# Patient Record
Sex: Male | Born: 1955 | Race: White | Hispanic: No | Smoking: Never smoker
Health system: Southern US, Community
[De-identification: ages and names within clinical notes are randomized; demographics above are authoritative.]

## PROBLEM LIST (undated history)

## (undated) DIAGNOSIS — N529 Male erectile dysfunction, unspecified: Secondary | ICD-10-CM

## (undated) DIAGNOSIS — R310 Gross hematuria: Secondary | ICD-10-CM

## (undated) DIAGNOSIS — E669 Obesity, unspecified: Secondary | ICD-10-CM

## (undated) DIAGNOSIS — I10 Essential (primary) hypertension: Secondary | ICD-10-CM

## (undated) DIAGNOSIS — D126 Benign neoplasm of colon, unspecified: Secondary | ICD-10-CM

## (undated) DIAGNOSIS — R972 Elevated prostate specific antigen [PSA]: Secondary | ICD-10-CM

## (undated) DIAGNOSIS — I2510 Atherosclerotic heart disease of native coronary artery without angina pectoris: Secondary | ICD-10-CM

## (undated) DIAGNOSIS — E785 Hyperlipidemia, unspecified: Secondary | ICD-10-CM

## (undated) DIAGNOSIS — I480 Paroxysmal atrial fibrillation: Secondary | ICD-10-CM

## (undated) DIAGNOSIS — I214 Non-ST elevation (NSTEMI) myocardial infarction: Secondary | ICD-10-CM

## (undated) DIAGNOSIS — M720 Palmar fascial fibromatosis [Dupuytren]: Secondary | ICD-10-CM

## (undated) DIAGNOSIS — E119 Type 2 diabetes mellitus without complications: Secondary | ICD-10-CM

## (undated) DIAGNOSIS — F419 Anxiety disorder, unspecified: Secondary | ICD-10-CM

## (undated) DIAGNOSIS — M65319 Trigger thumb, unspecified thumb: Secondary | ICD-10-CM

## (undated) DIAGNOSIS — Z1211 Encounter for screening for malignant neoplasm of colon: Secondary | ICD-10-CM

## (undated) DIAGNOSIS — N182 Chronic kidney disease, stage 2 (mild): Secondary | ICD-10-CM

## (undated) DIAGNOSIS — E1142 Type 2 diabetes mellitus with diabetic polyneuropathy: Secondary | ICD-10-CM

## (undated) DIAGNOSIS — E66811 Obesity, class 1: Secondary | ICD-10-CM

## (undated) DIAGNOSIS — E118 Type 2 diabetes mellitus with unspecified complications: Secondary | ICD-10-CM

## (undated) HISTORY — DX: Paroxysmal atrial fibrillation: I48.0

## (undated) HISTORY — DX: Obesity, class 1: E66.811

## (undated) HISTORY — DX: Anxiety disorder, unspecified: F41.9

## (undated) HISTORY — DX: Type 2 diabetes mellitus with diabetic polyneuropathy: E11.42

## (undated) HISTORY — DX: Essential (primary) hypertension: I10

## (undated) HISTORY — DX: Atherosclerotic heart disease of native coronary artery without angina pectoris: I25.10

## (undated) HISTORY — PX: TRANSTHORACIC ECHOCARDIOGRAM: SHX275

## (undated) HISTORY — DX: Male erectile dysfunction, unspecified: N52.9

## (undated) HISTORY — DX: Obesity, unspecified: E66.9

## (undated) HISTORY — DX: Hyperlipidemia, unspecified: E78.5

## (undated) HISTORY — DX: Encounter for screening for malignant neoplasm of colon: Z12.11

## (undated) HISTORY — DX: Palmar fascial fibromatosis (dupuytren): M72.0

## (undated) HISTORY — DX: Trigger thumb, unspecified thumb: M65.319

## (undated) HISTORY — DX: Elevated prostate specific antigen (PSA): R97.20

## (undated) HISTORY — DX: Gross hematuria: R31.0

## (undated) HISTORY — DX: Benign neoplasm of colon, unspecified: D12.6

## (undated) HISTORY — DX: Chronic kidney disease, stage 2 (mild): N18.2

---
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
## 1977-08-18 HISTORY — PX: NASAL FRACTURE SURGERY: SHX718

## 1981-08-18 HISTORY — PX: SHOULDER SURGERY: SHX246

## 2001-04-05 ENCOUNTER — Emergency Department (HOSPITAL_COMMUNITY): Admission: EM | Admit: 2001-04-05 | Discharge: 2001-04-05 | Payer: Self-pay | Admitting: Emergency Medicine

## 2001-04-05 ENCOUNTER — Encounter: Payer: Self-pay | Admitting: Emergency Medicine

## 2006-04-07 ENCOUNTER — Emergency Department (HOSPITAL_COMMUNITY): Admission: EM | Admit: 2006-04-07 | Discharge: 2006-04-07 | Payer: Self-pay | Admitting: Emergency Medicine

## 2013-07-18 DIAGNOSIS — E118 Type 2 diabetes mellitus with unspecified complications: Secondary | ICD-10-CM

## 2013-07-18 DIAGNOSIS — I480 Paroxysmal atrial fibrillation: Secondary | ICD-10-CM

## 2013-07-18 DIAGNOSIS — I214 Non-ST elevation (NSTEMI) myocardial infarction: Secondary | ICD-10-CM

## 2013-07-18 HISTORY — DX: Paroxysmal atrial fibrillation: I48.0

## 2013-07-18 HISTORY — PX: TRANSTHORACIC ECHOCARDIOGRAM: SHX275

## 2013-07-18 HISTORY — DX: Type 2 diabetes mellitus with unspecified complications: E11.8

## 2013-07-18 HISTORY — DX: Non-ST elevation (NSTEMI) myocardial infarction: I21.4

## 2013-07-29 ENCOUNTER — Emergency Department (HOSPITAL_COMMUNITY): Payer: Self-pay

## 2013-07-29 ENCOUNTER — Inpatient Hospital Stay (HOSPITAL_COMMUNITY)
Admission: EM | Admit: 2013-07-29 | Discharge: 2013-08-08 | DRG: 234 | Disposition: A | Discharge status: Home / Self Care | Payer: Self-pay | Attending: Cardiothoracic Surgery | Admitting: Cardiothoracic Surgery

## 2013-07-29 ENCOUNTER — Encounter (HOSPITAL_COMMUNITY): Payer: Self-pay | Admitting: Emergency Medicine

## 2013-07-29 DIAGNOSIS — Z87891 Personal history of nicotine dependence: Secondary | ICD-10-CM

## 2013-07-29 DIAGNOSIS — D62 Acute posthemorrhagic anemia: Secondary | ICD-10-CM | POA: Diagnosis not present

## 2013-07-29 DIAGNOSIS — I2510 Atherosclerotic heart disease of native coronary artery without angina pectoris: Secondary | ICD-10-CM | POA: Diagnosis present

## 2013-07-29 DIAGNOSIS — Z7982 Long term (current) use of aspirin: Secondary | ICD-10-CM

## 2013-07-29 DIAGNOSIS — I214 Non-ST elevation (NSTEMI) myocardial infarction: Secondary | ICD-10-CM

## 2013-07-29 DIAGNOSIS — I10 Essential (primary) hypertension: Secondary | ICD-10-CM

## 2013-07-29 DIAGNOSIS — I4891 Unspecified atrial fibrillation: Secondary | ICD-10-CM | POA: Diagnosis not present

## 2013-07-29 DIAGNOSIS — R9431 Abnormal electrocardiogram [ECG] [EKG]: Secondary | ICD-10-CM

## 2013-07-29 DIAGNOSIS — I498 Other specified cardiac arrhythmias: Secondary | ICD-10-CM | POA: Diagnosis not present

## 2013-07-29 DIAGNOSIS — E785 Hyperlipidemia, unspecified: Secondary | ICD-10-CM | POA: Diagnosis present

## 2013-07-29 DIAGNOSIS — IMO0001 Reserved for inherently not codable concepts without codable children: Secondary | ICD-10-CM | POA: Diagnosis present

## 2013-07-29 DIAGNOSIS — E119 Type 2 diabetes mellitus without complications: Secondary | ICD-10-CM

## 2013-07-29 DIAGNOSIS — I059 Rheumatic mitral valve disease, unspecified: Secondary | ICD-10-CM | POA: Diagnosis present

## 2013-07-29 DIAGNOSIS — K5900 Constipation, unspecified: Secondary | ICD-10-CM | POA: Diagnosis not present

## 2013-07-29 DIAGNOSIS — Z951 Presence of aortocoronary bypass graft: Secondary | ICD-10-CM

## 2013-07-29 DIAGNOSIS — R931 Abnormal findings on diagnostic imaging of heart and coronary circulation: Secondary | ICD-10-CM

## 2013-07-29 DIAGNOSIS — R0989 Other specified symptoms and signs involving the circulatory and respiratory systems: Secondary | ICD-10-CM

## 2013-07-29 DIAGNOSIS — I517 Cardiomegaly: Secondary | ICD-10-CM

## 2013-07-29 HISTORY — DX: Non-ST elevation (NSTEMI) myocardial infarction: I21.4

## 2013-07-29 HISTORY — DX: Type 2 diabetes mellitus without complications: E11.9

## 2013-07-29 LAB — CBC WITH DIFFERENTIAL/PLATELET
Basophils Absolute: 0 10*3/uL (ref 0.0–0.1)
Basophils Relative: 0 % (ref 0–1)
Eosinophils Absolute: 0.1 10*3/uL (ref 0.0–0.7)
Eosinophils Relative: 1 % (ref 0–5)
HCT: 50.4 % (ref 39.0–52.0)
Hemoglobin: 17.8 g/dL — ABNORMAL HIGH (ref 13.0–17.0)
Lymphocytes Relative: 18 % (ref 12–46)
Lymphs Abs: 1.9 10*3/uL (ref 0.7–4.0)
MCH: 31.6 pg (ref 26.0–34.0)
MCHC: 35.3 g/dL (ref 30.0–36.0)
MCV: 89.4 fL (ref 78.0–100.0)
Monocytes Absolute: 0.6 10*3/uL (ref 0.1–1.0)
Monocytes Relative: 6 % (ref 3–12)
Neutro Abs: 8 10*3/uL — ABNORMAL HIGH (ref 1.7–7.7)
Neutrophils Relative %: 75 % (ref 43–77)
Platelets: 217 10*3/uL (ref 150–400)
RBC: 5.64 MIL/uL (ref 4.22–5.81)
RDW: 12.1 % (ref 11.5–15.5)
WBC: 10.7 10*3/uL — ABNORMAL HIGH (ref 4.0–10.5)

## 2013-07-29 LAB — COMPREHENSIVE METABOLIC PANEL
ALT: 56 U/L — ABNORMAL HIGH (ref 0–53)
AST: 41 U/L — ABNORMAL HIGH (ref 0–37)
Albumin: 4 g/dL (ref 3.5–5.2)
Alkaline Phosphatase: 90 U/L (ref 39–117)
BUN: 19 mg/dL (ref 6–23)
CO2: 24 mEq/L (ref 19–32)
Calcium: 9.7 mg/dL (ref 8.4–10.5)
Chloride: 94 mEq/L — ABNORMAL LOW (ref 96–112)
Creatinine, Ser: 0.9 mg/dL (ref 0.50–1.35)
GFR calc Af Amer: >90 mL/min (ref >90)
GFR calc non Af Amer: >90 mL/min (ref >90)
Glucose, Bld: 333 mg/dL — ABNORMAL HIGH (ref 70–99)
Potassium: 4 mEq/L (ref 3.5–5.1)
Sodium: 132 mEq/L — ABNORMAL LOW (ref 135–145)
Total Bilirubin: 0.5 mg/dL (ref 0.3–1.2)
Total Protein: 7.7 g/dL (ref 6.0–8.3)

## 2013-07-29 LAB — TROPONIN I: Troponin I: 1.09 ng/mL (ref <0.30)

## 2013-07-29 LAB — D-DIMER, QUANTITATIVE: D-Dimer, Quant: 0.28 ug/mL-FEU (ref 0.00–0.48)

## 2013-07-29 MED ORDER — ASPIRIN EC 81 MG PO TBEC
81.0000 mg | DELAYED_RELEASE_TABLET | Freq: Every day | ORAL | Status: DC
  Administered 2013-07-30 – 2013-08-01 (×3): 81 mg via ORAL
  Filled 2013-07-29 (×4): qty 1

## 2013-07-29 MED ORDER — HEPARIN BOLUS VIA INFUSION
4000.0000 [IU] | Freq: Once | INTRAVENOUS | Status: AC
  Administered 2013-07-29: 4000 [IU] via INTRAVENOUS

## 2013-07-29 MED ORDER — METOPROLOL TARTRATE 25 MG PO TABS
25.0000 mg | ORAL_TABLET | Freq: Two times a day (BID) | ORAL | Status: DC
  Administered 2013-07-29 – 2013-08-01 (×7): 25 mg via ORAL
  Filled 2013-07-29 (×9): qty 1

## 2013-07-29 MED ORDER — ONDANSETRON HCL 4 MG/2ML IJ SOLN
4.0000 mg | Freq: Three times a day (TID) | INTRAMUSCULAR | Status: AC | PRN
  Administered 2013-07-29: 4 mg via INTRAVENOUS
  Filled 2013-07-29: qty 2

## 2013-07-29 MED ORDER — ATORVASTATIN CALCIUM 40 MG PO TABS
40.0000 mg | ORAL_TABLET | Freq: Every day | ORAL | Status: DC
  Administered 2013-07-30 – 2013-07-31 (×2): 40 mg via ORAL
  Filled 2013-07-29 (×4): qty 1

## 2013-07-29 MED ORDER — NITROGLYCERIN 0.4 MG SL SUBL
0.4000 mg | SUBLINGUAL_TABLET | SUBLINGUAL | Status: DC | PRN
  Administered 2013-07-29 – 2013-07-30 (×3): 0.4 mg via SUBLINGUAL

## 2013-07-29 MED ORDER — ASPIRIN 81 MG PO CHEW
324.0000 mg | CHEWABLE_TABLET | Freq: Once | ORAL | Status: AC
  Administered 2013-07-29: 324 mg via ORAL
  Filled 2013-07-29: qty 4

## 2013-07-29 MED ORDER — NITROGLYCERIN 0.4 MG SL SUBL: 0.4000 mg | SUBLINGUAL_TABLET | SUBLINGUAL | Status: DC | PRN

## 2013-07-29 MED ORDER — ONDANSETRON HCL 4 MG/2ML IJ SOLN: 4.0000 mg | Freq: Four times a day (QID) | INTRAMUSCULAR | Status: DC | PRN

## 2013-07-29 MED ORDER — HEPARIN (PORCINE) IN NACL 100-0.45 UNIT/ML-% IJ SOLN
1600.0000 [IU]/h | INTRAMUSCULAR | Status: DC
  Administered 2013-07-29: 1400 [IU]/h via INTRAVENOUS
  Administered 2013-07-30: 1600 [IU]/h via INTRAVENOUS
  Filled 2013-07-29 (×4): qty 250

## 2013-07-29 MED ORDER — ASPIRIN 325 MG PO TABS: 650.0000 mg | ORAL_TABLET | Freq: Once | ORAL | Status: AC | PRN

## 2013-07-29 MED ORDER — NITROGLYCERIN 0.4 MG SL SUBL
0.4000 mg | SUBLINGUAL_TABLET | SUBLINGUAL | Status: DC | PRN
  Administered 2013-07-29: 0.4 mg via SUBLINGUAL
  Filled 2013-07-29: qty 25

## 2013-07-29 MED ORDER — ACETAMINOPHEN 325 MG PO TABS
650.0000 mg | ORAL_TABLET | ORAL | Status: DC | PRN
  Administered 2013-07-29 – 2013-08-01 (×3): 650 mg via ORAL
  Filled 2013-07-29 (×3): qty 2

## 2013-07-29 MED ORDER — NITROGLYCERIN IN D5W 200-5 MCG/ML-% IV SOLN
5.0000 ug/min | INTRAVENOUS | Status: DC
  Administered 2013-07-29: 5 ug/min via INTRAVENOUS
  Administered 2013-07-31: 20 ug/min via INTRAVENOUS
  Filled 2013-07-29 (×2): qty 250

## 2013-07-29 MED ORDER — SODIUM CHLORIDE 0.9 % IV SOLN
INTRAVENOUS | Status: DC | PRN
  Administered 2013-07-29: 10 mL/h via INTRAVENOUS

## 2013-07-29 MED ORDER — MORPHINE SULFATE 4 MG/ML IJ SOLN
4.0000 mg | Freq: Once | INTRAMUSCULAR | Status: AC
  Administered 2013-07-29: 4 mg via INTRAVENOUS
  Filled 2013-07-29: qty 1

## 2013-07-29 NOTE — Progress Notes (Signed)
ANTICOAGULATION CONSULT NOTE - Initial Consult  Pharmacy Consult for Heparin Indication: chest pain/ACS  No Known Allergies  Patient Measurements: Height: 6\' 2"  (188 cm) Weight: 224 lb (101.606 kg) IBW/kg (Calculated) : 82.2 Heparin Dosing Weight: 101.6 kg  Vital Signs: Temp: 97.9 F (36.6 C) (12/12 1511) Temp src: Oral (12/12 1511) BP: 165/119 mmHg (12/12 1511) Pulse Rate: 92 (12/12 1511)  Labs:  Recent Labs  07/29/13 1553  HGB 17.8*  HCT 50.4  PLT 217  CREATININE 0.90  TROPONINI 1.09*    Estimated Creatinine Clearance: 112.5 ml/min (by C-G formula based on Cr of 0.9).   Medical History: History reviewed. No pertinent past medical history.  Medications:  Scheduled:    Assessment: Chest pain Elevated troponin Labs reviewed PTA medications reviewed  Goal of Therapy:  Heparin level 0.3-0.7 units/ml Monitor platelets by anticoagulation protocol: Yes   Plan:  Give 4000 units bolus x 1 Start heparin infusion at 1400 units/hr Check anti-Xa level in 6 hours and daily while on heparin Continue to monitor H&H and platelets  Raquel James, Emad Brechtel Bennett 07/29/2013,4:54 PM

## 2013-07-29 NOTE — H&P (Addendum)
Admit date: 07/29/2013 Referring Physician Dr. Manus Gunning Primary Cardiologist Dr. Elease Hashimoto Chief complaint/reason for admission: Chest pain  HPI: Alex Yang is a 57 y.o. male who presents to the Emergency Department complaining of central CP that radiates to the bilateral axillas that has been intermittent for the past 2 weeks. He states that he became concerned today when the pain came on suddenly under the bilateral axilla areas while watching TV and lasted for 5 minutes, the longest episode yet. He reports associated "clamminess" during that episode. He denies that exertion is a trigger and denies that the pain happens daily. He states that heavy lifting will trigger the pain such as heavy groceries. He states that currently he is feeling a soreness that he rates as a 1/10.  He reports that he has taken 2 81 mg ASA today with minimal improvement. He denies SOB, emesis, abdominal pain, back pain or nausea. He denies having a h/o cardiac or pulmonary problems, HTN, HLD and DM. He denies having any prior stress test or cardiac catheterizations. He states that he was exposed to passive smoke for several years but denies being a smoker. He was found to have an elevated troponin and is now admitted with a NSTEMI.       PMH:   History reviewed. No pertinent past medical history.  PSH:    Past Surgical History  Procedure Laterality Date  . Fracture surgery      left arm  . Nasal fracture surgery      ALLERGIES:   Review of patient's allergies indicates no known allergies.  Prior to Admit Meds:   Prescriptions prior to admission  Medication Sig Dispense Refill  . aspirin 325 MG tablet Take 650 mg by mouth once as needed for mild pain, moderate pain or headache.      . L-ARGININE PO Take 1 tablet by mouth daily.      . nitroGLYCERIN (NITROSTAT) 0.4 MG SL tablet Place 0.4 mg under the tongue every 5 (five) minutes as needed for chest pain.       Family HX:    Family History  Problem Relation  Age of Onset  . Heart attack Mother   . Heart disease Mother   . Heart disease Father    Social HX:    History   Social History  . Marital Status: Single    Spouse Name: N/A    Number of Children: N/A  . Years of Education: N/A   Occupational History  . Not on file.   Social History Main Topics  . Smoking status: Never Smoker   . Smokeless tobacco: Not on file  . Alcohol Use: No  . Drug Use: No  . Sexual Activity: Not on file   Other Topics Concern  . Not on file   Social History Narrative  . No narrative on file     ROS:  All 11 ROS were addressed and are negative except what is stated in the HPI  PHYSICAL EXAM Filed Vitals:   07/29/13 2300  BP: 162/100  Pulse: 90  Temp:   Resp: 16   General: Well developed, well nourished, in no acute distress Head: Eyes PERRLA, No xanthomas.   Normal cephalic and atramatic  Lungs:   Clear bilaterally to auscultation and percussion. Heart:   HRRR S1 S2 Pulses are 2+ & equal.            No carotid bruit. No JVD.  No abdominal bruits. No femoral bruits. Abdomen: Bowel  sounds are positive, abdomen soft and non-tender without masses \\Extremities :   No clubbing, cyanosis or edema.  DP +1 Neuro: Alert and oriented X 3. Psych:  Good affect, responds appropriately   Labs:   Lab Results  Component Value Date   WBC 10.7* 07/29/2013   HGB 17.8* 07/29/2013   HCT 50.4 07/29/2013   MCV 89.4 07/29/2013   PLT 217 07/29/2013    Recent Labs Lab 07/29/13 1553  NA 132*  K 4.0  CL 94*  CO2 24  BUN 19  CREATININE 0.90  CALCIUM 9.7  PROT 7.7  BILITOT 0.5  ALKPHOS 90  ALT 56*  AST 41*  GLUCOSE 333*   Lab Results  Component Value Date   TROPONINI 1.09* 07/29/2013     Radiology:  CLINICAL DATA: Chest pain  EXAM:  CHEST 2 VIEW  COMPARISON: None.  FINDINGS:  The heart size and mediastinal contours are within normal limits.  Both lungs are clear. The patient is status post prior surgery of  left shoulder. Degenerative  joint changes of the spine are noted.  IMPRESSION:  No active cardiopulmonary disease.  Electronically Signed  By: Sherian Rein M.D.  On: 07/29/2013 16:05   EKG:  NSR with diffuse ST depression in the anterior and inferior leads  ASSESSMENT:  1.  NSTEMI currently with minimal pain 2.  HTN newly diagnosed today 3.  Remote tobacco use 4.  Hyperglycemia with no history of DM  PLAN:   1.  Admit to CCU 2.  Cycle cardiac enzymes until they peak 3.  IV Heparin gtt 4.  IV NTG and titrate to keep SBP <197mmHg 5.  ASA 6.  2D echo in am to assess LVF 7.  Check HbA1C 8.  Start Lopressor 25mg  BID 9.  Start Lipitor 40mg  daily   Quintella Reichert, MD  07/29/2013  11:29 PM

## 2013-07-29 NOTE — ED Notes (Signed)
CRITICAL VALUE ALERT  Critical value received:  trop  Date of notification:  07/29/13  Time of notification:  1647  Critical value read back: yes  Nurse who received alert: j.Lorrin Bodner rn  MD notified (1st page):  rancor  Time of first page:  1647  MD notified (2nd page):  Time of second page:  Responding MD:  rancour  Time MD responded:  [phone]

## 2013-07-29 NOTE — ED Notes (Addendum)
Ant chest pain, no change with movement or deep breath.  Pain  is intermittent for 2 weeks.Marland Kitchen

## 2013-07-29 NOTE — ED Provider Notes (Signed)
CSN: [id number]     Arrival date & time 07/29/13  1507 History  This chart was scribed for Glynn Octave, MD by Bennett Scrape, ED Scribe. This patient was seen in room [facility] and the patient's care was started at 3:27 PM.   Chief Complaint  Patient presents with  . Chest Pain    The history is provided by the patient. No language interpreter was used.    HPI Comments: Alex Yang is a 57 y.o. male who presents to the Emergency Department complaining of central CP that radiates to the bilateral axillas that has been intermittent for the past 2 weeks. He states that he became concerned today when the pain came on suddenly under the bilateral axilla areas while watching TV and lasted for 5 minutes, the longest episode yet. He reports associated "clamminess" during that episode. He denies that exertion is a trigger and denies that the pain happens daily. He states that heavy lifting will trigger the pain such as heavy groceries. He states that currently he is feeling a soreness that is not the same quality or intensity as the complaint. He reports that he has taken 2 81 mg ASA today with minimal improvement. He denies SOB, emesis, abdominal pain, back pain or nausea. He denies having a h/o cardiac or pulmonary problems, HTN, HLD and DM. He denies having any prior stress test or cardiac catheterizations. He states that he was exposed to passive smoke for several years but denies being a smoker.   History reviewed. No pertinent past medical history. Past Surgical History  Procedure Laterality Date  . Fracture surgery    . Nasal fracture surgery     History reviewed. No pertinent family history. History  Substance Use Topics  . Smoking status: Never Smoker   . Smokeless tobacco: Not on file  . Alcohol Use: Yes    Review of Systems  A complete 10 system review of systems was obtained and all systems are negative except as noted in the HPI and PMH.   Allergies  Review of  patient's allergies indicates no known allergies.  Home Medications   Current Outpatient Rx  Name  Route  Sig  Dispense  Refill  . aspirin 325 MG tablet   Oral   Take 650 mg by mouth once as needed for mild pain, moderate pain or headache.         . L-ARGININE PO   Oral   Take 1 tablet by mouth daily.         . nitroGLYCERIN (NITROSTAT) 0.4 MG SL tablet   Sublingual   Place 0.4 mg under the tongue every 5 (five) minutes as needed for chest pain.           Triage Vitals: BP 165/119  Pulse 92  Temp(Src) 97.9 F (36.6 C) (Oral)  Resp 20  Ht 6\' 2"  (1.88 m)  Wt 224 lb (101.606 kg)  BMI 28.75 kg/m2  SpO2 100%  Physical Exam  Nursing note and vitals reviewed. Constitutional: He is oriented to person, place, and time. He appears well-developed and well-nourished. No distress.  HENT:  Head: Normocephalic and atraumatic.  Eyes: EOM are normal.  Neck: Neck supple. No tracheal deviation present.  Cardiovascular: Normal rate and regular rhythm.   Pulmonary/Chest: Effort normal and breath sounds normal. No respiratory distress. He exhibits no tenderness.  Chest pain is not reproducible  Abdominal: Soft. There is no tenderness.  Musculoskeletal: Normal range of motion.  Intact peripheral pulses, no peripheral  edema  Neurological: He is alert and oriented to person, place, and time.  5/5 strength throughout, equal grip strengths   Skin: Skin is warm and dry.  Psychiatric: He has a normal mood and affect. His behavior is normal.    ED Course  Procedures (including critical care time)  Medications  nitroGLYCERIN (NITROSTAT) SL tablet 0.4 mg (0.4 mg Sublingual Given 07/29/13 1612)  nitroGLYCERIN 0.2 mg/mL in dextrose 5 % infusion (10 mcg/min Intravenous Rate/Dose Change 07/29/13 1742)  heparin ADULT infusion 100 units/mL (25000 units/250 mL) (1,400 Units/hr Intravenous New Bag/Given 07/29/13 1709)  ondansetron (ZOFRAN) injection 4 mg (4 mg Intravenous Given 07/29/13 1800)   heparin bolus via infusion 4,000 Units (0 Units Intravenous Stopped 07/29/13 1821)  aspirin chewable tablet 324 mg (324 mg Oral Given 07/29/13 1731)  morphine 4 MG/ML injection 4 mg (4 mg Intravenous Given 07/29/13 1800)    DIAGNOSTIC STUDIES: Oxygen Saturation is 100% on room air, normal by my interpretation.    COORDINATION OF CARE: 3:32 PM-Discussed treatment plan which includes NTG, CXR, CBC panel, CMP and troponin with pt at bedside and pt agreed to plan.   Labs Review Labs Reviewed  CBC WITH DIFFERENTIAL - Abnormal; Notable for the following:    WBC 10.7 (*)    Hemoglobin 17.8 (*)    Neutro Abs 8.0 (*)    All other components within normal limits  COMPREHENSIVE METABOLIC PANEL - Abnormal; Notable for the following:    Sodium 132 (*)    Chloride 94 (*)    Glucose, Bld 333 (*)    AST 41 (*)    ALT 56 (*)    All other components within normal limits  TROPONIN I - Abnormal; Notable for the following:    Troponin I 1.09 (*)    All other components within normal limits  D-DIMER, QUANTITATIVE  HEPARIN LEVEL (UNFRACTIONATED)  HEPARIN LEVEL (UNFRACTIONATED)   Imaging Review Dg Chest 2 View  07/29/2013   CLINICAL DATA:  Chest pain  EXAM: CHEST  2 VIEW  COMPARISON:  None.  FINDINGS: The heart size and mediastinal contours are within normal limits. Both lungs are clear. The patient is status post prior surgery of left shoulder. Degenerative joint changes of the spine are noted.  IMPRESSION: No active cardiopulmonary disease.   Electronically Signed   By: Sherian Rein M.D.   On: 07/29/2013 16:05    EKG Interpretation    Date/Time:  Friday July 29 2013 15:13:48 EST Ventricular Rate:  97 PR Interval:  166 QRS Duration: 92 QT Interval:  364 QTC Calculation: 462 R Axis:   48 Text Interpretation:  Normal sinus rhythm ST depression, consider subendocardial injury Abnormal ECG No previous ECGs available ST depressions anterior laterally Confirmed by Manus Gunning  MD, Surena Welge ([id number])  on 07/29/2013 3:47:22 PM            MDM   1. NSTEMI (non-ST elevated myocardial infarction)   2. EKG abnormality    2 week history of intermittent left chest and axillary pain that lasts a few minutes at a time. Today when this episode was 5 minutes at rest. Associated with diaphoresis and clamminess. No shortness of breath, nausea vomiting. No cardiac history. Patient took aspirin at home. EKG shows ST depressions anterolaterally. No comparison. Hyperglycemia of 333 without history of diabetes. Normal anion gap.  Troponin elevated at 1.09. He started on IV heparin and IV nitroglycerin. D-dimer is negative. Discussed with Dr. Elease Hashimoto who accepts patient to [facility] step down unit for NSTEMI.  CRITICAL CARE Performed by: Glynn Octave Total critical care time: 30 Critical care time was exclusive of separately billable procedures and treating other patients. Critical care was necessary to treat or prevent imminent or life-threatening deterioration. Critical care was time spent personally by me on the following activities: development of treatment plan with patient and/or surrogate as well as nursing, discussions with consultants, evaluation of patient's response to treatment, examination of patient, obtaining history from patient or surrogate, ordering and performing treatments and interventions, ordering and review of laboratory studies, ordering and review of radiographic studies, pulse oximetry and re-evaluation of patient's condition.   I personally performed the services described in this documentation, which was scribed in my presence. The recorded information has been reviewed and is accurate.     Glynn Octave, MD 07/29/13 2214

## 2013-07-29 NOTE — ED Notes (Signed)
Patient transported to X-ray 

## 2013-07-30 DIAGNOSIS — I059 Rheumatic mitral valve disease, unspecified: Secondary | ICD-10-CM

## 2013-07-30 DIAGNOSIS — I214 Non-ST elevation (NSTEMI) myocardial infarction: Secondary | ICD-10-CM

## 2013-07-30 DIAGNOSIS — I10 Essential (primary) hypertension: Secondary | ICD-10-CM

## 2013-07-30 LAB — BASIC METABOLIC PANEL
BUN: 21 mg/dL (ref 6–23)
CO2: 30 mEq/L (ref 19–32)
Calcium: 8.7 mg/dL (ref 8.4–10.5)
Chloride: 98 mEq/L (ref 96–112)
Creatinine, Ser: 0.97 mg/dL (ref 0.50–1.35)
GFR calc Af Amer: >90 mL/min (ref >90)
GFR calc non Af Amer: 89 mL/min — ABNORMAL LOW (ref >90)
Glucose, Bld: 243 mg/dL — ABNORMAL HIGH (ref 70–99)
Potassium: 3.9 mEq/L (ref 3.5–5.1)
Sodium: 136 mEq/L (ref 135–145)

## 2013-07-30 LAB — CBC
HCT: 46 % (ref 39.0–52.0)
Hemoglobin: 16.4 g/dL (ref 13.0–17.0)
MCH: 32.2 pg (ref 26.0–34.0)
MCHC: 35.7 g/dL (ref 30.0–36.0)
MCV: 90.4 fL (ref 78.0–100.0)
Platelets: 252 10*3/uL (ref 150–400)
RBC: 5.09 MIL/uL (ref 4.22–5.81)
RDW: 12.1 % (ref 11.5–15.5)
WBC: 9.6 10*3/uL (ref 4.0–10.5)

## 2013-07-30 LAB — LIPID PANEL
Cholesterol: 162 mg/dL (ref 0–200)
HDL: 46 mg/dL (ref >39)
LDL Cholesterol: 92 mg/dL (ref 0–99)
Total CHOL/HDL Ratio: 3.5 RATIO
Triglycerides: 122 mg/dL (ref <150)
VLDL: 24 mg/dL (ref 0–40)

## 2013-07-30 LAB — PROTIME-INR
INR: 1.08 (ref 0.00–1.49)
Prothrombin Time: 13.8 seconds (ref 11.6–15.2)

## 2013-07-30 LAB — APTT: aPTT: 100 seconds — ABNORMAL HIGH (ref 24–37)

## 2013-07-30 LAB — HEMOGLOBIN A1C
Hgb A1c MFr Bld: 12.3 % — ABNORMAL HIGH (ref <5.7)
Mean Plasma Glucose: 306 mg/dL — ABNORMAL HIGH (ref <117)

## 2013-07-30 LAB — TROPONIN I
Troponin I: 5.6 ng/mL (ref <0.30)
Troponin I: 3.89 ng/mL (ref <0.30)

## 2013-07-30 LAB — GLUCOSE, CAPILLARY
Glucose-Capillary: 290 mg/dL — ABNORMAL HIGH (ref 70–99)
Glucose-Capillary: 297 mg/dL — ABNORMAL HIGH (ref 70–99)
Glucose-Capillary: 261 mg/dL — ABNORMAL HIGH (ref 70–99)
Glucose-Capillary: 228 mg/dL — ABNORMAL HIGH (ref 70–99)

## 2013-07-30 LAB — HEPARIN LEVEL (UNFRACTIONATED)
Heparin Unfractionated: 0.26 IU/mL — ABNORMAL LOW (ref 0.30–0.70)
Heparin Unfractionated: 0.34 IU/mL (ref 0.30–0.70)
Heparin Unfractionated: 0.55 IU/mL (ref 0.30–0.70)

## 2013-07-30 LAB — MRSA PCR SCREENING: MRSA by PCR: NEGATIVE

## 2013-07-30 LAB — TSH: TSH: 2.457 u[IU]/mL (ref 0.350–4.500)

## 2013-07-30 MED ORDER — HEPARIN (PORCINE) IN NACL 100-0.45 UNIT/ML-% IJ SOLN
1700.0000 [IU]/h | INTRAMUSCULAR | Status: DC
  Administered 2013-07-30: 1750 [IU]/h via INTRAVENOUS
  Administered 2013-08-01: 1700 [IU]/h via INTRAVENOUS
  Filled 2013-07-30 (×5): qty 250

## 2013-07-30 MED ORDER — DIPHENHYDRAMINE HCL 25 MG PO CAPS
25.0000 mg | ORAL_CAPSULE | Freq: Four times a day (QID) | ORAL | Status: DC | PRN
  Administered 2013-07-30: 25 mg via ORAL
  Filled 2013-07-30: qty 1

## 2013-07-30 MED ORDER — INSULIN ASPART 100 UNIT/ML ~~LOC~~ SOLN
0.0000 [IU] | Freq: Three times a day (TID) | SUBCUTANEOUS | Status: DC
  Administered 2013-07-30 (×2): 8 [IU] via SUBCUTANEOUS
  Administered 2013-07-31: 5 [IU] via SUBCUTANEOUS
  Administered 2013-07-31 (×2): 8 [IU] via SUBCUTANEOUS
  Administered 2013-08-01: 5 [IU] via SUBCUTANEOUS
  Administered 2013-08-01: 3 [IU] via SUBCUTANEOUS
  Administered 2013-08-01: 5 [IU] via SUBCUTANEOUS

## 2013-07-30 NOTE — Progress Notes (Signed)
Echocardiogram 2D Echocardiogram has been performed.  Alex Yang 07/30/2013, 9:38 AM

## 2013-07-30 NOTE — Progress Notes (Signed)
ANTICOAGULATION CONSULT NOTE - Follow Up Consult  Pharmacy Consult for Heparin  Indication: chest pain/ACS  No Known Allergies  Patient Measurements: Height: 6\' 2"  (188 cm) Weight: 223 lb 15.8 oz (101.6 kg) IBW/kg (Calculated) : 82.2  Vital Signs: Temp: 98.3 F (36.8 C) (12/13 0000) Temp src: Oral (12/13 0000) BP: 111/75 mmHg (12/13 0100) Pulse Rate: 82 (12/13 0100)  Labs:  Recent Labs  07/29/13 1553 07/30/13 0040  HGB 17.8* 16.4  HCT 50.4 46.0  PLT 217 252  LABPROT  --  13.8  INR  --  1.08  HEPARINUNFRC  --  0.26*  CREATININE 0.90  --   TROPONINI 1.09*  --    Estimated Creatinine Clearance: 112.5 ml/min (by C-G formula based on Cr of 0.9).  Medications:  Heparin 1400 units/hr  Assessment: 57 y/o M tx from [facility] with CP, heparin running at 1400 units/hr, first HL is 0.26, no issues per RN, other labs above.   Goal of Therapy:  Heparin level 0.3-0.7 units/ml Monitor platelets by anticoagulation protocol: Yes   Plan:  -Increase heparin drip to 1600 units/hr -0800 HL -Daily CBC/HL -Monitor for bleeding  Thank you for allowing me to take part in this patient's care,  Abran Duke, PharmD Clinical Pharmacist Phone: [phone] Pager: [phone] 07/30/2013 1:51 AM

## 2013-07-30 NOTE — Progress Notes (Signed)
SUBJECTIVE: The patient is doing well today.  Chest pain is resolved with ntg He denies SOB presently.   Marland Kitchen aspirin EC  81 mg Oral Daily  . atorvastatin  40 mg Oral q1800  . metoprolol tartrate  25 mg Oral BID   . sodium chloride 10 mL/hr (07/29/13 2330)  . heparin 1,600 Units/hr (07/30/13 4098)  . nitroGLYCERIN 20 mcg/min (07/30/13 0600)    OBJECTIVE: Physical Exam: Filed Vitals:   07/30/13 0500 07/30/13 0600 07/30/13 0700 07/30/13 0705  BP: 106/74 90/58 116/75 102/78  Pulse: 75 74 79 83  Temp:    97.9 F (36.6 C)  TempSrc:    Oral  Resp: 19 17 20 19   Height:      Weight:      SpO2: 98% 97% 99% 97%    Intake/Output Summary (Last 24 hours) at 07/30/13 0930 Last data filed at 07/30/13 0600  Gross per 24 hour  Intake 409.18 ml  Output      0 ml  Net 409.18 ml    Telemetry reveals sinus rhythm  GEN- The patient is well appearing, alert and oriented x 3 today.   Head- normocephalic, atraumatic Eyes-  Sclera clear, conjunctiva pink Ears- hearing intact Oropharynx- clear Neck- supple, no JVP Lymph- no cervical lymphadenopathy Lungs- Clear to ausculation bilaterally, normal work of breathing Heart- Regular rate and rhythm, no murmurs, rubs or gallops, PMI not laterally displaced GI- soft, NT, ND, + BS Extremities- no clubbing, cyanosis, or edema Skin- no rash or lesion Psych- euthymic mood, full affect Neuro- strength and sensation are intact  ekg today reveals sinus rhythm, ST depression is less prominent  LABS: Basic Metabolic Panel:  Recent Labs  11/91/47 1553 07/30/13 0040  NA 132* 136  K 4.0 3.9  CL 94* 98  CO2 24 30  GLUCOSE 333* 243*  BUN 19 21  CREATININE 0.90 0.97  CALCIUM 9.7 8.7   Liver Function Tests:  Recent Labs  07/29/13 1553  AST 41*  ALT 56*  ALKPHOS 90  BILITOT 0.5  PROT 7.7  ALBUMIN 4.0   No results found for this basename: LIPASE, AMYLASE,  in the last 72 hours CBC:  Recent Labs  07/29/13 1553 07/30/13 0040    WBC 10.7* 9.6  NEUTROABS 8.0*  --   HGB 17.8* 16.4  HCT 50.4 46.0  MCV 89.4 90.4  PLT 217 252   Cardiac Enzymes:  Recent Labs  07/29/13 1553 07/30/13 0520  TROPONINI 1.09* 5.60*   BNP: No components found with this basename: POCBNP,  D-Dimer:  Recent Labs  07/29/13 1722  DDIMER 0.28   Hemoglobin A1C: No results found for this basename: HGBA1C,  in the last 72 hours Fasting Lipid Panel:  Recent Labs  07/30/13 0040  CHOL 162  HDL 46  LDLCALC 92  TRIG 122  CHOLHDL 3.5   Thyroid Function Tests: No results found for this basename: TSH, T4TOTAL, FREET3, T3FREE, THYROIDAB,  in the last 72 hours Anemia Panel: No results found for this basename: VITAMINB12, FOLATE, FERRITIN, TIBC, IRON, RETICCTPCT,  in the last 72 hours  RADIOLOGY: Dg Chest 2 View  07/29/2013   CLINICAL DATA:  Chest pain  EXAM: CHEST  2 VIEW  COMPARISON:  None.  FINDINGS: The heart size and mediastinal contours are within normal limits. Both lungs are clear. The patient is status post prior surgery of left shoulder. Degenerative joint changes of the spine are noted.  IMPRESSION: No active cardiopulmonary disease.   Electronically Signed  By: Sherian Rein M.D.   On: 07/29/2013 16:05    ASSESSMENT AND PLAN:  Active Problems:   NSTEMI (non-ST elevated myocardial infarction) 1. NSTEMI Stable on heparin/ nitro Will keep in stepdown until Tn peaks then transfer to telemetry if stable (later today or in am).   Will need cath on Monday If he decompensates then may need cath sooner  2. HTN Stable  Hillis Range, MD 07/30/2013 9:30 AM

## 2013-07-30 NOTE — Progress Notes (Signed)
ANTICOAGULATION CONSULT NOTE - Follow Up Consult  Pharmacy Consult for Heparin Indication: chest pain/ACS  No Known Allergies  Patient Measurements: Height: 6\' 2"  (188 cm) Weight: 223 lb 15.8 oz (101.6 kg) IBW/kg (Calculated) : 82.2 Heparin Dosing Weight: 102kg  Vital Signs: Temp: 98.3 F (36.8 C) (12/13 1600) Temp src: Oral (12/13 1600) BP: 129/70 mmHg (12/13 1600) Pulse Rate: 84 (12/13 1600)  Labs:  Recent Labs  07/29/13 1553 07/30/13 0040 07/30/13 0520 07/30/13 0740 07/30/13 1125 07/30/13 1625  HGB 17.8* 16.4  --   --   --   --   HCT 50.4 46.0  --   --   --   --   PLT 217 252  --   --   --   --   APTT  --  100*  --   --   --   --   LABPROT  --  13.8  --   --   --   --   INR  --  1.08  --   --   --   --   HEPARINUNFRC  --  0.26*  --  0.34  --  0.55  CREATININE 0.90 0.97  --   --   --   --   TROPONINI 1.09*  --  5.60*  --  3.89*  --     Estimated Creatinine Clearance: 104.4 ml/min (by C-G formula based on Cr of 0.97).   Medications:  Heparin @ 1750 units/hr  Assessment: 57yom continues on heparin for chest pain with plans for cath Monday. Confirmatory heparin level is therapeutic.   Goal of Therapy:  Heparin level 0.3-0.7 units/ml Monitor platelets by anticoagulation protocol: Yes   Plan:  1) Continue heparin at 1750 units/hr 2) Follow up heparin level and CBC in AM  Fredrik Rigger 07/30/2013,5:50 PM

## 2013-07-30 NOTE — Progress Notes (Signed)
ANTICOAGULATION CONSULT NOTE - Follow Up Consult  Pharmacy Consult:  Heparin Indication: chest pain/ACS  No Known Allergies  Patient Measurements: Height: 6\' 2"  (188 cm) Weight: 223 lb 15.8 oz (101.6 kg) IBW/kg (Calculated) : 82.2 Heparin Dosing Weight: 102 kg  Vital Signs: Temp: 97.9 F (36.6 C) (12/13 0705) Temp src: Oral (12/13 0705) BP: 102/78 mmHg (12/13 0705) Pulse Rate: 83 (12/13 0705)  Labs:  Recent Labs  07/29/13 1553 07/30/13 0040 07/30/13 0520 07/30/13 0740  HGB 17.8* 16.4  --   --   HCT 50.4 46.0  --   --   PLT 217 252  --   --   APTT  --  100*  --   --   LABPROT  --  13.8  --   --   INR  --  1.08  --   --   HEPARINUNFRC  --  0.26*  --  0.34  CREATININE 0.90 0.97  --   --   TROPONINI 1.09*  --  5.60*  --     Estimated Creatinine Clearance: 104.4 ml/min (by C-G formula based on Cr of 0.97).      Assessment: Alex Yang with no PMH continues on IV heparin for ACS.  Heparin level therapeutic and is toward the low end of goal.  No bleeding reported.   Goal of Therapy:  Heparin level 0.3-0.7 units/ml Monitor platelets by anticoagulation protocol: Yes    Plan:  - Increase heparin gtt to 1750 units/hr - Check 6 hr HL - Daily HL / CBC - F/U ECHO, A1c    Arlander Gillen D. Laney Potash, PharmD, BCPS Pager:  [phone] 07/30/2013, 10:20 AM

## 2013-07-31 DIAGNOSIS — R9439 Abnormal result of other cardiovascular function study: Secondary | ICD-10-CM

## 2013-07-31 DIAGNOSIS — I517 Cardiomegaly: Secondary | ICD-10-CM

## 2013-07-31 DIAGNOSIS — E119 Type 2 diabetes mellitus without complications: Secondary | ICD-10-CM

## 2013-07-31 LAB — GLUCOSE, CAPILLARY
Glucose-Capillary: 258 mg/dL — ABNORMAL HIGH (ref 70–99)
Glucose-Capillary: 281 mg/dL — ABNORMAL HIGH (ref 70–99)
Glucose-Capillary: 218 mg/dL — ABNORMAL HIGH (ref 70–99)
Glucose-Capillary: 218 mg/dL — ABNORMAL HIGH (ref 70–99)

## 2013-07-31 LAB — HEPARIN LEVEL (UNFRACTIONATED): Heparin Unfractionated: 0.68 IU/mL (ref 0.30–0.70)

## 2013-07-31 MED ORDER — SODIUM CHLORIDE 0.9 % IV SOLN: 250.0000 mL | INTRAVENOUS | Status: DC | PRN

## 2013-07-31 MED ORDER — INSULIN GLARGINE 100 UNIT/ML ~~LOC~~ SOLN
6.0000 [IU] | Freq: Every day | SUBCUTANEOUS | Status: DC
  Administered 2013-07-31: 6 [IU] via SUBCUTANEOUS
  Filled 2013-07-31 (×2): qty 0.06

## 2013-07-31 MED ORDER — SODIUM CHLORIDE 0.9 % IJ SOLN
3.0000 mL | Freq: Two times a day (BID) | INTRAMUSCULAR | Status: DC
  Administered 2013-07-31: 3 mL via INTRAVENOUS

## 2013-07-31 MED ORDER — SODIUM CHLORIDE 0.9 % IJ SOLN: 3.0000 mL | INTRAMUSCULAR | Status: DC | PRN

## 2013-07-31 MED ORDER — SODIUM CHLORIDE 0.9 % IV SOLN
1.0000 mL/kg/h | INTRAVENOUS | Status: DC
  Administered 2013-08-01: 1 mL/kg/h via INTRAVENOUS

## 2013-07-31 MED ORDER — ASPIRIN 81 MG PO CHEW
81.0000 mg | CHEWABLE_TABLET | ORAL | Status: AC
  Administered 2013-08-01: 81 mg via ORAL
  Filled 2013-07-31: qty 1

## 2013-07-31 NOTE — Progress Notes (Signed)
SUBJECTIVE: The patient is doing well today.  Chest pain is resolved with ntg He denies SOB presently.   Marland Kitchen aspirin EC  81 mg Oral Daily  . atorvastatin  40 mg Oral q1800  . insulin aspart  0-15 Units Subcutaneous TID WC  . metoprolol tartrate  25 mg Oral BID   . sodium chloride 10 mL/hr at 07/30/13 1948  . heparin 1,750 Units/hr (07/30/13 2125)  . nitroGLYCERIN 20 mcg/min (07/31/13 0016)    OBJECTIVE: Physical Exam: Filed Vitals:   07/31/13 0300 07/31/13 0400 07/31/13 0600 07/31/13 0714  BP:  124/73 126/64 116/76  Pulse: 83     Temp: 98.2 F (36.8 C)   98.8 F (37.1 C)  TempSrc: Oral   Oral  Resp: 28 15 27 11   Height:      Weight:      SpO2: 95%   98%    Intake/Output Summary (Last 24 hours) at 07/31/13 0824 Last data filed at 07/31/13 0800  Gross per 24 hour  Intake    933 ml  Output   1250 ml  Net   -317 ml    Telemetry reveals sinus rhythm  GEN- The patient is well appearing, alert and oriented x 3 today.   Head- normocephalic, atraumatic Eyes-  Sclera clear, conjunctiva pink Ears- hearing intact Oropharynx- clear Neck- supple, no JVP Lymph- no cervical lymphadenopathy Lungs- Clear to ausculation bilaterally, normal work of breathing Heart- Regular rate and rhythm, no murmurs, rubs or gallops, PMI not laterally displaced GI- soft, NT, ND, + BS Extremities- no clubbing, cyanosis, or edema Skin- no rash or lesion Psych- euthymic mood, full affect Neuro- strength and sensation are intact  ekg today reveals sinus rhythm, ST depression is less prominent  LABS: Basic Metabolic Panel:  Recent Labs  53/66/44 1553 07/30/13 0040  NA 132* 136  K 4.0 3.9  CL 94* 98  CO2 24 30  GLUCOSE 333* 243*  BUN 19 21  CREATININE 0.90 0.97  CALCIUM 9.7 8.7   Liver Function Tests:  Recent Labs  07/29/13 1553  AST 41*  ALT 56*  ALKPHOS 90  BILITOT 0.5  PROT 7.7  ALBUMIN 4.0   No results found for this basename: LIPASE, AMYLASE,  in the last 72  hours CBC:  Recent Labs  07/29/13 1553 07/30/13 0040  WBC 10.7* 9.6  NEUTROABS 8.0*  --   HGB 17.8* 16.4  HCT 50.4 46.0  MCV 89.4 90.4  PLT 217 252   Cardiac Enzymes:  Recent Labs  07/29/13 1553 07/30/13 0520 07/30/13 1125  TROPONINI 1.09* 5.60* 3.89*   BNP: No components found with this basename: POCBNP,  D-Dimer:  Recent Labs  07/29/13 1722  DDIMER 0.28   Hemoglobin A1C:  Recent Labs  07/30/13 0040  HGBA1C 12.3*   Fasting Lipid Panel:  Recent Labs  07/30/13 0040  CHOL 162  HDL 46  LDLCALC 92  TRIG 122  CHOLHDL 3.5   Thyroid Function Tests:  Recent Labs  07/30/13 0040  TSH 2.457   Anemia Panel: No results found for this basename: VITAMINB12, FOLATE, FERRITIN, TIBC, IRON, RETICCTPCT,  in the last 72 hours  RADIOLOGY: Dg Chest 2 View  07/29/2013   CLINICAL DATA:  Chest pain  EXAM: CHEST  2 VIEW  COMPARISON:  None.  FINDINGS: The heart size and mediastinal contours are within normal limits. Both lungs are clear. The patient is status post prior surgery of left shoulder. Degenerative joint changes of the spine are noted.  IMPRESSION: No active cardiopulmonary disease.   Electronically Signed   By: Sherian Rein M.D.   On: 07/29/2013 16:05    ASSESSMENT AND PLAN:  Active Problems:   NSTEMI (non-ST elevated myocardial infarction) 1. NSTEMI Stable on heparin/ nitro Transfer to telemetry Will need cath on Monday- risks, benefits, and alternatives to the procedure were discussed at length with the patient who wishes to proceed.  2. HTN Stable May want to add ace inhibitor post cath given EF 40% and LVH  3. DM Elevated blood sugars Will add lantus Diabetes teaching  Hillis Range, MD 07/31/2013 8:24 AM

## 2013-07-31 NOTE — Progress Notes (Signed)
ANTICOAGULATION CONSULT NOTE - Follow Up Consult  Pharmacy Consult for Heparin Indication: chest pain/ACS  No Known Allergies  Patient Measurements: Height: 6\' 2"  (188 cm) Weight: 223 lb 15.8 oz (101.6 kg) IBW/kg (Calculated) : 82.2 Heparin Dosing Weight: 102kg  Vital Signs: Temp: 98.2 F (36.8 C) (12/14 1028) Temp src: Oral (12/14 1028) BP: 115/69 mmHg (12/14 1028) Pulse Rate: 83 (12/14 1028)  Labs:  Recent Labs  07/29/13 1553  07/30/13 0040 07/30/13 0520 07/30/13 0740 07/30/13 1125 07/30/13 1625 07/31/13 0640  HGB 17.8*  --  16.4  --   --   --   --   --   HCT 50.4  --  46.0  --   --   --   --   --   PLT 217  --  252  --   --   --   --   --   APTT  --   --  100*  --   --   --   --   --   LABPROT  --   --  13.8  --   --   --   --   --   INR  --   --  1.08  --   --   --   --   --   HEPARINUNFRC  --   < > 0.26*  --  0.34  --  0.55 0.68  CREATININE 0.90  --  0.97  --   --   --   --   --   TROPONINI 1.09*  --   --  5.60*  --  3.89*  --   --   < > = values in this interval not displayed.  Estimated Creatinine Clearance: 104.4 ml/min (by C-G formula based on Cr of 0.97).   Medications:  Heparin @ 1750 units/hr  Assessment: 57yom continues on heparin for chest pain with plans for cath Monday. AM HL has trended up to 0.68 (upper end of range).   Goal of Therapy:  Heparin level 0.3-0.7 units/ml Monitor platelets by anticoagulation protocol: Yes   Plan:  1) Decrease heparin to 1700 units/hr 2) Follow up heparin level and CBC in AM  Vinnie Level, PharmD.  Clinical Pharmacist Pager [phone]

## 2013-08-01 ENCOUNTER — Encounter (HOSPITAL_COMMUNITY): Payer: Self-pay | Admitting: Physician Assistant

## 2013-08-01 ENCOUNTER — Other Ambulatory Visit: Payer: Self-pay

## 2013-08-01 ENCOUNTER — Encounter (HOSPITAL_COMMUNITY)
Admission: EM | Disposition: A | Discharge status: Home / Self Care | Payer: Self-pay | Source: Home / Self Care | Attending: Cardiothoracic Surgery

## 2013-08-01 DIAGNOSIS — Z01810 Encounter for preprocedural cardiovascular examination: Secondary | ICD-10-CM

## 2013-08-01 DIAGNOSIS — I2510 Atherosclerotic heart disease of native coronary artery without angina pectoris: Secondary | ICD-10-CM

## 2013-08-01 HISTORY — PX: LEFT HEART CATHETERIZATION WITH CORONARY ANGIOGRAM: SHX5451

## 2013-08-01 LAB — URINALYSIS, ROUTINE W REFLEX MICROSCOPIC
Bilirubin Urine: NEGATIVE
Glucose, UA: >1000 mg/dL — AB
Hgb urine dipstick: NEGATIVE
Ketones, ur: NEGATIVE mg/dL
Leukocytes, UA: NEGATIVE
Nitrite: NEGATIVE
Protein, ur: NEGATIVE mg/dL
Specific Gravity, Urine: 1.027 (ref 1.005–1.030)
Urobilinogen, UA: 1 mg/dL (ref 0.0–1.0)
pH: 7.5 (ref 5.0–8.0)

## 2013-08-01 LAB — BASIC METABOLIC PANEL
BUN: 14 mg/dL (ref 6–23)
CO2: 26 mEq/L (ref 19–32)
Calcium: 8.7 mg/dL (ref 8.4–10.5)
Chloride: 102 mEq/L (ref 96–112)
Creatinine, Ser: 0.92 mg/dL (ref 0.50–1.35)
GFR calc Af Amer: >90 mL/min (ref >90)
GFR calc non Af Amer: >90 mL/min (ref >90)
Glucose, Bld: 252 mg/dL — ABNORMAL HIGH (ref 70–99)
Potassium: 4.1 mEq/L (ref 3.5–5.1)
Sodium: 138 mEq/L (ref 135–145)

## 2013-08-01 LAB — BLOOD GAS, ARTERIAL
Acid-base deficit: 0 mmol/L (ref 0.0–2.0)
Bicarbonate: 23.6 mEq/L (ref 20.0–24.0)
Drawn by: 39866
FIO2: 0.21 %
O2 Saturation: 97.8 %
Patient temperature: 98.1
TCO2: 24.7 mmol/L (ref 0–100)
pCO2 arterial: 34.9 mmHg — ABNORMAL LOW (ref 35.0–45.0)
pH, Arterial: 7.444 (ref 7.350–7.450)
pO2, Arterial: 90.4 mmHg (ref 80.0–100.0)

## 2013-08-01 LAB — TYPE AND SCREEN
ABO/RH(D): A POS
Antibody Screen: NEGATIVE

## 2013-08-01 LAB — CBC
HCT: 42.6 % (ref 39.0–52.0)
Hemoglobin: 15.1 g/dL (ref 13.0–17.0)
MCH: 32 pg (ref 26.0–34.0)
MCHC: 35.4 g/dL (ref 30.0–36.0)
MCV: 90.3 fL (ref 78.0–100.0)
Platelets: 231 10*3/uL (ref 150–400)
RBC: 4.72 MIL/uL (ref 4.22–5.81)
RDW: 12.2 % (ref 11.5–15.5)
WBC: 10.2 10*3/uL (ref 4.0–10.5)

## 2013-08-01 LAB — GLUCOSE, CAPILLARY
Glucose-Capillary: 251 mg/dL — ABNORMAL HIGH (ref 70–99)
Glucose-Capillary: 233 mg/dL — ABNORMAL HIGH (ref 70–99)
Glucose-Capillary: 205 mg/dL — ABNORMAL HIGH (ref 70–99)
Glucose-Capillary: 179 mg/dL — ABNORMAL HIGH (ref 70–99)
Glucose-Capillary: 244 mg/dL — ABNORMAL HIGH (ref 70–99)

## 2013-08-01 LAB — ABO/RH: ABO/RH(D): A POS

## 2013-08-01 LAB — HEPARIN LEVEL (UNFRACTIONATED): Heparin Unfractionated: 0.56 IU/mL (ref 0.30–0.70)

## 2013-08-01 LAB — URINE MICROSCOPIC-ADD ON

## 2013-08-01 SURGERY — LEFT HEART CATHETERIZATION WITH CORONARY ANGIOGRAM
Anesthesia: LOCAL

## 2013-08-01 MED ORDER — SIMVASTATIN 40 MG PO TABS
40.0000 mg | ORAL_TABLET | Freq: Every day | ORAL | Status: DC
  Administered 2013-08-01 – 2013-08-04 (×3): 40 mg via ORAL
  Filled 2013-08-01 (×6): qty 1

## 2013-08-01 MED ORDER — SODIUM CHLORIDE 0.9 % IV SOLN: 1.0000 mL/kg/h | INTRAVENOUS | Status: DC

## 2013-08-01 MED ORDER — HEPARIN (PORCINE) IN NACL 2-0.9 UNIT/ML-% IJ SOLN
INTRAMUSCULAR | Status: AC
  Filled 2013-08-01: qty 1000

## 2013-08-01 MED ORDER — VERAPAMIL HCL 2.5 MG/ML IV SOLN
INTRAVENOUS | Status: AC
  Filled 2013-08-01: qty 2

## 2013-08-01 MED ORDER — SODIUM CHLORIDE 0.9 % IV SOLN: 250.0000 mL | INTRAVENOUS | Status: DC | PRN

## 2013-08-01 MED ORDER — SODIUM CHLORIDE 0.9 % IV SOLN
INTRAVENOUS | Status: DC
  Filled 2013-08-01: qty 1

## 2013-08-01 MED ORDER — EPINEPHRINE HCL 1 MG/ML IJ SOLN
0.5000 ug/min | INTRAVENOUS | Status: DC
  Filled 2013-08-01: qty 4

## 2013-08-01 MED ORDER — MIDAZOLAM HCL 2 MG/2ML IJ SOLN
INTRAMUSCULAR | Status: AC
  Filled 2013-08-01: qty 2

## 2013-08-01 MED ORDER — DEXTROSE 5 % IV SOLN
750.0000 mg | INTRAVENOUS | Status: DC
  Filled 2013-08-01 (×2): qty 750

## 2013-08-01 MED ORDER — CHLORHEXIDINE GLUCONATE 4 % EX LIQD
CUTANEOUS | Status: AC
  Administered 2013-08-01: 23:00:00
  Filled 2013-08-01: qty 30

## 2013-08-01 MED ORDER — HEPARIN SODIUM (PORCINE) 1000 UNIT/ML IJ SOLN
INTRAMUSCULAR | Status: AC
  Filled 2013-08-01: qty 1

## 2013-08-01 MED ORDER — VANCOMYCIN HCL 10 G IV SOLR
1500.0000 mg | INTRAVENOUS | Status: AC
  Administered 2013-08-02: 1500 mg via INTRAVENOUS
  Filled 2013-08-01: qty 1500

## 2013-08-01 MED ORDER — SODIUM CHLORIDE 0.9 % IV SOLN
INTRAVENOUS | Status: DC
  Filled 2013-08-01: qty 30

## 2013-08-01 MED ORDER — DOPAMINE-DEXTROSE 3.2-5 MG/ML-% IV SOLN
2.0000 ug/kg/min | INTRAVENOUS | Status: DC
  Filled 2013-08-01: qty 250

## 2013-08-01 MED ORDER — NITROGLYCERIN 0.2 MG/ML ON CALL CATH LAB
INTRAVENOUS | Status: AC
  Filled 2013-08-01: qty 1

## 2013-08-01 MED ORDER — ALPRAZOLAM 0.25 MG PO TABS
0.2500 mg | ORAL_TABLET | ORAL | Status: DC | PRN
  Administered 2013-08-01: 0.25 mg via ORAL
  Filled 2013-08-01: qty 1

## 2013-08-01 MED ORDER — NITROGLYCERIN IN D5W 200-5 MCG/ML-% IV SOLN
2.0000 ug/min | INTRAVENOUS | Status: DC
  Filled 2013-08-01 (×2): qty 250

## 2013-08-01 MED ORDER — DEXMEDETOMIDINE HCL IN NACL 400 MCG/100ML IV SOLN
0.1000 ug/kg/h | INTRAVENOUS | Status: DC
  Filled 2013-08-01: qty 100

## 2013-08-01 MED ORDER — DEXTROSE 5 % IV SOLN
1.5000 g | INTRAVENOUS | Status: AC
  Administered 2013-08-02: 1.5 g via INTRAVENOUS
  Administered 2013-08-02: .75 g via INTRAVENOUS
  Filled 2013-08-01: qty 1.5

## 2013-08-01 MED ORDER — TEMAZEPAM 15 MG PO CAPS: 15.0000 mg | ORAL_CAPSULE | Freq: Once | ORAL | Status: AC | PRN

## 2013-08-01 MED ORDER — BD GETTING STARTED TAKE HOME KIT: 1/2ML X 30G SYRINGES
1.0000 | Freq: Once | Status: DC
  Filled 2013-08-01: qty 1

## 2013-08-01 MED ORDER — MAGNESIUM SULFATE 50 % IJ SOLN
40.0000 meq | INTRAMUSCULAR | Status: DC
  Filled 2013-08-01: qty 10

## 2013-08-01 MED ORDER — LIVING WELL WITH DIABETES BOOK
Freq: Once | Status: AC
  Administered 2013-08-01: 12:00:00
  Filled 2013-08-01: qty 1

## 2013-08-01 MED ORDER — POTASSIUM CHLORIDE 2 MEQ/ML IV SOLN
80.0000 meq | INTRAVENOUS | Status: DC
  Filled 2013-08-01: qty 40

## 2013-08-01 MED ORDER — FENTANYL CITRATE 0.05 MG/ML IJ SOLN
INTRAMUSCULAR | Status: AC
  Filled 2013-08-01: qty 2

## 2013-08-01 MED ORDER — BD GETTING STARTED TAKE HOME KIT: 1ML X 30 G SYRINGES,
1.0000 | Freq: Once | Status: AC
Start: 2013-08-01 — End: 2013-08-01
  Administered 2013-08-01: 1
  Filled 2013-08-01: qty 1

## 2013-08-01 MED ORDER — PLASMA-LYTE 148 IV SOLN
INTRAVENOUS | Status: AC
  Administered 2013-08-02: 09:00:00
  Filled 2013-08-01: qty 2.5

## 2013-08-01 MED ORDER — SODIUM CHLORIDE 0.9 % IV SOLN
INTRAVENOUS | Status: DC
  Filled 2013-08-01: qty 40

## 2013-08-01 MED ORDER — HEPARIN (PORCINE) IN NACL 100-0.45 UNIT/ML-% IJ SOLN
1750.0000 [IU]/h | INTRAMUSCULAR | Status: DC
  Administered 2013-08-01: 1750 [IU]/h via INTRAVENOUS
  Filled 2013-08-01 (×2): qty 250

## 2013-08-01 MED ORDER — SODIUM CHLORIDE 0.9 % IJ SOLN: 3.0000 mL | INTRAMUSCULAR | Status: DC | PRN

## 2013-08-01 MED ORDER — LIDOCAINE HCL (PF) 1 % IJ SOLN
INTRAMUSCULAR | Status: AC
  Filled 2013-08-01: qty 30

## 2013-08-01 MED ORDER — METOPROLOL TARTRATE 12.5 MG HALF TABLET
12.5000 mg | ORAL_TABLET | Freq: Once | ORAL | Status: AC
  Administered 2013-08-02: 12.5 mg via ORAL
  Filled 2013-08-01: qty 1

## 2013-08-01 MED ORDER — PHENYLEPHRINE HCL 10 MG/ML IJ SOLN
30.0000 ug/min | INTRAVENOUS | Status: DC
  Filled 2013-08-01: qty 2

## 2013-08-01 MED ORDER — BISACODYL 5 MG PO TBEC
5.0000 mg | DELAYED_RELEASE_TABLET | Freq: Once | ORAL | Status: DC
  Filled 2013-08-01: qty 1

## 2013-08-01 MED ORDER — SODIUM CHLORIDE 0.9 % IJ SOLN: 3.0000 mL | Freq: Two times a day (BID) | INTRAMUSCULAR | Status: DC

## 2013-08-01 NOTE — Progress Notes (Signed)
TR band removed per protocol. 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 No immediate complications. Gauze and tegaderm applied. VSS. Pt without complaints. Nightshift RN made aware.

## 2013-08-01 NOTE — H&P (View-Only) (Signed)
 SUBJECTIVE: The patient is doing well today.  Chest pain is resolved with ntg He denies SOB presently.   . aspirin EC  81 mg Oral Daily  . atorvastatin  40 mg Oral q1800  . insulin aspart  0-15 Units Subcutaneous TID WC  . metoprolol tartrate  25 mg Oral BID   . sodium chloride 10 mL/hr at 07/30/13 1948  . heparin 1,750 Units/hr (07/30/13 2125)  . nitroGLYCERIN 20 mcg/min (07/31/13 0016)    OBJECTIVE: Physical Exam: Filed Vitals:   07/31/13 0300 07/31/13 0400 07/31/13 0600 07/31/13 0714  BP:  124/73 126/64 116/76  Pulse: 83     Temp: 98.2 F (36.8 C)   98.8 F (37.1 C)  TempSrc: Oral   Oral  Resp: 28 15 27 11  Height:      Weight:      SpO2: 95%   98%    Intake/Output Summary (Last 24 hours) at 07/31/13 0824 Last data filed at 07/31/13 0800  Gross per 24 hour  Intake    933 ml  Output   1250 ml  Net   -317 ml    Telemetry reveals sinus rhythm  GEN- The patient is well appearing, alert and oriented x 3 today.   Head- normocephalic, atraumatic Eyes-  Sclera clear, conjunctiva pink Ears- hearing intact Oropharynx- clear Neck- supple, no JVP Lymph- no cervical lymphadenopathy Lungs- Clear to ausculation bilaterally, normal work of breathing Heart- Regular rate and rhythm, no murmurs, rubs or gallops, PMI not laterally displaced GI- soft, NT, ND, + BS Extremities- no clubbing, cyanosis, or edema Skin- no rash or lesion Psych- euthymic mood, full affect Neuro- strength and sensation are intact  ekg today reveals sinus rhythm, ST depression is less prominent  LABS: Basic Metabolic Panel:  Recent Labs  07/29/13 1553 07/30/13 0040  NA 132* 136  K 4.0 3.9  CL 94* 98  CO2 24 30  GLUCOSE 333* 243*  BUN 19 21  CREATININE 0.90 0.97  CALCIUM 9.7 8.7   Liver Function Tests:  Recent Labs  07/29/13 1553  AST 41*  ALT 56*  ALKPHOS 90  BILITOT 0.5  PROT 7.7  ALBUMIN 4.0   No results found for this basename: LIPASE, AMYLASE,  in the last 72  hours CBC:  Recent Labs  07/29/13 1553 07/30/13 0040  WBC 10.7* 9.6  NEUTROABS 8.0*  --   HGB 17.8* 16.4  HCT 50.4 46.0  MCV 89.4 90.4  PLT 217 252   Cardiac Enzymes:  Recent Labs  07/29/13 1553 07/30/13 0520 07/30/13 1125  TROPONINI 1.09* 5.60* 3.89*   BNP: No components found with this basename: POCBNP,  D-Dimer:  Recent Labs  07/29/13 1722  DDIMER 0.28   Hemoglobin A1C:  Recent Labs  07/30/13 0040  HGBA1C 12.3*   Fasting Lipid Panel:  Recent Labs  07/30/13 0040  CHOL 162  HDL 46  LDLCALC 92  TRIG 122  CHOLHDL 3.5   Thyroid Function Tests:  Recent Labs  07/30/13 0040  TSH 2.457   Anemia Panel: No results found for this basename: VITAMINB12, FOLATE, FERRITIN, TIBC, IRON, RETICCTPCT,  in the last 72 hours  RADIOLOGY: Dg Chest 2 View  07/29/2013   CLINICAL DATA:  Chest pain  EXAM: CHEST  2 VIEW  COMPARISON:  None.  FINDINGS: The heart size and mediastinal contours are within normal limits. Both lungs are clear. The patient is status post prior surgery of left shoulder. Degenerative joint changes of the spine are noted.    IMPRESSION: No active cardiopulmonary disease.   Electronically Signed   By: Wei-Chen  Lin M.D.   On: 07/29/2013 16:05    ASSESSMENT AND PLAN:  Active Problems:   NSTEMI (non-ST elevated myocardial infarction) 1. NSTEMI Stable on heparin/ nitro Transfer to telemetry Will need cath on Monday- risks, benefits, and alternatives to the procedure were discussed at length with the patient who wishes to proceed.  2. HTN Stable May want to add ace inhibitor post cath given EF 40% and LVH  3. DM Elevated blood sugars Will add lantus Diabetes teaching  Kashmir Lysaght, MD 07/31/2013 8:24 AM  

## 2013-08-01 NOTE — Progress Notes (Signed)
VASCULAR LAB PRELIMINARY  PRELIMINARY  PRELIMINARY  PRELIMINARY  Pre-op Cardiac Surgery  Carotid Findings:  Right:  40-59% internal carotid artery stenosis.  Left:  1-39% ICA stenosis.  Bilateral:  Vertebral artery flow is antegrade.       Upper Extremity Right Left  Brachial Pressures monophasic 135 triphasic  Radial Waveforms  triphasic  Ulnar Waveforms  triphasic  Palmar Arch (Allen's Test)  WNL   Findings:  Right:  Allen's Test not performed because cardiac cath via right radial artery.  Left:  Doppler waveforms remain normal with ulnar and radial compressions.    Lower  Extremity Right Left  Dorsalis Pedis    Anterior Tibial 171 triphasic 157 triphasic  Posterior Tibial 171 triphasic 177 triphasic  Ankle/Brachial Indices >1.0 >1.0    Findings:  ABI is within normal limits bilaterally.   Loman Logan, RVT 08/01/2013, 5:46 PM

## 2013-08-01 NOTE — Progress Notes (Signed)
1430-1450 Pt getting ready to go to cath lab. Gave MI booklet and began to discuss risk factors. Discussed chest pain and was starting to explain NTG use when cath lab arrived. Will continue ed tomorrow. Luetta Nutting RN BSN 08/01/2013 2:52 PM

## 2013-08-01 NOTE — CV Procedure (Signed)
    Cardiac Catheterization Procedure Note  Name: RAEQUON CATANZARO MRN: [id number] DOB: 1956/07/08  Procedure: Left Heart Cath, Selective Coronary Angiography, LV angiography  Indication: NSTEMI. 57 year-old male admitted with NSTEMI. Has newly diagnosed, uncontrolled diabetes and HTN.   Procedural Details: The right wrist was prepped, draped, and anesthetized with 1% lidocaine. Using the modified Seldinger technique, a 5 French sheath was introduced into the right radial artery. 3 mg of verapamil was administered through the sheath, weight-based unfractionated heparin was administered intravenously. Standard Judkins catheters were used for selective coronary angiography and left ventriculography. Catheter exchanges were performed over an exchange length guidewire. There were no immediate procedural complications. A TR band was used for radial hemostasis at the completion of the procedure.  The patient was transferred to the post catheterization recovery area for further monitoring.  Procedural Findings: Hemodynamics: AO 128/82 LV 123/31  Coronary angiography: Coronary dominance: right  Left mainstem: Patent with no obstructive disease  Left anterior descending (LAD): Diffusely diseased vessel with tandem 75% lesions in the proximal and mid-vessel. The entire vessel has diffuse irregularity and nonobstructive disease into the apical portion.  Left circumflex (LCx): there is a severely diseased intermediate branch with 95% stenosis. The AV circumflex has a 90% hypodense lesion in the proximal portion of the vessel. There are 2 OM branches with diffuse nonobstructive disease.  Right coronary artery (RCA): Total occlusion proximally. Fills from right to right and left to right collaterals.   Left ventriculography: The basal and mid-inferior walls are akinetic. The LVEF is estimated at 40%.  Final Conclusions:   1. 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 Moderate segmental LV dysfunction with evidence of prior inferior infarction  Recommendations: Cardiac surgery consultation for CABG.  Tonny Bollman 08/01/2013, 4:12 PM

## 2013-08-01 NOTE — Consult Note (Signed)
[address]       [place] [address]             [phone]        EASHAN SCHIPANI [facility] Medical Record #[id number] Date of Birth: 1956/08/10  Referring: No ref. provider found Primary Care: No PCP Per Patient  Chief Complaint:    Chief Complaint  Patient presents with  . Chest Pain    History of Present Illness:     CHRISTOP HIPPERT is a 57 y.o. male who presents to the Emergency Department 07/29/2013 complaining of chest tightness that radiates to the axillas bilateral . He notes it has  has been intermittent for the past 2 weeks. He came to ER when the pain came on suddenly under the bilateral axilla areas while watching TV and lasted for 5 minutes, the longest episode yet. He reports associated "clamminess" during that episode. He notes  that  lifting groceries will trigger the pain. He denies SOB, emesis, abdominal pain, back pain or nausea. He denies having a h/o cardiac or pulmonary problems, HTN, HLD and DM. He is none smoker. He was found to have an elevated troponin and was admitted with a NSTEMI 3 days ago   Current Activity/ Functional Status: Patient is independent with mobility/ambulation, transfers, ADL's, IADL's.   Zubrod Score: At the time of surgery this patient's most appropriate activity status/level should be described as: []  Normal activity, no symptoms [x]  Symptoms, fully ambulatory []  Symptoms, in bed less than or equal to 50% of the time []  Symptoms, in bed greater than 50% of the time but less than 100% []  Bedridden []  Moribund  Past Medical History  Diagnosis Date  . NSTEMI (non-ST elevated myocardial infarction)   . Diabetes     Past Surgical History  Procedure Laterality Date  . Fracture surgery      left arm  . Nasal fracture surgery      History  Smoking status  . Never Smoker   Smokeless tobacco  . Not on file    History  Alcohol Use No    History   Social History  . Marital Status: Single   Spouse Name: N/A    Number of Children: 50 yo son and two daughters  . Years of Education: N/A   Occupational History  . Process Server    Social History Main Topics  . Smoking status: Never Smoker   . Smokeless tobacco: Not on file  . Alcohol Use: No  . Drug Use: No  .     Other Topics Concern  . Not on file   Social History Narrative   Lives with S.O.    No Known Allergies  Current Facility-Administered Medications  Medication Dose Route Frequency Provider Last Rate Last Dose  . 0.9 %  sodium chloride infusion   Intravenous Continuous PRN Vesta Mixer, MD 10 mL/hr at 07/30/13 1948    . acetaminophen (TYLENOL) tablet 650 mg  650 mg Oral Q4H PRN Quintella Reichert, MD   650 mg at 08/01/13 0224  . aspirin EC tablet 81 mg  81 mg Oral Daily Quintella Reichert, MD   81 mg at 08/01/13 1000  . bd getting started take home kit 1 ml X 30g syringes 1 kit  1 kit Other Once [employer], RPH      . diphenhydrAMINE (BENADRYL) capsule 25 mg  25 mg Oral Q6H PRN Vesta Mixer, MD  25 mg at 07/30/13 2219  . heparin ADULT infusion 100 units/mL (25000 units/250 mL)  1,750 Units/hr Intravenous Continuous Vesta Mixer, MD      . insulin aspart (novoLOG) injection 0-15 Units  0-15 Units Subcutaneous TID WC Hillis Range, MD   5 Units at 08/01/13 1240  . metoprolol tartrate (LOPRESSOR) tablet 25 mg  25 mg Oral BID Quintella Reichert, MD   25 mg at 08/01/13 1000  . nitroGLYCERIN (NITROSTAT) SL tablet 0.4 mg  0.4 mg Sublingual Q5 min PRN Glynn Octave, MD   0.4 mg at 07/29/13 1612  . nitroGLYCERIN (NITROSTAT) SL tablet 0.4 mg  0.4 mg Sublingual Q5 Min x 3 PRN Quintella Reichert, MD   0.4 mg at 07/30/13 0203  . nitroGLYCERIN 0.2 mg/mL in dextrose 5 % infusion  5 mcg/min Intravenous Titrated Glynn Octave, MD 6 mL/hr at 07/31/13 0016 20 mcg/min at 07/31/13 0016  . ondansetron (ZOFRAN) injection 4 mg  4 mg Intravenous Q6H PRN Quintella Reichert, MD      . simvastatin (ZOCOR) tablet 40 mg  40 mg Oral  q1800 Rhonda G Barrett, PA-C        Prescriptions prior to admission  Medication Sig Dispense Refill  . aspirin 325 MG tablet Take 650 mg by mouth once as needed for mild pain, moderate pain or headache.      . L-ARGININE PO Take 1 tablet by mouth daily.      . nitroGLYCERIN (NITROSTAT) 0.4 MG SL tablet Place 0.4 mg under the tongue every 5 (five) minutes as needed for chest pain.        Family History  Problem Relation Age of Onset  . Heart attack Mother   . Heart disease Mother   . Heart disease Father      Review of Systems:     Cardiac Review of Systems: Y or N  Chest Pain [  y  ]  Resting SOB [ n  ] Exertional SOB  Cove.Etienne  ]  Orthopnea Milo.Brash  ]   Pedal Edema [ n ]    Palpitations [n  ] Syncope  [ n ]   Presyncope [ n  ]  General Review of Systems: [Y] = yes [  ]=no Constitional: recent weight change [n]; anorexia [n  ]; fatigue [ y ]; nausea [n]; night sweats [ n ]; fever [n  ]; or chills [  n]                                                               Dental: poor dentition[  ]; Last Dentist visit:   Eye : blurred vision [  ]; diplopia [   ]; vision changes [  ];  Amaurosis fugax[  ]; Resp: cough [  ];  wheezing[  ];  hemoptysis[  ]; shortness of breath[  ]; paroxysmal nocturnal dyspnea[  ]; dyspnea on exertion[  ]; or orthopnea[  ];  GI:  gallstones[  ], vomiting[  ];  dysphagia[  ]; melena[  ];  hematochezia [  ]; heartburn[  ];   Hx of  Colonoscopy[n  ]; GU: kidney stones [  ]; hematuria[  ];   dysuria [  ];  nocturia[  ];  history of  obstruction [  ]; urinary frequency [n  ]             Skin: rash, swelling[  ];, hair loss[  y];  peripheral edema[  ];  or itching[  ]; Musculosketetal: myalgias[  ];  joint swelling[  ];  joint erythema[  ];  joint pain[  ];  back pain[  ];  Heme/Lymph: bruising[  ];  bleeding[n  ];  anemia[  ];  Neuro: TIA[  n];  headaches[ n ];  stroke[ n ];  vertigo[  ];  seizures[  ];   paresthesias[ y toes  ];  difficulty walking[   ];  Psych:depression[n  ]; anxiety[ n ];  Endocrine: diabetes[ n ];  thyroid dysfunction[ n ];  Immunizations: Flu Milo.Brash  ]; Pneumococcal[n];  Other: facial trauma with nose construction  Physical Exam: BP 122/74  Pulse 94  Temp(Src) 98.1 F (36.7 C) (Oral)  Resp 18  Ht 6\' 2"  (1.88 m)  Wt 223 lb 15.8 oz (101.6 kg)  BMI 28.75 kg/m2  SpO2 97%  General appearance: alert, cooperative, appears stated age and no distress Neurologic: intact Heart: regular rate and rhythm, S1, S2 normal, no murmur, click, rub or gallop Lungs: clear to auscultation bilaterally Abdomen: soft, non-tender; bowel sounds normal; no masses,  no organomegaly Extremities: extremities normal, atraumatic, no cyanosis or edema and Homans sign is negative, no sign of DVT Wound: rt radial cath site with compression dressing in place Full pt.dp pulses, numbness in toes, no carotid bruits  Diagnostic Studies & Laboratory data:     Recent Radiology Findings:  Dg Chest 2 View  07/29/2013   CLINICAL DATA:  Chest pain  EXAM: CHEST  2 VIEW  COMPARISON:  None.  FINDINGS: The heart size and mediastinal contours are within normal limits. Both lungs are clear. The patient is status post prior surgery of left shoulder. Degenerative joint changes of the spine are noted.  IMPRESSION: No active cardiopulmonary disease.   Electronically Signed   By: Sherian Rein M.D.   On: 07/29/2013 16:05    Recent Lab Findings: Lab Results  Component Value Date   WBC 10.2 08/01/2013   HGB 15.1 08/01/2013   HCT 42.6 08/01/2013   PLT 231 08/01/2013   GLUCOSE 252* 08/01/2013   CHOL 162 07/30/2013   TRIG 122 07/30/2013   HDL 46 07/30/2013   LDLCALC 92 07/30/2013   ALT 56* 07/29/2013   AST 41* 07/29/2013   NA 138 08/01/2013   K 4.1 08/01/2013   CL 102 08/01/2013   CREATININE 0.92 08/01/2013   BUN 14 08/01/2013   CO2 26 08/01/2013   TSH 2.457 07/30/2013   INR 1.08 07/30/2013   HGBA1C 12.3* 07/30/2013   Echo: Transthoracic  Echocardiography  Patient: Hasson, Gaspard MR #: [id number] Study Date: 07/30/2013 Gender: M Age: 27 Height: 188cm Weight: 100.7kg BSA: 2.56m^2 Pt. Status: Room: 2H27C  ORDERING Armanda Magic, MD REFERRING Armanda Magic, MD ADMITTING Nahser, Philip ATTENDING Nahser, Wynona Luna, Hospital SONOGRAPHER Jimmy Reel cc:  ------------------------------------------------------------ LV EF: 40%  ------------------------------------------------------------ Indications: Hypertension - benign 401.1. MI - acute 410.91.  ------------------------------------------------------------ Study Conclusions  - Left ventricle: Mid and basal inferior wall hypokinesis The cavity size was moderately dilated. Wall thickness was increased in a pattern of severe LVH. The estimated ejection fraction was 40%. - Mitral valve: Mild regurgitation. - Left atrium: The atrium was mildly dilated. - Atrial septum: No defect or patent foramen ovale was identified. Transthoracic echocardiography. M-mode, complete 2D, spectral  Doppler, and color Doppler. Height: Height: 188cm. Height: 74in. Weight: Weight: 100.7kg. Weight: 221.5lb. Body mass index: BMI: 28.5kg/m^2. Body surface area: BSA: 2.48m^2. Blood pressure: 102/78. Patient status: Inpatient. Location: Bedside.  ------------------------------------------------------------  ------------------------------------------------------------ Left ventricle: Mid and basal inferior wall hypokinesis The cavity size was moderately dilated. Wall thickness was increased in a pattern of severe LVH. The estimated ejection fraction was 40%.  ------------------------------------------------------------ Aortic valve: Mildly thickened leaflets. Doppler: There was no stenosis. No significant regurgitation.  ------------------------------------------------------------ Mitral valve: Mildly thickened leaflets . Doppler:  Mild regurgitation.  ------------------------------------------------------------ Left atrium: The atrium was mildly dilated.  ------------------------------------------------------------ Atrial septum: No defect or patent foramen ovale was identified.  ------------------------------------------------------------ Right ventricle: The cavity size was normal. Wall thickness was normal. Systolic function was normal.  ------------------------------------------------------------ Pulmonic valve: Structurally normal valve. Cusp separation was normal. Doppler: Transvalvular velocity was within the normal range. Trivial regurgitation.  ------------------------------------------------------------ Tricuspid valve: Structurally normal valve. Leaflet separation was normal. Doppler: Transvalvular velocity was within the normal range. Mild regurgitation.  ------------------------------------------------------------ Right atrium: The atrium was normal in size.  ------------------------------------------------------------ Pericardium: The pericardium was normal in appearance.  ------------------------------------------------------------  2D measurements Normal Doppler Normal Left ventricle measurements LVID ED, 52.4 mm 43-52 Main pulmonary chord, artery PLAX Pressure ED 13 mm ------- LVID ES, 44.2 mm 23-38 Hg chord, Left ventricle PLAX Ea, lat 8.8 cm/ ------- FS, chord, 16 % >29 ann, tiss s PLAX DP LVPW, ED 18.9 mm ------ E/Ea, lat 5.9 ------- IVS/LVPW 1 <1.3 ann, tiss ratio, ED DP Ventricular septum Ea, med 4.9 cm/ ------- IVS, ED 18.9 mm ------ ann, tiss s Aorta DP Root diam, 35 mm ------ E/Ea, med 10.59 ------- ED ann, tiss Left atrium DP AP dim 40 mm ------ Mitral valve AP dim 1.76 cm/m^2 <2.2 Peak E vel 51.9 cm/ ------- index s Peak A vel 76.2 cm/ ------- s Deceleratio 222 ms 150-230 n time Peak E/A 0.7 ------- ratio Systemic veins Estimated 5 mm ------- CVP  Hg Right ventricle Sa vel, lat 9 cm/ ------- ann, tiss s DP Pulmonic valve Regurg vel, 139 cm/ ------- ED s  ------------------------------------------------------------ Prepared and Electronically Authenticated by  Charlton Haws 2014-12-13T13:05:26  CATH: Procedural Findings:  Hemodynamics:  AO 128/82  LV 123/31  Coronary angiography:  Coronary dominance: right  Left mainstem: Patent with no obstructive disease  Left anterior descending (LAD): Diffusely diseased vessel with tandem 75% lesions in the proximal and mid-vessel. The entire vessel has diffuse irregularity and nonobstructive disease into the apical portion.  Left circumflex (LCx): there is a severely diseased intermediate branch with 95% stenosis. The AV circumflex has a 90% hypodense lesion in the proximal portion of the vessel. There are 2 OM branches with diffuse nonobstructive disease.  Right coronary artery (RCA): Total occlusion proximally. Fills from right to right and left to right collaterals.  Left ventriculography: The basal and mid-inferior walls are akinetic. The LVEF is estimated at 40%.  Final Conclusions:  1. 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 Moderate segmental LV dysfunction with evidence of prior inferior infarction      Assessment / Plan:      New DX of DM poor control with HGBa1C 12.3 and evidence of peripheral neuropathy LV dysfunction EF 40% severe LVH by echo 3 vessel CAD as noted above Nonstemi 72 hours ago  Cabg has been  recommended for relief of symptoms and relief  of further  LV dysfunction. The goals risks and alternatives of the planned surgical procedure CABG  have been discussed with the patient in detail. The risks of the procedure including death, infection, stroke, myocardial infarction, bleeding, blood transfusion have all been discussed specifically.  I have quoted [name] a 3 % of perioperative mortality and a complication rate as high as 20%. The patient's questions have been answered.[name] is willing  to proceed with the planned procedure.  Plan CABG in am  I spent 60 minutes counseling the patient face to face. The total time spent in the appointment was 80 minutes.   Delight Ovens MD      [address] [place].[address] [place] [address] Office [phone]   Beeper [phone]  08/01/2013 4:22 PM

## 2013-08-01 NOTE — Progress Notes (Signed)
Patient Name: Alex Yang Date of Encounter: 08/01/2013  Active Problems:   NSTEMI (non-ST elevated myocardial infarction)   Diabetes mellitus    SUBJECTIVE: No chest pain, no SOB, no checkup in years, has lost 85 lbs over the last few years with eating changes. Woke up with cramps in arms/hands after taking statin.  OBJECTIVE Filed Vitals:   07/31/13 1028 07/31/13 1348 07/31/13 2100 08/01/13 0555  BP: 115/69 117/74 131/71 132/42  Pulse: 83 86 87 56  Temp: 98.2 F (36.8 C) 97.9 F (36.6 C) 98.4 F (36.9 C) 98.1 F (36.7 C)  TempSrc: Oral Oral Oral Oral  Resp: 20 18 18 18   Height:      Weight:      SpO2: 97% 99% 99% 95%    Intake/Output Summary (Last 24 hours) at 08/01/13 0857 Last data filed at 08/01/13 0600  Gross per 24 hour  Intake 1690.73 ml  Output      0 ml  Net 1690.73 ml   Filed Weights   07/29/13 1511 07/29/13 2226  Weight: 224 lb (101.606 kg) 223 lb 15.8 oz (101.6 kg)    PHYSICAL EXAM General: Well developed, well nourished, male in no acute distress. Head: Normocephalic, atraumatic.  Neck: Supple without bruits, JVD not elevated. Lungs:  Resp regular and unlabored, CTA. Heart: RRR, S1, S2, no S3, S4, or murmur; no rub. Abdomen: Soft, non-tender, non-distended, BS + x 4.  Extremities: No clubbing, cyanosis, no edema.  Neuro: Alert and oriented X 3. Moves all extremities spontaneously. Psych: Normal affect.  LABS: CBC:  Recent Labs  07/29/13 1553 07/30/13 0040 08/01/13 0450  WBC 10.7* 9.6 10.2  NEUTROABS 8.0*  --   --   HGB 17.8* 16.4 15.1  HCT 50.4 46.0 42.6  MCV 89.4 90.4 90.3  PLT 217 252 231   INR:  Recent Labs  07/30/13 0040  INR 1.08   Basic Metabolic Panel:  Recent Labs  16/10/96 0040 08/01/13 0450  NA 136 138  K 3.9 4.1  CL 98 102  CO2 30 26  GLUCOSE 243* 252*  BUN 21 14  CREATININE 0.97 0.92  CALCIUM 8.7 8.7   Liver Function Tests:  Recent Labs  07/29/13 1553  AST 41*  ALT 56*  ALKPHOS 90    BILITOT 0.5  PROT 7.7  ALBUMIN 4.0   Cardiac Enzymes:  Recent Labs  07/29/13 1553 07/30/13 0520 07/30/13 1125  TROPONINI 1.09* 5.60* 3.89*   D-dimer:  Recent Labs  07/29/13 1722  DDIMER 0.28   Hemoglobin A1C:  Recent Labs  07/30/13 0040  HGBA1C 12.3*   Fasting Lipid Panel:  Recent Labs  07/30/13 0040  CHOL 162  HDL 46  LDLCALC 92  TRIG 122  CHOLHDL 3.5   Thyroid Function Tests:  Recent Labs  07/30/13 0040  TSH 2.457   TELE:        ECHO: 07/30/2013 Study Conclusions - Left ventricle: Mid and basal inferior wall hypokinesis The cavity size was moderately dilated. Wall thickness was increased in a pattern of severe LVH. The estimated ejection fraction was 40%. - Mitral valve: Mild regurgitation. - Left atrium: The atrium was mildly dilated. - Atrial septum: No defect or patent foramen ovale was identified.  Radiology/Studies: Dg Chest 2 View 07/29/2013   CLINICAL DATA:  Chest pain  EXAM: CHEST  2 VIEW  COMPARISON:  None.  FINDINGS: The heart size and mediastinal contours are within normal limits. Both lungs are clear. The patient is status  post prior surgery of left shoulder. Degenerative joint changes of the spine are noted.  IMPRESSION: No active cardiopulmonary disease.   Electronically Signed   By: Sherian Rein M.D.   On: 07/29/2013 16:05     Current Medications:  . aspirin EC  81 mg Oral Daily  . atorvastatin  40 mg Oral q1800  . insulin aspart  0-15 Units Subcutaneous TID WC  . insulin glargine  6 Units Subcutaneous Q2200  . metoprolol tartrate  25 mg Oral BID  . sodium chloride  3 mL Intravenous Q12H   . sodium chloride 10 mL/hr at 07/30/13 1948  . sodium chloride 1 mL/kg/hr (08/01/13 0525)  . heparin 1,700 Units/hr (08/01/13 0526)  . nitroGLYCERIN 20 mcg/min (07/31/13 0016)    ASSESSMENT AND PLAN: Active Problems:   NSTEMI (non-ST elevated myocardial infarction) - cath today, further evaluation and treatment depending on the  results. Cardiac rehab to see.     Diabetes mellitus - DM nutrition to see. Need suggestions on oral meds for d/c.    Dyslipidemia - not tolerating Lipitor 40, will change to Zocor 40, see how he does with this.     LVD - likely ICM, on BB, add ACE after cath if Cr stable.    Signed, Theodore Demark , PA-C 8:57 AM 08/01/2013  Pt seen in cath lab. Reviewed plans for cath and possible PCI today. Labs, notes, pt history reviewed.  Tonny Bollman 08/01/2013 3:23 PM

## 2013-08-01 NOTE — Plan of Care (Signed)
Problem: Food- and Nutrition-Related Knowledge Deficit (NB-1.1) Goal: Nutrition education Formal process to instruct or train a patient/client in a skill or to impart knowledge to help patients/clients voluntarily manage or modify food choices and eating behavior to maintain or improve health.  Outcome: Completed/Met Date Met:  08/01/13  RD consulted for nutrition education regarding diabetes.     Lab Results  Component Value Date    HGBA1C 12.3* 07/30/2013    RD provided "Carbohydrate Counting for People with Diabetes" handout from the Academy of Nutrition and Dietetics. Discussed different food groups and their effects on blood sugar, emphasizing carbohydrate-containing foods. Provided list of carbohydrates and recommended serving sizes of common foods.  Discussed importance of controlled and consistent carbohydrate intake throughout the day. Provided examples of ways to balance meals/snacks and encouraged intake of high-fiber, whole grain complex carbohydrates. Teach back method used.  Expect fair to good compliance.  Body mass index is 28.75 kg/(m^2). Pt meets criteria for overweight based on current BMI.  Current diet order is NPO for cardiac catheterization. Previously on CHO-modified diet with good intake (80-100%) of meals at this time. Labs and medications reviewed. No further nutrition interventions warranted at this time. RD contact information provided. If additional nutrition issues arise, please re-consult RD.  Joaquin Courts, RD, LDN, CNSC Pager [phone] After Hours Pager [phone]

## 2013-08-01 NOTE — Care Management Note (Signed)
    Page 1 of 1   08/08/2013     9:50:34 AM   CARE MANAGEMENT NOTE 08/08/2013  Patient:  Alex Yang, Alex Yang   Account Number:  [id number]  Date Initiated:  08/01/2013  Documentation initiated by:  GRAVES-BIGELOW,BRENDA  Subjective/Objective Assessment:   Pt admitted for NSTEMI- Pt admitted as self pay.     Action/Plan:   CM will speak to pt in ref to PCP and medications.   Anticipated DC Date:  08/08/2013   Anticipated DC Plan:  HOME/SELF CARE      DC Planning Services  CM consult  [facility]  [facility]      Choice offered to / List presented to:             Status of service:  Completed, signed off Medicare Important Message given?   (If response is "NO", the following Medicare IM given date fields will be blank) Date Medicare IM given:   Date Additional Medicare IM given:    Discharge Disposition:  HOME/SELF CARE  Per UR Regulation:  Reviewed for med. necessity/level of care/duration of stay  If discussed at Long Length of Stay Meetings, dates discussed:   08/09/2013    Comments:  12/22 0947 debbie Jaquail Mclees rn,bsn spoke w pt. no pcp or ins. gave pt primary care resource list. he is agreeable to [facility]. [facility] [phone] and cell [phone]. pt being dc today. friend Warnell Bureau pulliam to be w pt. gave pt match letter for meds for 34days of meds.

## 2013-08-01 NOTE — Progress Notes (Signed)
Inpatient Diabetes Program Recommendations  AACE/ADA: New Consensus Statement on Inpatient Glycemic Control (2013)  Target Ranges:  Prepandial:   less than 140 mg/dL      Peak postprandial:   less than 180 mg/dL (1-2 hours)      Critically ill patients:  140 - 180 mg/dL    Results for GARO, HEIDELBERG (MRN [id number]) as of 08/01/2013 12:43  Ref. Range 08/01/2013 07:44 08/01/2013 11:57  Glucose-Capillary Latest Range: 70-99 mg/dL 086 (H) 578 (H)    Results for OHM, DENTLER (MRN [id number]) as of 08/01/2013 12:43  Ref. Range 07/30/2013 00:40  Hemoglobin A1C Latest Range: <5.7 % 12.3 (H)    **Patient admitted with MI.  Diagnosed with DM this admission (A1c 12.3%).  Risk factors for DM include increased age, ethnicity (patient states he is Native [employer]), family history (+DM with mother).  **Spoke with pt about new diagnosis.  Discussed A1C results with him and explained what an A1C is, basic pathophysiology of DM Type 2, basic home care, importance of checking CBGs and maintaining good CBG control to prevent long-term and short-term complications.  RNs to provide ongoing basic DM education at bedside with this patient.  Have ordered educational booklet, insulin starter kit, and DM videos.  Have also ordered RD consult for DM diet education.  **Have asked RNs to please begin insulin education with this patient in case decision made to send patient home on insulin.  Based on his A1c results and cardiac issues, it may be best to send patient home on insulin initially.  Patient does not have insurance nor a PCP.  Have asked care management to assist patient in finding a PCP to follow up with for DM issues after d/c.  Patient may be able to go to the [facility] after d/c.  **Gave patient information on purchasing an inexpensive CBG meter and strips at [employer].  Meter is $16 and a box of 50 strips is $9.  **Also gave patient information on attending free  DM classes as an outpatient up at [facility]  Encouraged patient to attend asap after d/c for additional reinforcement of DM education.   **Recommend we d/c patient home on insulin and Metformin.  Patient can purchase Reli-on brand 70/30 insulin at [facility] for $25 per vial with a physician's Rx.  Patient can also get Metformin for $4 at [facility] as well.    Based on his weight and current insulin needs, recommend the following:  Start 70/30 insulin tonight with supper- 70/30 insulin 15 units bid with meals (breakfast and supper) Consider starting Metformin 500 mg bid with meals (safely after cardiac cath period for home)   Will follow. Ambrose Finland RN, MSN, CDE Diabetes Coordinator Inpatient Diabetes Program Team Pager: [phone] (8a-10p)

## 2013-08-01 NOTE — Progress Notes (Signed)
Pharmacy Consult - Heparin  Heparin to resume 4 hours post sheath removal Plan is for CABG?  Plan: 1) Resume heparin at 1750 units / hr at 2100 pm 2) Follow up AM labs  Thank you. Okey Regal, PharmD [phone]

## 2013-08-01 NOTE — Interval H&P Note (Signed)
History and Physical Interval Note:  08/01/2013 3:22 PM  Alex Yang  has presented today for surgery, with the diagnosis of cp  The various methods of treatment have been discussed with the patient and family. After consideration of risks, benefits and other options for treatment, the patient has consented to  Procedure(s): LEFT HEART CATHETERIZATION WITH CORONARY ANGIOGRAM (N/A) as a surgical intervention .  The patient's history has been reviewed, patient examined, no change in status, stable for surgery.  I have reviewed the patient's chart and labs.  Questions were answered to the patient's satisfaction.    Cath Lab Visit (complete for each Cath Lab visit)  Clinical Evaluation Leading to the Procedure:   ACS: yes  Non-ACS:    Anginal Classification: CCS IV  Anti-ischemic medical therapy: Minimal Therapy (1 class of medications)  Non-Invasive Test Results: No non-invasive testing performed  Prior CABG: No previous CABG       Tonny Bollman

## 2013-08-02 ENCOUNTER — Encounter (HOSPITAL_COMMUNITY)
Admission: EM | Disposition: A | Discharge status: Home / Self Care | Payer: Self-pay | Source: Home / Self Care | Attending: Cardiothoracic Surgery

## 2013-08-02 ENCOUNTER — Encounter (HOSPITAL_COMMUNITY): Payer: MEDICAID | Admitting: Certified Registered Nurse Anesthetist

## 2013-08-02 ENCOUNTER — Inpatient Hospital Stay (HOSPITAL_COMMUNITY): Payer: Self-pay

## 2013-08-02 ENCOUNTER — Inpatient Hospital Stay (HOSPITAL_COMMUNITY): Payer: Self-pay | Admitting: Certified Registered Nurse Anesthetist

## 2013-08-02 DIAGNOSIS — Z951 Presence of aortocoronary bypass graft: Secondary | ICD-10-CM

## 2013-08-02 DIAGNOSIS — I2510 Atherosclerotic heart disease of native coronary artery without angina pectoris: Secondary | ICD-10-CM

## 2013-08-02 HISTORY — PX: ENDOVEIN HARVEST OF GREATER SAPHENOUS VEIN: SHX5059

## 2013-08-02 HISTORY — PX: CORONARY ARTERY BYPASS GRAFT: SHX141

## 2013-08-02 HISTORY — PX: INTRAOPERATIVE TRANSESOPHAGEAL ECHOCARDIOGRAM: SHX5062

## 2013-08-02 LAB — CBC
HCT: 43.7 % (ref 39.0–52.0)
HCT: 36.4 % — ABNORMAL LOW (ref 39.0–52.0)
HCT: 36 % — ABNORMAL LOW (ref 39.0–52.0)
Hemoglobin: 15.2 g/dL (ref 13.0–17.0)
Hemoglobin: 12.8 g/dL — ABNORMAL LOW (ref 13.0–17.0)
Hemoglobin: 12.6 g/dL — ABNORMAL LOW (ref 13.0–17.0)
MCH: 31.6 pg (ref 26.0–34.0)
MCH: 31.9 pg (ref 26.0–34.0)
MCH: 31.3 pg (ref 26.0–34.0)
MCHC: 34.8 g/dL (ref 30.0–36.0)
MCHC: 35.2 g/dL (ref 30.0–36.0)
MCHC: 35 g/dL (ref 30.0–36.0)
MCV: 90.9 fL (ref 78.0–100.0)
MCV: 90.8 fL (ref 78.0–100.0)
MCV: 89.6 fL (ref 78.0–100.0)
Platelets: 239 10*3/uL (ref 150–400)
Platelets: 138 10*3/uL — ABNORMAL LOW (ref 150–400)
Platelets: 169 10*3/uL (ref 150–400)
RBC: 4.81 MIL/uL (ref 4.22–5.81)
RBC: 4.01 MIL/uL — ABNORMAL LOW (ref 4.22–5.81)
RBC: 4.02 MIL/uL — ABNORMAL LOW (ref 4.22–5.81)
RDW: 12.3 % (ref 11.5–15.5)
RDW: 12.1 % (ref 11.5–15.5)
RDW: 12 % (ref 11.5–15.5)
WBC: 9.7 10*3/uL (ref 4.0–10.5)
WBC: 14.8 10*3/uL — ABNORMAL HIGH (ref 4.0–10.5)
WBC: 15.5 10*3/uL — ABNORMAL HIGH (ref 4.0–10.5)

## 2013-08-02 LAB — POCT I-STAT 4, (NA,K, GLUC, HGB,HCT)
Glucose, Bld: 272 mg/dL — ABNORMAL HIGH (ref 70–99)
Glucose, Bld: 274 mg/dL — ABNORMAL HIGH (ref 70–99)
Glucose, Bld: 299 mg/dL — ABNORMAL HIGH (ref 70–99)
Glucose, Bld: 274 mg/dL — ABNORMAL HIGH (ref 70–99)
Glucose, Bld: 277 mg/dL — ABNORMAL HIGH (ref 70–99)
Glucose, Bld: 225 mg/dL — ABNORMAL HIGH (ref 70–99)
HCT: 44 % (ref 39.0–52.0)
HCT: 33 % — ABNORMAL LOW (ref 39.0–52.0)
HCT: 42 % (ref 39.0–52.0)
HCT: 33 % — ABNORMAL LOW (ref 39.0–52.0)
HCT: 33 % — ABNORMAL LOW (ref 39.0–52.0)
HCT: 38 % — ABNORMAL LOW (ref 39.0–52.0)
Hemoglobin: 15 g/dL (ref 13.0–17.0)
Hemoglobin: 11.2 g/dL — ABNORMAL LOW (ref 13.0–17.0)
Hemoglobin: 14.3 g/dL (ref 13.0–17.0)
Hemoglobin: 11.2 g/dL — ABNORMAL LOW (ref 13.0–17.0)
Hemoglobin: 11.2 g/dL — ABNORMAL LOW (ref 13.0–17.0)
Hemoglobin: 12.9 g/dL — ABNORMAL LOW (ref 13.0–17.0)
Potassium: 4.2 mEq/L (ref 3.5–5.1)
Potassium: 4.5 mEq/L (ref 3.5–5.1)
Potassium: 4.6 mEq/L (ref 3.5–5.1)
Potassium: 4.1 mEq/L (ref 3.5–5.1)
Potassium: 3.8 mEq/L (ref 3.5–5.1)
Potassium: 3.6 mEq/L (ref 3.5–5.1)
Sodium: 137 mEq/L (ref 135–145)
Sodium: 134 mEq/L — ABNORMAL LOW (ref 135–145)
Sodium: 136 mEq/L (ref 135–145)
Sodium: 136 mEq/L (ref 135–145)
Sodium: 137 mEq/L (ref 135–145)
Sodium: 140 mEq/L (ref 135–145)

## 2013-08-02 LAB — POCT I-STAT 3, VENOUS BLOOD GAS (G3P V)
Acid-base deficit: 2 mmol/L (ref 0.0–2.0)
Bicarbonate: 23.8 mEq/L (ref 20.0–24.0)
O2 Saturation: 79 %
Patient temperature: 35
TCO2: 25 mmol/L (ref 0–100)
pCO2, Ven: 41.5 mmHg — ABNORMAL LOW (ref 45.0–50.0)
pH, Ven: 7.358 — ABNORMAL HIGH (ref 7.250–7.300)
pO2, Ven: 40 mmHg (ref 30.0–45.0)

## 2013-08-02 LAB — POCT I-STAT 3, ART BLOOD GAS (G3+)
Acid-base deficit: 1 mmol/L (ref 0.0–2.0)
Acid-base deficit: 2 mmol/L (ref 0.0–2.0)
Acid-base deficit: 3 mmol/L — ABNORMAL HIGH (ref 0.0–2.0)
Acid-base deficit: 1 mmol/L (ref 0.0–2.0)
Acid-base deficit: 2 mmol/L (ref 0.0–2.0)
Bicarbonate: 23.6 mEq/L (ref 20.0–24.0)
Bicarbonate: 24.8 mEq/L — ABNORMAL HIGH (ref 20.0–24.0)
Bicarbonate: 23.9 mEq/L (ref 20.0–24.0)
Bicarbonate: 23.9 mEq/L (ref 20.0–24.0)
Bicarbonate: 23.1 mEq/L (ref 20.0–24.0)
O2 Saturation: 100 %
O2 Saturation: 98 %
O2 Saturation: 91 %
O2 Saturation: 93 %
O2 Saturation: 94 %
Patient temperature: 35
Patient temperature: 36.8
Patient temperature: 35.7
Patient temperature: 37.5
Patient temperature: 37.8
TCO2: 25 mmol/L (ref 0–100)
TCO2: 26 mmol/L (ref 0–100)
TCO2: 25 mmol/L (ref 0–100)
TCO2: 25 mmol/L (ref 0–100)
TCO2: 24 mmol/L (ref 0–100)
pCO2 arterial: 35.3 mmHg (ref 35.0–45.0)
pCO2 arterial: 47.8 mmHg — ABNORMAL HIGH (ref 35.0–45.0)
pCO2 arterial: 45.2 mmHg — ABNORMAL HIGH (ref 35.0–45.0)
pCO2 arterial: 41.5 mmHg (ref 35.0–45.0)
pCO2 arterial: 40.7 mmHg (ref 35.0–45.0)
pH, Arterial: 7.425 (ref 7.350–7.450)
pH, Arterial: 7.322 — ABNORMAL LOW (ref 7.350–7.450)
pH, Arterial: 7.324 — ABNORMAL LOW (ref 7.350–7.450)
pH, Arterial: 7.37 (ref 7.350–7.450)
pH, Arterial: 7.365 (ref 7.350–7.450)
pO2, Arterial: 344 mmHg — ABNORMAL HIGH (ref 80.0–100.0)
pO2, Arterial: 110 mmHg — ABNORMAL HIGH (ref 80.0–100.0)
pO2, Arterial: 61 mmHg — ABNORMAL LOW (ref 80.0–100.0)
pO2, Arterial: 72 mmHg — ABNORMAL LOW (ref 80.0–100.0)
pO2, Arterial: 78 mmHg — ABNORMAL LOW (ref 80.0–100.0)

## 2013-08-02 LAB — BASIC METABOLIC PANEL
BUN: 11 mg/dL (ref 6–23)
CO2: 20 mEq/L (ref 19–32)
Calcium: 8.5 mg/dL (ref 8.4–10.5)
Chloride: 102 mEq/L (ref 96–112)
Creatinine, Ser: 0.85 mg/dL (ref 0.50–1.35)
GFR calc Af Amer: >90 mL/min (ref >90)
GFR calc non Af Amer: >90 mL/min (ref >90)
Glucose, Bld: 233 mg/dL — ABNORMAL HIGH (ref 70–99)
Potassium: 3.8 mEq/L (ref 3.5–5.1)
Sodium: 135 mEq/L (ref 135–145)

## 2013-08-02 LAB — HEMOGLOBIN AND HEMATOCRIT, BLOOD
HCT: 33.1 % — ABNORMAL LOW (ref 39.0–52.0)
Hemoglobin: 11.5 g/dL — ABNORMAL LOW (ref 13.0–17.0)

## 2013-08-02 LAB — POCT I-STAT, CHEM 8
BUN: 6 mg/dL (ref 6–23)
Calcium, Ion: 1.19 mmol/L (ref 1.12–1.23)
Chloride: 104 mEq/L (ref 96–112)
Creatinine, Ser: 0.8 mg/dL (ref 0.50–1.35)
Glucose, Bld: 154 mg/dL — ABNORMAL HIGH (ref 70–99)
HCT: 38 % — ABNORMAL LOW (ref 39.0–52.0)
Hemoglobin: 12.9 g/dL — ABNORMAL LOW (ref 13.0–17.0)
Potassium: 3.8 mEq/L (ref 3.5–5.1)
Sodium: 141 mEq/L (ref 135–145)
TCO2: 21 mmol/L (ref 0–100)

## 2013-08-02 LAB — PROTIME-INR
INR: 1.4 (ref 0.00–1.49)
Prothrombin Time: 16.8 seconds — ABNORMAL HIGH (ref 11.6–15.2)

## 2013-08-02 LAB — GLUCOSE, CAPILLARY
Glucose-Capillary: 215 mg/dL — ABNORMAL HIGH (ref 70–99)
Glucose-Capillary: 182 mg/dL — ABNORMAL HIGH (ref 70–99)
Glucose-Capillary: 172 mg/dL — ABNORMAL HIGH (ref 70–99)
Glucose-Capillary: 150 mg/dL — ABNORMAL HIGH (ref 70–99)
Glucose-Capillary: 129 mg/dL — ABNORMAL HIGH (ref 70–99)
Glucose-Capillary: 120 mg/dL — ABNORMAL HIGH (ref 70–99)
Glucose-Capillary: 157 mg/dL — ABNORMAL HIGH (ref 70–99)
Glucose-Capillary: 142 mg/dL — ABNORMAL HIGH (ref 70–99)

## 2013-08-02 LAB — CREATININE, SERUM
Creatinine, Ser: 0.72 mg/dL (ref 0.50–1.35)
GFR calc Af Amer: >90 mL/min (ref >90)
GFR calc non Af Amer: >90 mL/min (ref >90)

## 2013-08-02 LAB — POCT I-STAT GLUCOSE
Glucose, Bld: 286 mg/dL — ABNORMAL HIGH (ref 70–99)
Operator id: 3390

## 2013-08-02 LAB — MAGNESIUM: Magnesium: 2.4 mg/dL (ref 1.5–2.5)

## 2013-08-02 LAB — APTT: aPTT: 40 seconds — ABNORMAL HIGH (ref 24–37)

## 2013-08-02 LAB — PLATELET COUNT: Platelets: 145 10*3/uL — ABNORMAL LOW (ref 150–400)

## 2013-08-02 SURGERY — CORONARY ARTERY BYPASS GRAFTING (CABG)
Anesthesia: General | Site: Leg Upper | Laterality: Right

## 2013-08-02 MED ORDER — FAMOTIDINE IN NACL 20-0.9 MG/50ML-% IV SOLN
20.0000 mg | Freq: Two times a day (BID) | INTRAVENOUS | Status: AC
  Filled 2013-08-02 (×2): qty 50

## 2013-08-02 MED ORDER — LACTATED RINGERS IV SOLN
INTRAVENOUS | Status: DC | PRN
  Administered 2013-08-02 (×2): via INTRAVENOUS

## 2013-08-02 MED ORDER — POTASSIUM CHLORIDE 10 MEQ/50ML IV SOLN
10.0000 meq | INTRAVENOUS | Status: AC
  Administered 2013-08-02 (×3): 10 meq via INTRAVENOUS

## 2013-08-02 MED ORDER — METOPROLOL TARTRATE 25 MG/10 ML ORAL SUSPENSION
12.5000 mg | Freq: Two times a day (BID) | ORAL | Status: DC
  Filled 2013-08-02 (×5): qty 5

## 2013-08-02 MED ORDER — PANTOPRAZOLE SODIUM 40 MG PO TBEC
40.0000 mg | DELAYED_RELEASE_TABLET | Freq: Every day | ORAL | Status: DC
  Administered 2013-08-04 – 2013-08-08 (×5): 40 mg via ORAL
  Filled 2013-08-02 (×6): qty 1

## 2013-08-02 MED ORDER — SODIUM CHLORIDE 0.9 % IV SOLN
INTRAVENOUS | Status: DC | PRN
  Administered 2013-08-02: 13:00:00 via INTRAVENOUS

## 2013-08-02 MED ORDER — MILRINONE IN DEXTROSE 20 MG/100ML IV SOLN
INTRAVENOUS | Status: DC | PRN
  Administered 2013-08-02: 152 ug/kg/min via INTRAVENOUS

## 2013-08-02 MED ORDER — SODIUM CHLORIDE 0.9 % IJ SOLN
3.0000 mL | Freq: Two times a day (BID) | INTRAMUSCULAR | Status: DC
  Administered 2013-08-03 – 2013-08-05 (×5): 3 mL via INTRAVENOUS

## 2013-08-02 MED ORDER — MILRINONE IN DEXTROSE 20 MG/100ML IV SOLN
0.2500 ug/kg/min | INTRAVENOUS | Status: DC
  Filled 2013-08-02: qty 100

## 2013-08-02 MED ORDER — SODIUM CHLORIDE 0.9 % IV SOLN
10.0000 g | INTRAVENOUS | Status: DC | PRN
  Administered 2013-08-02: 5 g/h via INTRAVENOUS

## 2013-08-02 MED ORDER — PHENYLEPHRINE HCL 10 MG/ML IJ SOLN
0.0000 ug/min | INTRAMUSCULAR | Status: DC
  Administered 2013-08-02: 10 ug/min via INTRAVENOUS
  Administered 2013-08-03: 20 ug/min via INTRAVENOUS
  Filled 2013-08-02 (×2): qty 2

## 2013-08-02 MED ORDER — ASPIRIN 81 MG PO CHEW
324.0000 mg | CHEWABLE_TABLET | Freq: Every day | ORAL | Status: DC
  Administered 2013-08-03: 324 mg
  Filled 2013-08-02: qty 4

## 2013-08-02 MED ORDER — ACETAMINOPHEN 500 MG PO TABS
1000.0000 mg | ORAL_TABLET | Freq: Four times a day (QID) | ORAL | Status: AC
  Administered 2013-08-02 – 2013-08-07 (×19): 1000 mg via ORAL
  Filled 2013-08-02 (×25): qty 2

## 2013-08-02 MED ORDER — ASPIRIN EC 325 MG PO TBEC
325.0000 mg | DELAYED_RELEASE_TABLET | Freq: Every day | ORAL | Status: DC
  Filled 2013-08-02 (×2): qty 1

## 2013-08-02 MED ORDER — METOPROLOL TARTRATE 12.5 MG HALF TABLET
12.5000 mg | ORAL_TABLET | Freq: Two times a day (BID) | ORAL | Status: DC
  Administered 2013-08-03 (×2): 12.5 mg via ORAL
  Filled 2013-08-02 (×5): qty 1

## 2013-08-02 MED ORDER — METOCLOPRAMIDE HCL 5 MG/ML IJ SOLN
10.0000 mg | Freq: Four times a day (QID) | INTRAMUSCULAR | Status: AC
  Administered 2013-08-02 – 2013-08-03 (×4): 10 mg via INTRAVENOUS
  Filled 2013-08-02 (×4): qty 2

## 2013-08-02 MED ORDER — ONDANSETRON HCL 4 MG/2ML IJ SOLN
4.0000 mg | Freq: Four times a day (QID) | INTRAMUSCULAR | Status: DC | PRN
  Administered 2013-08-03: 4 mg via INTRAVENOUS
  Filled 2013-08-02: qty 2

## 2013-08-02 MED ORDER — MAGNESIUM SULFATE 40 MG/ML IJ SOLN
4.0000 g | Freq: Once | INTRAMUSCULAR | Status: AC
  Administered 2013-08-02: 4 g via INTRAVENOUS

## 2013-08-02 MED ORDER — SODIUM CHLORIDE 0.9 % IV SOLN
100.0000 [IU] | INTRAVENOUS | Status: DC | PRN
  Administered 2013-08-02: 6.4 [IU]/h via INTRAVENOUS

## 2013-08-02 MED ORDER — INSULIN REGULAR BOLUS VIA INFUSION
0.0000 [IU] | Freq: Three times a day (TID) | INTRAVENOUS | Status: DC
  Filled 2013-08-02: qty 10

## 2013-08-02 MED ORDER — SODIUM CHLORIDE 0.9 % IV SOLN
200.0000 ug | INTRAVENOUS | Status: DC | PRN
  Administered 2013-08-02: 0.2 ug/kg/h via INTRAVENOUS

## 2013-08-02 MED ORDER — MORPHINE SULFATE 2 MG/ML IJ SOLN
1.0000 mg | INTRAMUSCULAR | Status: AC | PRN
  Administered 2013-08-02: 2 mg via INTRAVENOUS
  Filled 2013-08-02: qty 1

## 2013-08-02 MED ORDER — MAGNESIUM SULFATE 40 MG/ML IJ SOLN
INTRAMUSCULAR | Status: AC
  Filled 2013-08-02: qty 100

## 2013-08-02 MED ORDER — DOCUSATE SODIUM 100 MG PO CAPS
200.0000 mg | ORAL_CAPSULE | Freq: Every day | ORAL | Status: DC
  Administered 2013-08-03 – 2013-08-08 (×6): 200 mg via ORAL
  Filled 2013-08-02 (×6): qty 2

## 2013-08-02 MED ORDER — PHENYLEPHRINE HCL 10 MG/ML IJ SOLN
10.0000 mg | INTRAVENOUS | Status: DC | PRN
  Administered 2013-08-02: 40 ug/min via INTRAVENOUS

## 2013-08-02 MED ORDER — HEMOSTATIC AGENTS (NO CHARGE) OPTIME
TOPICAL | Status: DC | PRN
  Administered 2013-08-02: 1 via TOPICAL

## 2013-08-02 MED ORDER — METOPROLOL TARTRATE 1 MG/ML IV SOLN: 2.5000 mg | INTRAVENOUS | Status: DC | PRN

## 2013-08-02 MED ORDER — LACTATED RINGERS IV SOLN
INTRAVENOUS | Status: DC
  Administered 2013-08-02 (×2): via INTRAVENOUS

## 2013-08-02 MED ORDER — ALBUMIN HUMAN 5 % IV SOLN
INTRAVENOUS | Status: DC | PRN
  Administered 2013-08-02: 12:00:00 via INTRAVENOUS

## 2013-08-02 MED ORDER — DOPAMINE-DEXTROSE 3.2-5 MG/ML-% IV SOLN
2.0000 ug/kg/min | INTRAVENOUS | Status: AC
  Administered 2013-08-02: 3 ug/kg/min via INTRAVENOUS

## 2013-08-02 MED ORDER — NITROGLYCERIN IN D5W 200-5 MCG/ML-% IV SOLN: 0.0000 ug/min | INTRAVENOUS | Status: DC

## 2013-08-02 MED ORDER — MIDAZOLAM HCL 5 MG/5ML IJ SOLN
INTRAMUSCULAR | Status: DC | PRN
  Administered 2013-08-02: 4 mg via INTRAVENOUS
  Administered 2013-08-02 (×3): 3 mg via INTRAVENOUS
  Administered 2013-08-02: 2 mg via INTRAVENOUS
  Administered 2013-08-02: 5 mg via INTRAVENOUS

## 2013-08-02 MED ORDER — ACETAMINOPHEN 650 MG RE SUPP
650.0000 mg | Freq: Once | RECTAL | Status: AC
  Administered 2013-08-02: 650 mg via RECTAL

## 2013-08-02 MED ORDER — PROPOFOL 10 MG/ML IV BOLUS
INTRAVENOUS | Status: DC | PRN
  Administered 2013-08-02: 100 mg via INTRAVENOUS

## 2013-08-02 MED ORDER — SODIUM CHLORIDE 0.9 % IV SOLN
INTRAVENOUS | Status: DC
  Administered 2013-08-02: 13.2 [IU]/h via INTRAVENOUS
  Administered 2013-08-02 – 2013-08-03 (×2): via INTRAVENOUS
  Filled 2013-08-02 (×3): qty 1

## 2013-08-02 MED ORDER — NITROGLYCERIN IN D5W 200-5 MCG/ML-% IV SOLN
INTRAVENOUS | Status: DC | PRN
  Administered 2013-08-02: 5 ug/min via INTRAVENOUS

## 2013-08-02 MED ORDER — OXYCODONE HCL 5 MG PO TABS
5.0000 mg | ORAL_TABLET | ORAL | Status: DC | PRN
  Administered 2013-08-02 – 2013-08-03 (×5): 10 mg via ORAL
  Administered 2013-08-03: 5 mg via ORAL
  Administered 2013-08-04 – 2013-08-08 (×12): 10 mg via ORAL
  Filled 2013-08-02 (×2): qty 2
  Filled 2013-08-02: qty 1
  Filled 2013-08-02 (×15): qty 2

## 2013-08-02 MED ORDER — SODIUM CHLORIDE 0.9 % IJ SOLN
OROMUCOSAL | Status: DC | PRN
  Administered 2013-08-02: 09:00:00 via TOPICAL

## 2013-08-02 MED ORDER — BISACODYL 10 MG RE SUPP: 10.0000 mg | Freq: Every day | RECTAL | Status: DC

## 2013-08-02 MED ORDER — LACTATED RINGERS IV SOLN
INTRAVENOUS | Status: DC | PRN
  Administered 2013-08-02 (×2): via INTRAVENOUS

## 2013-08-02 MED ORDER — MIDAZOLAM HCL 2 MG/2ML IJ SOLN: 2.0000 mg | INTRAMUSCULAR | Status: DC | PRN

## 2013-08-02 MED ORDER — MORPHINE SULFATE 2 MG/ML IJ SOLN
2.0000 mg | INTRAMUSCULAR | Status: DC | PRN
  Administered 2013-08-02: 2 mg via INTRAVENOUS
  Administered 2013-08-02 (×3): 4 mg via INTRAVENOUS
  Administered 2013-08-03: 2 mg via INTRAVENOUS
  Administered 2013-08-03 (×2): 4 mg via INTRAVENOUS
  Administered 2013-08-03 (×2): 2 mg via INTRAVENOUS
  Administered 2013-08-03: 4 mg via INTRAVENOUS
  Administered 2013-08-03 (×2): 2 mg via INTRAVENOUS
  Filled 2013-08-02 (×2): qty 2
  Filled 2013-08-02 (×2): qty 1
  Filled 2013-08-02 (×2): qty 2
  Filled 2013-08-02: qty 1
  Filled 2013-08-02 (×2): qty 2
  Filled 2013-08-02: qty 1
  Filled 2013-08-02: qty 3

## 2013-08-02 MED ORDER — LIDOCAINE HCL (CARDIAC) 20 MG/ML IV SOLN
INTRAVENOUS | Status: DC | PRN
  Administered 2013-08-02: 70 mg via INTRAVENOUS

## 2013-08-02 MED ORDER — FENTANYL CITRATE 0.05 MG/ML IJ SOLN
INTRAMUSCULAR | Status: DC | PRN
  Administered 2013-08-02: 100 ug via INTRAVENOUS
  Administered 2013-08-02: 250 ug via INTRAVENOUS
  Administered 2013-08-02: 150 ug via INTRAVENOUS
  Administered 2013-08-02: 100 ug via INTRAVENOUS
  Administered 2013-08-02 (×3): 250 ug via INTRAVENOUS
  Administered 2013-08-02: 150 ug via INTRAVENOUS

## 2013-08-02 MED ORDER — SODIUM CHLORIDE 0.9 % IV SOLN
INTRAVENOUS | Status: DC
  Administered 2013-08-02: 14:00:00 via INTRAVENOUS

## 2013-08-02 MED ORDER — DOPAMINE-DEXTROSE 3.2-5 MG/ML-% IV SOLN
INTRAVENOUS | Status: DC | PRN
  Administered 2013-08-02: 3 ug/kg/min via INTRAVENOUS

## 2013-08-02 MED ORDER — PROTAMINE SULFATE 10 MG/ML IV SOLN
INTRAVENOUS | Status: DC | PRN
  Administered 2013-08-02: 10 mg via INTRAVENOUS
  Administered 2013-08-02: 350 mg via INTRAVENOUS

## 2013-08-02 MED ORDER — HEPARIN SODIUM (PORCINE) 1000 UNIT/ML IJ SOLN
INTRAMUSCULAR | Status: DC | PRN
  Administered 2013-08-02: 36 mL via INTRAVENOUS

## 2013-08-02 MED ORDER — DEXTROSE 5 % IV SOLN
1.5000 g | Freq: Two times a day (BID) | INTRAVENOUS | Status: AC
  Administered 2013-08-02 – 2013-08-04 (×4): 1.5 g via INTRAVENOUS
  Filled 2013-08-02 (×4): qty 1.5

## 2013-08-02 MED ORDER — BISACODYL 5 MG PO TBEC
10.0000 mg | DELAYED_RELEASE_TABLET | Freq: Every day | ORAL | Status: DC
  Administered 2013-08-03 – 2013-08-07 (×5): 10 mg via ORAL
  Filled 2013-08-02 (×5): qty 2

## 2013-08-02 MED ORDER — ROCURONIUM BROMIDE 100 MG/10ML IV SOLN
INTRAVENOUS | Status: DC | PRN
  Administered 2013-08-02 (×2): 50 mg via INTRAVENOUS
  Administered 2013-08-02: 100 mg via INTRAVENOUS
  Administered 2013-08-02: 50 mg via INTRAVENOUS

## 2013-08-02 MED ORDER — VANCOMYCIN HCL IN DEXTROSE 1-5 GM/200ML-% IV SOLN
1000.0000 mg | Freq: Once | INTRAVENOUS | Status: AC
  Administered 2013-08-02: 1000 mg via INTRAVENOUS
  Filled 2013-08-02: qty 200

## 2013-08-02 MED ORDER — ACETAMINOPHEN 160 MG/5ML PO SOLN
1000.0000 mg | Freq: Four times a day (QID) | ORAL | Status: AC
  Filled 2013-08-02: qty 40

## 2013-08-02 MED ORDER — LACTATED RINGERS IV SOLN: 500.0000 mL | Freq: Once | INTRAVENOUS | Status: AC | PRN

## 2013-08-02 MED ORDER — SODIUM CHLORIDE 0.9 % IJ SOLN: 3.0000 mL | INTRAMUSCULAR | Status: DC | PRN

## 2013-08-02 MED ORDER — SODIUM CHLORIDE 0.9 % IV SOLN: 250.0000 mL | INTRAVENOUS | Status: DC

## 2013-08-02 MED ORDER — EPHEDRINE SULFATE 50 MG/ML IJ SOLN
INTRAMUSCULAR | Status: DC | PRN
  Administered 2013-08-02: 2.5 mg via INTRAVENOUS

## 2013-08-02 MED ORDER — MILRINONE IN DEXTROSE 20 MG/100ML IV SOLN
0.1250 ug/kg/min | INTRAVENOUS | Status: AC
  Administered 2013-08-02 – 2013-08-03 (×2): 0.3 ug/kg/min via INTRAVENOUS
  Administered 2013-08-03: 0.125 ug/kg/min via INTRAVENOUS
  Filled 2013-08-02 (×2): qty 100

## 2013-08-02 MED ORDER — SODIUM CHLORIDE 0.45 % IV SOLN
INTRAVENOUS | Status: DC
  Administered 2013-08-02: 14:00:00 via INTRAVENOUS

## 2013-08-02 MED ORDER — DEXMEDETOMIDINE HCL IN NACL 200 MCG/50ML IV SOLN
0.4000 ug/kg/h | INTRAVENOUS | Status: DC
  Administered 2013-08-02: 0.7 ug/kg/h via INTRAVENOUS
  Filled 2013-08-02: qty 50

## 2013-08-02 MED ORDER — ACETAMINOPHEN 160 MG/5ML PO SOLN: 650.0000 mg | Freq: Once | ORAL | Status: AC

## 2013-08-02 MED ORDER — LACTATED RINGERS IV SOLN
INTRAVENOUS | Status: DC | PRN
  Administered 2013-08-02: 07:00:00 via INTRAVENOUS

## 2013-08-02 MED ORDER — 0.9 % SODIUM CHLORIDE (POUR BTL) OPTIME
TOPICAL | Status: DC | PRN
  Administered 2013-08-02: 6000 mL

## 2013-08-02 MED ORDER — DEXMEDETOMIDINE HCL IN NACL 200 MCG/50ML IV SOLN
0.1000 ug/kg/h | INTRAVENOUS | Status: DC
  Administered 2013-08-02: 0.6 ug/kg/h via INTRAVENOUS
  Administered 2013-08-02: 0.3 ug/kg/h via INTRAVENOUS
  Filled 2013-08-02: qty 50

## 2013-08-02 MED ORDER — SUCCINYLCHOLINE CHLORIDE 20 MG/ML IJ SOLN
INTRAMUSCULAR | Status: DC | PRN
  Administered 2013-08-02: 140 mg via INTRAVENOUS

## 2013-08-02 MED ORDER — ALBUMIN HUMAN 5 % IV SOLN
250.0000 mL | INTRAVENOUS | Status: AC | PRN
  Administered 2013-08-02 (×2): 250 mL via INTRAVENOUS
  Filled 2013-08-02: qty 250

## 2013-08-02 MED FILL — Heparin Sodium (Porcine) Inj 1000 Unit/ML: INTRAMUSCULAR | Qty: 30 | Status: AC

## 2013-08-02 MED FILL — Electrolyte-R (PH 7.4) Solution: INTRAVENOUS | Qty: 3000 | Status: AC

## 2013-08-02 MED FILL — Lidocaine HCl IV Inj 20 MG/ML: INTRAVENOUS | Qty: 5 | Status: AC

## 2013-08-02 MED FILL — Sodium Chloride IV Soln 0.9%: INTRAVENOUS | Qty: 1000 | Status: AC

## 2013-08-02 MED FILL — Sodium Bicarbonate IV Soln 8.4%: INTRAVENOUS | Qty: 50 | Status: AC

## 2013-08-02 MED FILL — Mannitol IV Soln 20%: INTRAVENOUS | Qty: 500 | Status: AC

## 2013-08-02 SURGICAL SUPPLY — 82 items
ADH SKN CLS APL DERMABOND .7 (GAUZE/BANDAGES/DRESSINGS) ×3
ATTRACTOMAT 16X20 MAGNETIC DRP (DRAPES) ×4 IMPLANT
BAG DECANTER FOR FLEXI CONT (MISCELLANEOUS) ×4 IMPLANT
BANDAGE ELASTIC 4 VELCRO ST LF (GAUZE/BANDAGES/DRESSINGS) ×4 IMPLANT
BANDAGE ELASTIC 6 VELCRO ST LF (GAUZE/BANDAGES/DRESSINGS) ×4 IMPLANT
BANDAGE GAUZE ELAST BULKY 4 IN (GAUZE/BANDAGES/DRESSINGS) ×4 IMPLANT
BLADE STERNUM SYSTEM 6 (BLADE) ×4 IMPLANT
BLADE SURG 11 STRL SS (BLADE) ×1 IMPLANT
BNDG GAUZE ELAST 4 BULKY (GAUZE/BANDAGES/DRESSINGS) ×1 IMPLANT
CANISTER SUCTION 2500CC (MISCELLANEOUS) ×4 IMPLANT
CANN PRFSN .5XCNCT 15X34-48 (MISCELLANEOUS)
CANNULA AORTIC HI-FLOW 6.5M20F (CANNULA) ×3 IMPLANT
CANNULA PRFSN .5XCNCT 15X34-48 (MISCELLANEOUS) ×3 IMPLANT
CANNULA VEN 2 STAGE (MISCELLANEOUS)
CANNULA VESSEL 3MM 2 BLNT TIP (CANNULA) ×3 IMPLANT
CATH CPB KIT GERHARDT (MISCELLANEOUS) ×4 IMPLANT
CATH THORACIC 28FR (CATHETERS) ×4 IMPLANT
COVER SURGICAL LIGHT HANDLE (MISCELLANEOUS) ×4 IMPLANT
CRADLE DONUT ADULT HEAD (MISCELLANEOUS) ×4 IMPLANT
DERMABOND ADVANCED (GAUZE/BANDAGES/DRESSINGS) ×1
DERMABOND ADVANCED .7 DNX12 (GAUZE/BANDAGES/DRESSINGS) IMPLANT
DRAIN CHANNEL 28F RND 3/8 FF (WOUND CARE) ×4 IMPLANT
DRAPE CARDIOVASCULAR INCISE (DRAPES) ×4
DRAPE SLUSH/WARMER DISC (DRAPES) ×4 IMPLANT
DRAPE SRG 135X102X78XABS (DRAPES) ×3 IMPLANT
DRSG AQUACEL AG ADV 3.5X14 (GAUZE/BANDAGES/DRESSINGS) ×4 IMPLANT
ELECT BLADE 4.0 EZ CLEAN MEGAD (MISCELLANEOUS) ×4
ELECT REM PT RETURN 9FT ADLT (ELECTROSURGICAL) ×8
ELECTRODE BLDE 4.0 EZ CLN MEGD (MISCELLANEOUS) ×3 IMPLANT
ELECTRODE REM PT RTRN 9FT ADLT (ELECTROSURGICAL) ×6 IMPLANT
GAUZE SPONGE 4X4 16PLY XRAY LF (GAUZE/BANDAGES/DRESSINGS) ×3 IMPLANT
GLOVE BIO SURGEON STRL SZ 6 (GLOVE) ×7 IMPLANT
GLOVE BIO SURGEON STRL SZ 6.5 (GLOVE) ×13 IMPLANT
GLOVE BIO SURGEON STRL SZ8 (GLOVE) ×2 IMPLANT
GLOVE BIOGEL PI IND STRL 6 (GLOVE) IMPLANT
GLOVE BIOGEL PI IND STRL 7.0 (GLOVE) IMPLANT
GLOVE BIOGEL PI INDICATOR 6 (GLOVE) ×4
GLOVE BIOGEL PI INDICATOR 7.0 (GLOVE) ×4
GOWN STRL NON-REIN LRG LVL3 (GOWN DISPOSABLE) ×21 IMPLANT
HEMOSTAT POWDER SURGIFOAM 1G (HEMOSTASIS) ×12 IMPLANT
HEMOSTAT SURGICEL 2X14 (HEMOSTASIS) ×4 IMPLANT
INSERT FOGARTY 61MM (MISCELLANEOUS) ×1 IMPLANT
KIT BASIN OR (CUSTOM PROCEDURE TRAY) ×4 IMPLANT
KIT ROOM TURNOVER OR (KITS) ×4 IMPLANT
KIT SUCTION CATH 14FR (SUCTIONS) ×8 IMPLANT
KIT VASOVIEW W/TROCAR VH 2000 (KITS) ×4 IMPLANT
LEAD PACING MYOCARDI (MISCELLANEOUS) ×4 IMPLANT
MARKER GRAFT CORONARY BYPASS (MISCELLANEOUS) ×12 IMPLANT
NS IRRIG 1000ML POUR BTL (IV SOLUTION) ×21 IMPLANT
PACK OPEN HEART (CUSTOM PROCEDURE TRAY) ×4 IMPLANT
PAD ARMBOARD 7.5X6 YLW CONV (MISCELLANEOUS) ×8 IMPLANT
PAD ELECT DEFIB RADIOL ZOLL (MISCELLANEOUS) ×4 IMPLANT
PENCIL BUTTON HOLSTER BLD 10FT (ELECTRODE) ×4 IMPLANT
PUNCH AORTIC ROTATE 4.0MM (MISCELLANEOUS) ×1 IMPLANT
PUNCH AORTIC ROTATE 4.5MM 8IN (MISCELLANEOUS) ×1 IMPLANT
SET CARDIOPLEGIA MPS 5001102 (MISCELLANEOUS) ×1 IMPLANT
SOLUTION ANTI FOG 6CC (MISCELLANEOUS) ×1 IMPLANT
SPONGE GAUZE 4X4 12PLY (GAUZE/BANDAGES/DRESSINGS) ×8 IMPLANT
SPONGE LAP 18X18 X RAY DECT (DISPOSABLE) ×1 IMPLANT
SUT BONE WAX W31G (SUTURE) ×4 IMPLANT
SUT MNCRL AB 4-0 PS2 18 (SUTURE) ×1 IMPLANT
SUT PROLENE 3 0 SH1 36 (SUTURE) ×4 IMPLANT
SUT PROLENE 4 0 TF (SUTURE) ×8 IMPLANT
SUT PROLENE 6 0 CC (SUTURE) ×9 IMPLANT
SUT PROLENE 7 0 BV1 MDA (SUTURE) ×6 IMPLANT
SUT PROLENE 7.0 RB 3 (SUTURE) ×2 IMPLANT
SUT PROLENE 8 0 BV175 6 (SUTURE) ×7 IMPLANT
SUT STEEL 6MS V (SUTURE) ×4 IMPLANT
SUT STEEL SZ 6 DBL 3X14 BALL (SUTURE) ×4 IMPLANT
SUT VIC AB 1 CTX 18 (SUTURE) ×8 IMPLANT
SUT VIC AB 2-0 CT1 27 (SUTURE) ×4
SUT VIC AB 2-0 CT1 TAPERPNT 27 (SUTURE) IMPLANT
SUTURE E-PAK OPEN HEART (SUTURE) ×4 IMPLANT
SYSTEM SAHARA CHEST DRAIN ATS (WOUND CARE) ×4 IMPLANT
TAPE CLOTH SURG 4X10 WHT LF (GAUZE/BANDAGES/DRESSINGS) ×2 IMPLANT
TOWEL OR 17X24 6PK STRL BLUE (TOWEL DISPOSABLE) ×8 IMPLANT
TOWEL OR 17X26 10 PK STRL BLUE (TOWEL DISPOSABLE) ×8 IMPLANT
TRAY FOLEY IC TEMP SENS 14FR (CATHETERS) ×4 IMPLANT
TUBE FEEDING 8FR 16IN STR KANG (MISCELLANEOUS) ×4 IMPLANT
TUBING INSUFFLATION 10FT LAP (TUBING) ×5 IMPLANT
UNDERPAD 30X30 INCONTINENT (UNDERPADS AND DIAPERS) ×4 IMPLANT
WATER STERILE IRR 1000ML POUR (IV SOLUTION) ×8 IMPLANT

## 2013-08-02 NOTE — Progress Notes (Signed)
Echocardiogram Echocardiogram Transesophageal has been performed.  Alex Yang 08/02/2013, 10:09 AM

## 2013-08-02 NOTE — Procedures (Signed)
Extubation Procedure Note  Patient Details:   Name: DAKSH COATES DOB: Sep 06, 1955 MRN: [id number]   Airway Documentation:  Pt extubated following Rapid Wean Protocol Pre extubation: ABG pH 7.37, CO2 41.5, PO2 72, NIF -30 VC 1.0, Pt follows all commands Post Extubation: Pt placed on 5L [place] Sats 95%, Pt able to verbalize name/location. No Stridor. RT will continue to monitor.    Evaluation  O2 sats: stable throughout Complications: No apparent complications Patient did tolerate procedure well. Bilateral Breath Sounds: Clear;Diminished   Yes  Elmer Picker 08/02/2013, 7:55 PM

## 2013-08-02 NOTE — OR Nursing (Signed)
Sacral dressing applied before transport to SICU

## 2013-08-02 NOTE — Preoperative (Signed)
Beta Blockers   Reason not to administer Beta Blockers:Not Applicable, Pt took Metoprolol at 0556 this am

## 2013-08-02 NOTE — Anesthesia Preprocedure Evaluation (Addendum)
Anesthesia Evaluation  Patient identified by MRN, date of birth, ID band Patient awake    Reviewed: Allergy & Precautions, H&P , NPO status , Patient's Chart, lab work & pertinent test results  Airway Mallampati: II      Dental   Pulmonary neg pulmonary ROS,  breath sounds clear to auscultation        Cardiovascular + Past MI Rhythm:Regular Rate:Normal     Neuro/Psych    GI/Hepatic negative GI ROS, Neg liver ROS,   Endo/Other  diabetes  Renal/GU negative Renal ROS     Musculoskeletal   Abdominal   Peds  Hematology   Anesthesia Other Findings   Reproductive/Obstetrics                          Anesthesia Physical Anesthesia Plan  ASA: III  Anesthesia Plan: General   Post-op Pain Management:    Induction: Intravenous  Airway Management Planned: Oral ETT  Additional Equipment: Arterial line, PA Cath and TEE  Intra-op Plan:   Post-operative Plan: Extubation in OR  Informed Consent: I have reviewed the patients History and Physical, chart, labs and discussed the procedure including the risks, benefits and alternatives for the proposed anesthesia with the patient or authorized representative who has indicated his/her understanding and acceptance.   Dental advisory given  Plan Discussed with: CRNA, Anesthesiologist and Surgeon  Anesthesia Plan Comments:        Anesthesia Quick Evaluation

## 2013-08-02 NOTE — Transfer of Care (Signed)
Immediate Anesthesia Transfer of Care Note  Patient: Alex Yang  Procedure(s) Performed: Procedure(s) with comments: CORONARY ARTERY BYPASS GRAFTING (CABG) (N/A) - Coronary artery bypass graft times five utilizing the left internal mammary artery and the right greater saphenous vein. INTRAOPERATIVE TRANSESOPHAGEAL ECHOCARDIOGRAM (N/A) ENDOVEIN HARVEST OF GREATER SAPHENOUS VEIN (Right)  Patient Location: SICU  Anesthesia Type:General  Level of Consciousness: sedated  Airway & Oxygen Therapy: Patient remains intubated per anesthesia plan and Patient placed on Ventilator (see vital sign flow sheet for setting)  Post-op Assessment: Post -op Vital signs reviewed and stable and Report given to 2300 RN  Post vital signs: Reviewed and stable  Complications: No apparent anesthesia complications

## 2013-08-02 NOTE — Anesthesia Procedure Notes (Signed)
Procedure Name: Intubation Date/Time: 08/02/2013 7:49 AM Performed by: Rogelia Boga Pre-anesthesia Checklist: Patient identified, Emergency Drugs available, Suction available, Patient being monitored and Timeout performed Patient Re-evaluated:Patient Re-evaluated prior to inductionOxygen Delivery Method: Circle system utilized Preoxygenation: Pre-oxygenation with 100% oxygen Intubation Type: IV induction, Rapid sequence and Cricoid Pressure applied Laryngoscope Size: Mac and 4 Grade View: Grade II Tube type: Oral Tube size: 8.0 mm Number of attempts: 1 Airway Equipment and Method: Stylet Placement Confirmation: ETT inserted through vocal cords under direct vision,  positive ETCO2 and breath sounds checked- equal and bilateral Secured at: 22 cm Tube secured with: Tape Dental Injury: Teeth and Oropharynx as per pre-operative assessment

## 2013-08-02 NOTE — Progress Notes (Signed)
SICU vent wean protocol initiated, RN and MD aware.

## 2013-08-02 NOTE — Brief Op Note (Addendum)
      [address]       [name] [address]             [phone]        11:32 AM  PATIENT:  Alex Yang  57 y.o. male  PRE-OPERATIVE DIAGNOSIS:  CAD  POST-OPERATIVE DIAGNOSIS:  CAD  PROCEDURE:  Procedure(s) with comments: CORONARY ARTERY BYPASS GRAFTING x 5 -LIMA to LAD -SVG to RAMUS INTERMEDIATE -SEQUENTIAL SVG TO OM 1 AND  Distal  CIRCUMFLEX -SVG to PDA  ENDOVEIN HARVEST OF GREATER SAPHENOUS VEIN RIGHT LEG  INTRAOPERATIVE TRANSESOPHAGEAL ECHOCARDIOGRAM (N/A)  SURGEON:  Surgeon(s) and Role:    * Delight Ovens, MD - Primary  PHYSICIAN ASSISTANT: Erin Barrett PA-C  ANESTHESIA:   general  EBL:  Total I/O In: 2300 [I.V.:2300] Out: 450 [Urine:450]  BLOOD ADMINISTERED: CELLSAVER  DRAINS: Left Pleural Chest Tube, Mediastinal Chest Drains   LOCAL MEDICATIONS USED:  NONE  SPECIMEN:  No Specimen  DISPOSITION OF SPECIMEN:  N/A  COUNTS:  YES   DICTATION: .Dragon Dictation  PLAN OF CARE: Admit to inpatient   PATIENT DISPOSITION:  ICU - intubated and hemodynamically stable.   Delay start of Pharmacological VTE agent (>24hrs) due to surgical blood loss or risk of bleeding: yes

## 2013-08-02 NOTE — Progress Notes (Signed)
Patient ID: Alex Yang, male   DOB: May 21, 1956, 57 y.o.   MRN: [id number]  SICU Evening Rounds:   Hemodynamically stable  CI = 2.5 on dop and milrinone, neo  Weaning on vent.   Urine output good  CT output low  CBC    Component Value Date/Time   WBC 14.8* 08/02/2013 1400   RBC 4.01* 08/02/2013 1400   HGB 12.9* 08/02/2013 1403   HCT 38.0* 08/02/2013 1403   PLT 138* 08/02/2013 1400   MCV 90.8 08/02/2013 1400   MCH 31.9 08/02/2013 1400   MCHC 35.2 08/02/2013 1400   RDW 12.1 08/02/2013 1400   LYMPHSABS 1.9 07/29/2013 1553   MONOABS 0.6 07/29/2013 1553   EOSABS 0.1 07/29/2013 1553   BASOSABS 0.0 07/29/2013 1553     BMET    Component Value Date/Time   NA 140 08/02/2013 1403   K 3.6 08/02/2013 1403   CL 102 08/02/2013 0540   CO2 20 08/02/2013 0540   GLUCOSE 225* 08/02/2013 1403   BUN 11 08/02/2013 0540   CREATININE 0.85 08/02/2013 0540   CALCIUM 8.5 08/02/2013 0540   GFRNONAA >90 08/02/2013 0540   GFRAA >90 08/02/2013 0540     A/P:  Stable postop course. Continue current plans

## 2013-08-03 ENCOUNTER — Inpatient Hospital Stay (HOSPITAL_COMMUNITY): Payer: Self-pay

## 2013-08-03 LAB — GLUCOSE, CAPILLARY
Glucose-Capillary: 100 mg/dL — ABNORMAL HIGH (ref 70–99)
Glucose-Capillary: 103 mg/dL — ABNORMAL HIGH (ref 70–99)
Glucose-Capillary: 103 mg/dL — ABNORMAL HIGH (ref 70–99)
Glucose-Capillary: 104 mg/dL — ABNORMAL HIGH (ref 70–99)
Glucose-Capillary: 106 mg/dL — ABNORMAL HIGH (ref 70–99)
Glucose-Capillary: 122 mg/dL — ABNORMAL HIGH (ref 70–99)
Glucose-Capillary: 96 mg/dL (ref 70–99)
Glucose-Capillary: 102 mg/dL — ABNORMAL HIGH (ref 70–99)
Glucose-Capillary: 105 mg/dL — ABNORMAL HIGH (ref 70–99)
Glucose-Capillary: 108 mg/dL — ABNORMAL HIGH (ref 70–99)
Glucose-Capillary: 111 mg/dL — ABNORMAL HIGH (ref 70–99)
Glucose-Capillary: 117 mg/dL — ABNORMAL HIGH (ref 70–99)
Glucose-Capillary: 119 mg/dL — ABNORMAL HIGH (ref 70–99)
Glucose-Capillary: 120 mg/dL — ABNORMAL HIGH (ref 70–99)
Glucose-Capillary: 84 mg/dL (ref 70–99)
Glucose-Capillary: 90 mg/dL (ref 70–99)
Glucose-Capillary: 97 mg/dL (ref 70–99)
Glucose-Capillary: 118 mg/dL — ABNORMAL HIGH (ref 70–99)

## 2013-08-03 LAB — CBC
HCT: 35.8 % — ABNORMAL LOW (ref 39.0–52.0)
HCT: 37.5 % — ABNORMAL LOW (ref 39.0–52.0)
Hemoglobin: 12.5 g/dL — ABNORMAL LOW (ref 13.0–17.0)
Hemoglobin: 12.9 g/dL — ABNORMAL LOW (ref 13.0–17.0)
MCH: 31.5 pg (ref 26.0–34.0)
MCH: 31.4 pg (ref 26.0–34.0)
MCHC: 34.9 g/dL (ref 30.0–36.0)
MCHC: 34.4 g/dL (ref 30.0–36.0)
MCV: 90.2 fL (ref 78.0–100.0)
MCV: 91.2 fL (ref 78.0–100.0)
Platelets: 175 10*3/uL (ref 150–400)
Platelets: 172 10*3/uL (ref 150–400)
RBC: 3.97 MIL/uL — ABNORMAL LOW (ref 4.22–5.81)
RBC: 4.11 MIL/uL — ABNORMAL LOW (ref 4.22–5.81)
RDW: 12.1 % (ref 11.5–15.5)
RDW: 12.6 % (ref 11.5–15.5)
WBC: 17.2 10*3/uL — ABNORMAL HIGH (ref 4.0–10.5)
WBC: 17.3 10*3/uL — ABNORMAL HIGH (ref 4.0–10.5)

## 2013-08-03 LAB — POCT I-STAT, CHEM 8
BUN: 13 mg/dL (ref 6–23)
Calcium, Ion: 1.15 mmol/L (ref 1.12–1.23)
Chloride: 103 mEq/L (ref 96–112)
Creatinine, Ser: 0.9 mg/dL (ref 0.50–1.35)
Glucose, Bld: 124 mg/dL — ABNORMAL HIGH (ref 70–99)
HCT: 38 % — ABNORMAL LOW (ref 39.0–52.0)
Hemoglobin: 12.9 g/dL — ABNORMAL LOW (ref 13.0–17.0)
Potassium: 4.2 mEq/L (ref 3.5–5.1)
Sodium: 138 mEq/L (ref 135–145)
TCO2: 23 mmol/L (ref 0–100)

## 2013-08-03 LAB — BASIC METABOLIC PANEL
BUN: 9 mg/dL (ref 6–23)
CO2: 25 mEq/L (ref 19–32)
Calcium: 7.8 mg/dL — ABNORMAL LOW (ref 8.4–10.5)
Chloride: 107 mEq/L (ref 96–112)
Creatinine, Ser: 0.77 mg/dL (ref 0.50–1.35)
GFR calc Af Amer: >90 mL/min (ref >90)
GFR calc non Af Amer: >90 mL/min (ref >90)
Glucose, Bld: 100 mg/dL — ABNORMAL HIGH (ref 70–99)
Potassium: 3.6 mEq/L (ref 3.5–5.1)
Sodium: 140 mEq/L (ref 135–145)

## 2013-08-03 LAB — MAGNESIUM
Magnesium: 2.2 mg/dL (ref 1.5–2.5)
Magnesium: 2.7 mg/dL — ABNORMAL HIGH (ref 1.5–2.5)

## 2013-08-03 LAB — CREATININE, SERUM
Creatinine, Ser: 0.89 mg/dL (ref 0.50–1.35)
GFR calc Af Amer: >90 mL/min (ref >90)
GFR calc non Af Amer: >90 mL/min (ref >90)

## 2013-08-03 MED ORDER — INSULIN DETEMIR 100 UNIT/ML ~~LOC~~ SOLN
24.0000 [IU] | Freq: Once | SUBCUTANEOUS | Status: AC
  Administered 2013-08-03: 24 [IU] via SUBCUTANEOUS
  Filled 2013-08-03: qty 0.24

## 2013-08-03 MED ORDER — POTASSIUM CHLORIDE CRYS ER 20 MEQ PO TBCR
30.0000 meq | EXTENDED_RELEASE_TABLET | Freq: Once | ORAL | Status: AC
  Administered 2013-08-03: 30 meq via ORAL
  Filled 2013-08-03 (×2): qty 1

## 2013-08-03 MED ORDER — CLOPIDOGREL BISULFATE 75 MG PO TABS
75.0000 mg | ORAL_TABLET | Freq: Every day | ORAL | Status: DC
  Administered 2013-08-04 – 2013-08-08 (×5): 75 mg via ORAL
  Filled 2013-08-03 (×7): qty 1

## 2013-08-03 MED ORDER — KETOROLAC TROMETHAMINE 15 MG/ML IJ SOLN
15.0000 mg | Freq: Four times a day (QID) | INTRAMUSCULAR | Status: DC
  Administered 2013-08-03: 15 mg via INTRAVENOUS

## 2013-08-03 MED ORDER — POTASSIUM CHLORIDE 10 MEQ/50ML IV SOLN
10.0000 meq | INTRAVENOUS | Status: AC | PRN
  Administered 2013-08-03 (×3): 10 meq via INTRAVENOUS
  Filled 2013-08-03: qty 50

## 2013-08-03 MED ORDER — INSULIN DETEMIR 100 UNIT/ML ~~LOC~~ SOLN
24.0000 [IU] | Freq: Every day | SUBCUTANEOUS | Status: DC
  Filled 2013-08-03: qty 0.24

## 2013-08-03 MED ORDER — ENOXAPARIN SODIUM 30 MG/0.3ML ~~LOC~~ SOLN
30.0000 mg | SUBCUTANEOUS | Status: DC
  Administered 2013-08-03 – 2013-08-07 (×5): 30 mg via SUBCUTANEOUS
  Filled 2013-08-03 (×8): qty 0.3

## 2013-08-03 MED ORDER — KETOROLAC TROMETHAMINE 15 MG/ML IJ SOLN
INTRAMUSCULAR | Status: AC
  Administered 2013-08-03: 15 mg via INTRAVENOUS
  Filled 2013-08-03: qty 1

## 2013-08-03 MED ORDER — FUROSEMIDE 10 MG/ML IJ SOLN
20.0000 mg | Freq: Two times a day (BID) | INTRAMUSCULAR | Status: AC
  Administered 2013-08-03 (×2): 20 mg via INTRAVENOUS
  Filled 2013-08-03 (×2): qty 2

## 2013-08-03 MED ORDER — INSULIN ASPART 100 UNIT/ML ~~LOC~~ SOLN
0.0000 [IU] | SUBCUTANEOUS | Status: DC
  Administered 2013-08-03 – 2013-08-04 (×3): 2 [IU] via SUBCUTANEOUS
  Administered 2013-08-04: 4 [IU] via SUBCUTANEOUS
  Administered 2013-08-04: 2 [IU] via SUBCUTANEOUS
  Administered 2013-08-04: 8 [IU] via SUBCUTANEOUS
  Administered 2013-08-04 – 2013-08-05 (×2): 4 [IU] via SUBCUTANEOUS
  Administered 2013-08-05: 2 [IU] via SUBCUTANEOUS
  Administered 2013-08-05: 4 [IU] via SUBCUTANEOUS
  Administered 2013-08-05 (×2): 8 [IU] via SUBCUTANEOUS
  Administered 2013-08-06: 2 [IU] via SUBCUTANEOUS
  Administered 2013-08-06: 16 [IU] via SUBCUTANEOUS
  Administered 2013-08-06: 17:00:00 via SUBCUTANEOUS
  Administered 2013-08-06 – 2013-08-07 (×4): 2 [IU] via SUBCUTANEOUS
  Administered 2013-08-07 – 2013-08-08 (×3): 4 [IU] via SUBCUTANEOUS
  Administered 2013-08-08: 8 [IU] via SUBCUTANEOUS
  Administered 2013-08-08: 2 [IU] via SUBCUTANEOUS

## 2013-08-03 MED FILL — Magnesium Sulfate Inj 50%: INTRAMUSCULAR | Qty: 10 | Status: AC

## 2013-08-03 MED FILL — Heparin Sodium (Porcine) Inj 1000 Unit/ML: INTRAMUSCULAR | Qty: 30 | Status: AC

## 2013-08-03 MED FILL — Potassium Chloride Inj 2 mEq/ML: INTRAVENOUS | Qty: 40 | Status: AC

## 2013-08-03 MED FILL — Dexmedetomidine HCl IV Soln 200 MCG/2ML: INTRAVENOUS | Qty: 2 | Status: AC

## 2013-08-03 MED FILL — Sodium Chloride IV Soln 0.9%: INTRAVENOUS | Qty: 1000 | Status: AC

## 2013-08-03 NOTE — Progress Notes (Signed)
Patient ID: Alex Yang, male   DOB: April 22, 1956, 57 y.o.   MRN: [id number] TCTS DAILY ICU PROGRESS NOTE                   [address]            [name] [address]          [phone]   1 Day Post-Op Procedure(s) (LRB): CORONARY ARTERY BYPASS GRAFTING (CABG) (N/A) INTRAOPERATIVE TRANSESOPHAGEAL ECHOCARDIOGRAM (N/A) ENDOVEIN HARVEST OF GREATER SAPHENOUS VEIN (Right)  Total Length of Stay:  LOS: 5 days   Subjective: Awake, alert, neuro intact, extubated last night, some nausea this am  Objective: Vital signs in last 24 hours: Temp:  [96.3 F (35.7 C)-100 F (37.8 C)] 98.6 F (37 C) (12/17 0645) Pulse Rate:  [31-102] 95 (12/17 0645) Cardiac Rhythm:  [-] Normal sinus rhythm (12/17 0600) Resp:  [0-30] 22 (12/17 0645) BP: (86-117)/(51-67) 105/60 mmHg (12/17 0600) SpO2:  [93 %-100 %] 99 % (12/17 0645) Arterial Line BP: (87-130)/(49-81) 113/57 mmHg (12/17 0645) FiO2 (%):  [40 %-60 %] 40 % (12/16 1900) Weight:  [242 lb 11.2 oz (110.088 kg)] 242 lb 11.2 oz (110.088 kg) (12/17 0500)  Filed Weights   07/29/13 1511 07/29/13 2226 08/03/13 0500  Weight: 224 lb (101.606 kg) 223 lb 15.8 oz (101.6 kg) 242 lb 11.2 oz (110.088 kg)    Weight change:    Hemodynamic parameters for last 24 hours: PAP: (20-36)/(8-29) 23/14 mmHg CO:  [4.8 L/min-6.5 L/min] 5.8 L/min CI:  [2.1 L/min/m2-2.9 L/min/m2] 2.5 L/min/m2  Intake/Output from previous day: 12/16 0701 - 12/17 0700 In: 7510.7 [P.O.:200; I.V.:5240.7; Blood:670; IV Piggyback:1400] Out: 5659 [Urine:3190; Blood:2119; Chest Tube:350]  Intake/Output this shift:    Current Meds: Scheduled Meds: . acetaminophen  1,000 mg Oral Q6H   Or  . acetaminophen (TYLENOL) oral liquid 160 mg/5 mL  1,000 mg Per Tube Q6H  . aspirin EC  325 mg Oral Daily   Or  . aspirin  324 mg Per Tube Daily  . bisacodyl  10 mg Oral Daily   Or  . bisacodyl  10 mg Rectal Daily  . cefUROXime (ZINACEF)  IV  1.5 g Intravenous Q12H  . docusate  sodium  200 mg Oral Daily  . famotidine (PEPCID) IV  20 mg Intravenous Q12H  . insulin regular  0-10 Units Intravenous TID WC  . metoCLOPramide (REGLAN) injection  10 mg Intravenous Q6H  . metoprolol tartrate  12.5 mg Oral BID   Or  . metoprolol tartrate  12.5 mg Per Tube BID  . [START ON 08/04/2013] pantoprazole  40 mg Oral Daily  . simvastatin  40 mg Oral q1800  . sodium chloride  3 mL Intravenous Q12H   Continuous Infusions: . sodium chloride 20 mL/hr at 08/02/13 1400  . sodium chloride 20 mL/hr at 08/02/13 1400  . sodium chloride    . dexmedetomidine Stopped (08/03/13 0400)  . DOPamine 3 mcg/kg/min (08/03/13 0700)  . insulin (NOVOLIN-R) infusion 11 Units/hr (08/02/13 1500)  . lactated ringers 20 mL/hr at 08/02/13 2307  . milrinone 0.3 mcg/kg/min (08/03/13 0600)  . nitroGLYCERIN Stopped (08/02/13 1400)  . phenylephrine (NEO-SYNEPHRINE) Adult infusion Stopped (08/03/13 0500)   PRN Meds:.albumin human, metoprolol, midazolam, morphine injection, ondansetron (ZOFRAN) IV, oxyCODONE, potassium chloride, sodium chloride  General appearance: alert, cooperative and mild distress Neurologic: intact Heart: regular rate and rhythm, S1, S2 normal, no murmur, click, rub or gallop Lungs: diminished breath sounds bibasilar Abdomen: soft, non-tender; bowel sounds  normal; no masses,  no organomegaly Extremities: extremities normal, atraumatic, no cyanosis or edema and Homans sign is negative, no sign of DVT Wound: sternum stable  Lab Results: CBC: Recent Labs  08/02/13 2000 08/02/13 2004 08/03/13 0405  WBC 15.5*  --  17.2*  HGB 12.6* 12.9* 12.5*  HCT 36.0* 38.0* 35.8*  PLT 169  --  175   BMET:  Recent Labs  08/02/13 0540  08/02/13 2004 08/03/13 0405  NA 135  < > 141 140  K 3.8  < > 3.8 3.6  CL 102  --  104 107  CO2 20  --   --  25  GLUCOSE 233*  < > 154* 100*  BUN 11  --  6 9  CREATININE 0.85  < > 0.80 0.77  CALCIUM 8.5  --   --  7.8*  < > = values in this interval not  displayed.  PT/INR:  Recent Labs  08/02/13 1400  LABPROT 16.8*  INR 1.40   Radiology: Dg Chest Portable 1 View  08/02/2013   CLINICAL DATA:  Postop.  EXAM: PORTABLE CHEST - 1 VIEW  COMPARISON:  07/29/2013  FINDINGS: Sequelae of interval CABG are identified. Endotracheal tube tip projects at the clavicular heads. Right jugular Swan-Ganz catheter tip overlies the main pulmonary artery. Left-sided chest tube is present with tip overlying the left lung apex. Mediastinal drain is in place. Enteric tube courses into the left upper abdomen. Lung volumes are mildly diminished with patchy bibasilar lung opacities now present. No definite pneumothorax is identified. Screws are again partially visualized in the left glenoid and humeral head.  IMPRESSION: 1. Interval CABG with support lines and tubes as above. 2. Mildly diminished lung volumes with bibasilar opacities, likely atelectasis.   Electronically Signed   By: Sebastian Ache   On: 08/02/2013 14:25     Assessment/Plan: S/P Procedure(s) (LRB): CORONARY ARTERY BYPASS GRAFTING (CABG) (N/A) INTRAOPERATIVE TRANSESOPHAGEAL ECHOCARDIOGRAM (N/A) ENDOVEIN HARVEST OF GREATER SAPHENOUS VEIN (Right) Mobilize Diuresis Diabetes control Continue foley due to strict I&O, patient in ICU and urinary output monitoring See progression orders New dx of dm, currently on insulin drip will convert to long acting insulin  Wendel Homeyer B 08/03/2013 7:11 AM

## 2013-08-03 NOTE — Anesthesia Postprocedure Evaluation (Signed)
  Anesthesia Post-op Note  Patient: Alex Yang  Procedure(s) Performed: Procedure(s) with comments: CORONARY ARTERY BYPASS GRAFTING (CABG) (N/A) - Coronary artery bypass graft times five utilizing the left internal mammary artery and the right greater saphenous vein. INTRAOPERATIVE TRANSESOPHAGEAL ECHOCARDIOGRAM (N/A) ENDOVEIN HARVEST OF GREATER SAPHENOUS VEIN (Right)  Patient Location: SICU  Anesthesia Type:General  Level of Consciousness: sedated  Airway and Oxygen Therapy: Patient remains intubated per anesthesia plan  Post-op Pain: mild  Post-op Assessment: Post-op Vital signs reviewed  Post-op Vital Signs: Reviewed  Complications: No apparent anesthesia complications

## 2013-08-03 NOTE — Progress Notes (Addendum)
TCTS DAILY ICU PROGRESS NOTE                   [address]            [name] [address]          [phone]   1 Day Post-Op Procedure(s) (LRB): CORONARY ARTERY BYPASS GRAFTING (CABG) (N/A) INTRAOPERATIVE TRANSESOPHAGEAL ECHOCARDIOGRAM (N/A) ENDOVEIN HARVEST OF GREATER SAPHENOUS VEIN (Right)  Total Length of Stay:  LOS: 5 days   Subjective: Patient with nausea earlier. Zofran relived. Has had a fair amount of incisional pain.  Objective: Vital signs in last 24 hours: Temp:  [96.3 F (35.7 C)-100 F (37.8 C)] 98.4 F (36.9 C) (12/17 0800) Pulse Rate:  [68-102] 96 (12/17 0800) Cardiac Rhythm:  [-] Normal sinus rhythm (12/17 0800) Resp:  [0-30] 23 (12/17 0800) BP: (86-117)/(51-67) 112/57 mmHg (12/17 0800) SpO2:  [93 %-100 %] 98 % (12/17 0800) Arterial Line BP: (87-132)/(49-81) 123/64 mmHg (12/17 0800) FiO2 (%):  [40 %-60 %] 40 % (12/16 1900) Weight:  [110.088 kg (242 lb 11.2 oz)] 110.088 kg (242 lb 11.2 oz) (12/17 0500)  Filed Weights   07/29/13 1511 07/29/13 2226 08/03/13 0500  Weight: 101.606 kg (224 lb) 101.6 kg (223 lb 15.8 oz) 110.088 kg (242 lb 11.2 oz)    Weight change:    Hemodynamic parameters for last 24 hours: PAP: (20-36)/(8-29) 28/20 mmHg CO:  [4.8 L/min-6.5 L/min] 5.8 L/min CI:  [2.1 L/min/m2-2.9 L/min/m2] 2.5 L/min/m2  Intake/Output from previous day: 12/16 0701 - 12/17 0700 In: 7539.8 [P.O.:200; I.V.:5269.8; Blood:670; IV Piggyback:1400] Out: 5694 [Urine:3225; Blood:2119; Chest Tube:350]  Intake/Output this shift: Total I/O In: 101.7 [I.V.:51.7; IV Piggyback:50] Out: 65 [Urine:45; Chest Tube:20]  Current Meds: Scheduled Meds: . acetaminophen  1,000 mg Oral Q6H   Or  . acetaminophen (TYLENOL) oral liquid 160 mg/5 mL  1,000 mg Per Tube Q6H  . aspirin EC  325 mg Oral Daily   Or  . aspirin  324 mg Per Tube Daily  . bisacodyl  10 mg Oral Daily   Or  . bisacodyl  10 mg Rectal Daily  . cefUROXime (ZINACEF)  IV  1.5 g  Intravenous Q12H  . [START ON 08/04/2013] clopidogrel  75 mg Oral Q breakfast  . docusate sodium  200 mg Oral Daily  . enoxaparin (LOVENOX) injection  30 mg Subcutaneous Q24H  . famotidine (PEPCID) IV  20 mg Intravenous Q12H  . insulin aspart  0-24 Units Subcutaneous Q4H  . insulin detemir  24 Units Subcutaneous Once  . [START ON 08/04/2013] insulin detemir  24 Units Subcutaneous Daily  . insulin regular  0-10 Units Intravenous TID WC  . metoCLOPramide (REGLAN) injection  10 mg Intravenous Q6H  . metoprolol tartrate  12.5 mg Oral BID   Or  . metoprolol tartrate  12.5 mg Per Tube BID  . [START ON 08/04/2013] pantoprazole  40 mg Oral Daily  . simvastatin  40 mg Oral q1800  . sodium chloride  3 mL Intravenous Q12H   Continuous Infusions: . sodium chloride 20 mL/hr at 08/02/13 1400  . sodium chloride 20 mL/hr at 08/02/13 1400  . sodium chloride    . dexmedetomidine Stopped (08/03/13 0400)  . DOPamine 3 mcg/kg/min (08/03/13 0800)  . insulin (NOVOLIN-R) infusion 11 Units/hr (08/02/13 1500)  . lactated ringers 20 mL/hr at 08/02/13 2307  . milrinone 0.125 mcg/kg/min (08/03/13 0800)  . nitroGLYCERIN Stopped (08/02/13 1400)  . phenylephrine (NEO-SYNEPHRINE) Adult infusion Stopped (08/03/13 0500)  PRN Meds:.albumin human, metoprolol, midazolam, morphine injection, ondansetron (ZOFRAN) IV, oxyCODONE, potassium chloride, sodium chloride  General appearance: alert, cooperative and no distress Neurologic: intact Heart: RRR, rub with chest tubes in place, no murmur Lungs: diminished at bases;no rales, wheezes, or rhonchi Abdomen: soft, non-tender; bowel sounds normal; no masses,  no organomegaly Extremities: SCDs in place Wound: LE dressing is clean and dry. Sternal dressing intacht  Lab Results: CBC: Recent Labs  08/02/13 2000 08/02/13 2004 08/03/13 0405  WBC 15.5*  --  17.2*  HGB 12.6* 12.9* 12.5*  HCT 36.0* 38.0* 35.8*  PLT 169  --  175   BMET:  Recent Labs  08/02/13 0540   08/02/13 2004 08/03/13 0405  NA 135  < > 141 140  K 3.8  < > 3.8 3.6  CL 102  --  104 107  CO2 20  --   --  25  GLUCOSE 233*  < > 154* 100*  BUN 11  --  6 9  CREATININE 0.85  < > 0.80 0.77  CALCIUM 8.5  --   --  7.8*  < > = values in this interval not displayed.  PT/INR:  Recent Labs  08/02/13 1400  LABPROT 16.8*  INR 1.40   Radiology: Dg Chest Portable 1 View In Am  08/03/2013   CLINICAL DATA:  Coronary artery bypass graft  EXAM: PORTABLE CHEST - 1 VIEW  COMPARISON:  08/02/2013  FINDINGS: There is a right-sided Swan-Ganz catheter with the tip at the pulmonary outflow tract. Interval removal of the enteric tube and endotracheal tube. There is a left-sided chest tube with the tip directed towards the apex. There is no pneumothorax. There is bibasilar airspace disease likely reflecting atelectasis and postsurgical changes. There is a trace left pleural effusion. Stable cardiomediastinal silhouette. Changes from CABG.  There is prior internal fixation of the left glenohumeral joint.  IMPRESSION: 1. Stable support lines and tubing.  No pneumothorax.  2. Bibasilar airspace disease likely reflecting atelectasis.   Electronically Signed   By: Elige Ko   On: 08/03/2013 08:07   Dg Chest Portable 1 View  08/02/2013   CLINICAL DATA:  Postop.  EXAM: PORTABLE CHEST - 1 VIEW  COMPARISON:  07/29/2013  FINDINGS: Sequelae of interval CABG are identified. Endotracheal tube tip projects at the clavicular heads. Right jugular Swan-Ganz catheter tip overlies the main pulmonary artery. Left-sided chest tube is present with tip overlying the left lung apex. Mediastinal drain is in place. Enteric tube courses into the left upper abdomen. Lung volumes are mildly diminished with patchy bibasilar lung opacities now present. No definite pneumothorax is identified. Screws are again partially visualized in the left glenoid and humeral head.  IMPRESSION: 1. Interval CABG with support lines and tubes as above. 2.  Mildly diminished lung volumes with bibasilar opacities, likely atelectasis.   Electronically Signed   By: Sebastian Ache   On: 08/02/2013 14:25     Assessment/Plan: S/P Procedure(s) (LRB): CORONARY ARTERY BYPASS GRAFTING (CABG) (N/A) INTRAOPERATIVE TRANSESOPHAGEAL ECHOCARDIOGRAM (N/A) ENDOVEIN HARVEST OF GREATER SAPHENOUS VEIN (Right)  1.CV-SR. EKG this am shows SR, possible LA enlargement. On Milrinone and Dopamine gttps, Lopressor 12.5 bid. 2.Pulmonary-Chest tubes with 370 cc since surgery. D/c mediasinal tube. Likely remove pleural tubes later this am. CXR this am shows no pneumothorax, bibasilar atelectasi, and cardiomegaly. 3. Volume overload-lasix 20 IV bid today 4. ABL anemia-H and H 12.5 and 35.8 this am 5. Supplement potassium  6.DM-CBGs 103/104/106. Pre op HGA1C 12.3. Will start Metformin when  tolerating po. Will schedule Levemir to wean off insulin drip.Will require follow up as an outpatient. 7. May need Toradol for pain relief 8.See progression orders   ZIMMERMAN,DONIELLE M PA-C 08/03/2013 8:27 AM

## 2013-08-03 NOTE — Progress Notes (Signed)
T. CTS p.m. Rounds  Out of bed and in chair several hours today Sinus rhythm off drips  P.m. Laboratory values reviewed and are satisfactory Excellent A. continue current care

## 2013-08-03 NOTE — Progress Notes (Signed)
Pt progressing well. Tele reviewed and he is in sinus rhythm. Agree with plavix since he presented with NSTEMI and thrombotic lesion in circumflex. Other cardiac meds appropriate (beta-blocker, statin, ASA). Appreciate care of cardiac surgery team.  Tonny Bollman 08/03/2013 11:46 AM

## 2013-08-04 ENCOUNTER — Inpatient Hospital Stay (HOSPITAL_COMMUNITY): Payer: Self-pay

## 2013-08-04 ENCOUNTER — Encounter (HOSPITAL_COMMUNITY): Payer: Self-pay | Admitting: Cardiothoracic Surgery

## 2013-08-04 LAB — GLUCOSE, CAPILLARY
Glucose-Capillary: 133 mg/dL — ABNORMAL HIGH (ref 70–99)
Glucose-Capillary: 207 mg/dL — ABNORMAL HIGH (ref 70–99)
Glucose-Capillary: 186 mg/dL — ABNORMAL HIGH (ref 70–99)
Glucose-Capillary: 146 mg/dL — ABNORMAL HIGH (ref 70–99)
Glucose-Capillary: 127 mg/dL — ABNORMAL HIGH (ref 70–99)
Glucose-Capillary: 182 mg/dL — ABNORMAL HIGH (ref 70–99)

## 2013-08-04 LAB — BASIC METABOLIC PANEL
BUN: 18 mg/dL (ref 6–23)
CO2: 25 mEq/L (ref 19–32)
Calcium: 8 mg/dL — ABNORMAL LOW (ref 8.4–10.5)
Chloride: 102 mEq/L (ref 96–112)
Creatinine, Ser: 1.14 mg/dL (ref 0.50–1.35)
GFR calc Af Amer: 80 mL/min — ABNORMAL LOW (ref >90)
GFR calc non Af Amer: 69 mL/min — ABNORMAL LOW (ref >90)
Glucose, Bld: 132 mg/dL — ABNORMAL HIGH (ref 70–99)
Potassium: 4.5 mEq/L (ref 3.5–5.1)
Sodium: 134 mEq/L — ABNORMAL LOW (ref 135–145)

## 2013-08-04 LAB — CBC
HCT: 34.1 % — ABNORMAL LOW (ref 39.0–52.0)
Hemoglobin: 11.4 g/dL — ABNORMAL LOW (ref 13.0–17.0)
MCH: 31.1 pg (ref 26.0–34.0)
MCHC: 33.4 g/dL (ref 30.0–36.0)
MCV: 93.2 fL (ref 78.0–100.0)
Platelets: 157 10*3/uL (ref 150–400)
RBC: 3.66 MIL/uL — ABNORMAL LOW (ref 4.22–5.81)
RDW: 12.7 % (ref 11.5–15.5)
WBC: 15.3 10*3/uL — ABNORMAL HIGH (ref 4.0–10.5)

## 2013-08-04 MED ORDER — FUROSEMIDE 10 MG/ML IJ SOLN: 40.0000 mg | Freq: Once | INTRAMUSCULAR | Status: DC

## 2013-08-04 MED ORDER — MORPHINE SULFATE 2 MG/ML IJ SOLN
2.0000 mg | Freq: Once | INTRAMUSCULAR | Status: AC
  Administered 2013-08-04: 2 mg via INTRAVENOUS
  Filled 2013-08-04: qty 1

## 2013-08-04 MED ORDER — SODIUM CHLORIDE 0.9 % IJ SOLN: 3.0000 mL | INTRAMUSCULAR | Status: DC | PRN

## 2013-08-04 MED ORDER — SODIUM CHLORIDE 0.9 % IV SOLN: 250.0000 mL | INTRAVENOUS | Status: DC | PRN

## 2013-08-04 MED ORDER — METOPROLOL TARTRATE 25 MG/10 ML ORAL SUSPENSION
25.0000 mg | Freq: Two times a day (BID) | ORAL | Status: DC
  Administered 2013-08-05: 25 mg
  Filled 2013-08-04 (×11): qty 10

## 2013-08-04 MED ORDER — ASPIRIN 81 MG PO CHEW: 324.0000 mg | CHEWABLE_TABLET | Freq: Every day | ORAL | Status: DC

## 2013-08-04 MED ORDER — METOPROLOL TARTRATE 25 MG PO TABS
25.0000 mg | ORAL_TABLET | Freq: Two times a day (BID) | ORAL | Status: DC
  Administered 2013-08-04 – 2013-08-08 (×8): 25 mg via ORAL
  Filled 2013-08-04 (×11): qty 1

## 2013-08-04 MED ORDER — GUAIFENESIN ER 600 MG PO TB12
600.0000 mg | ORAL_TABLET | Freq: Two times a day (BID) | ORAL | Status: DC
  Administered 2013-08-04 – 2013-08-08 (×8): 600 mg via ORAL
  Filled 2013-08-04 (×9): qty 1

## 2013-08-04 MED ORDER — INSULIN DETEMIR 100 UNIT/ML ~~LOC~~ SOLN
20.0000 [IU] | Freq: Every day | SUBCUTANEOUS | Status: DC
  Administered 2013-08-04 – 2013-08-05 (×2): 20 [IU] via SUBCUTANEOUS
  Filled 2013-08-04 (×3): qty 0.2

## 2013-08-04 MED ORDER — ASPIRIN EC 81 MG PO TBEC
81.0000 mg | DELAYED_RELEASE_TABLET | Freq: Every day | ORAL | Status: DC
  Administered 2013-08-04 – 2013-08-08 (×5): 81 mg via ORAL
  Filled 2013-08-04 (×5): qty 1

## 2013-08-04 MED ORDER — SODIUM CHLORIDE 0.9 % IJ SOLN
3.0000 mL | Freq: Two times a day (BID) | INTRAMUSCULAR | Status: DC
  Administered 2013-08-04 – 2013-08-07 (×7): 3 mL via INTRAVENOUS

## 2013-08-04 MED ORDER — POTASSIUM CHLORIDE CRYS ER 20 MEQ PO TBCR
20.0000 meq | EXTENDED_RELEASE_TABLET | Freq: Every day | ORAL | Status: DC
  Administered 2013-08-05 – 2013-08-08 (×4): 20 meq via ORAL
  Filled 2013-08-04 (×4): qty 1

## 2013-08-04 MED ORDER — MOVING RIGHT ALONG BOOK
Freq: Once | Status: AC
  Administered 2013-08-04: 08:00:00
  Filled 2013-08-04: qty 1

## 2013-08-04 MED ORDER — FUROSEMIDE 40 MG PO TABS
40.0000 mg | ORAL_TABLET | Freq: Every day | ORAL | Status: DC
  Filled 2013-08-04: qty 1

## 2013-08-04 NOTE — Progress Notes (Signed)
[id number] Came to see pt to offer to walk. Eating lunch. Wants to try to rest a little before walking. Walked unit this morning. Encouraged pt to walk with staff later after he has rested. Will  Follow up tomorrow. Luetta Nutting RN BSN 08/04/2013 2:33 PM

## 2013-08-04 NOTE — Progress Notes (Addendum)
[facility] DAILY ICU PROGRESS NOTE                   [address]            [place] [address]          [phone]   2 Days Post-Op Procedure(s) (LRB): CORONARY ARTERY BYPASS GRAFTING (CABG) (N/A) INTRAOPERATIVE TRANSESOPHAGEAL ECHOCARDIOGRAM (N/A) ENDOVEIN HARVEST OF GREATER SAPHENOUS VEIN (Right)  Total Length of Stay:  LOS: 6 days   Subjective: Patient with productive cough.  Objective: Vital signs in last 24 hours: Temp:  [97.8 F (36.6 C)-98.4 F (36.9 C)] 98 F (36.7 C) (12/18 0400) Pulse Rate:  [91-117] 95 (12/18 0700) Cardiac Rhythm:  [-] Normal sinus rhythm (12/18 0400) Resp:  [16-29] 23 (12/18 0700) BP: (99-128)/(57-80) 117/69 mmHg (12/18 0700) SpO2:  [95 %-100 %] 100 % (12/18 0700) Arterial Line BP: (72-152)/(47-84) 93/62 mmHg (12/17 1630) Weight:  [110.4 kg (243 lb 6.2 oz)] 110.4 kg (243 lb 6.2 oz) (12/18 0500)  Filed Weights   07/29/13 2226 08/03/13 0500 08/04/13 0500  Weight: 101.6 kg (223 lb 15.8 oz) 110.088 kg (242 lb 11.2 oz) 110.4 kg (243 lb 6.2 oz)    Weight change: 0.312 kg (11 oz)   Hemodynamic parameters for last 24 hours: PAP: (27-36)/(16-20) 27/16 mmHg  Intake/Output from previous day: 12/17 0701 - 12/18 0700 In: 686.4 [I.V.:586.4; IV Piggyback:100] Out: 1280 [Urine:1060; Chest Tube:220]  Intake/Output this shift:    Current Meds: Scheduled Meds: . acetaminophen  1,000 mg Oral Q6H   Or  . acetaminophen (TYLENOL) oral liquid 160 mg/5 mL  1,000 mg Per Tube Q6H  . aspirin EC  325 mg Oral Daily   Or  . aspirin  324 mg Per Tube Daily  . bisacodyl  10 mg Oral Daily   Or  . bisacodyl  10 mg Rectal Daily  . clopidogrel  75 mg Oral Q breakfast  . docusate sodium  200 mg Oral Daily  . enoxaparin (LOVENOX) injection  30 mg Subcutaneous Q24H  . insulin aspart  0-24 Units Subcutaneous Q4H  . insulin detemir  24 Units Subcutaneous Daily  . insulin regular  0-10 Units Intravenous TID WC  . metoprolol tartrate  12.5 mg Oral BID     Or  . metoprolol tartrate  12.5 mg Per Tube BID  . pantoprazole  40 mg Oral Daily  . simvastatin  40 mg Oral q1800  . sodium chloride  3 mL Intravenous Q12H   Continuous Infusions: . sodium chloride 20 mL/hr at 08/02/13 1400  . sodium chloride 20 mL/hr at 08/02/13 1400  . sodium chloride    . dexmedetomidine Stopped (08/03/13 0400)  . insulin (NOVOLIN-R) infusion 11 Units/hr (08/02/13 1500)  . lactated ringers 20 mL/hr at 08/02/13 2307  . nitroGLYCERIN Stopped (08/02/13 1400)  . phenylephrine (NEO-SYNEPHRINE) Adult infusion Stopped (08/03/13 0500)   PRN Meds:.metoprolol, midazolam, morphine injection, ondansetron (ZOFRAN) IV, oxyCODONE, sodium chloride  General appearance: alert, cooperative and no distress Neurologic: intact Heart: RRR, no murmur Lungs: diminished at bases;no rales, wheezes, or rhonchi Abdomen: soft, non-tender; bowel sounds normal; no masses,  no organomegaly Extremities: SCDs in place Wound: LE dressing is clean and dry. Sternal dressing intact  Lab Results: CBC:  Recent Labs  08/03/13 1610 08/04/13 0350  WBC 17.3* 15.3*  HGB 12.9* 11.4*  HCT 37.5* 34.1*  PLT 172 157   BMET:   Recent Labs  08/03/13 0405 08/03/13 1609 08/03/13 1610 08/04/13 0350  NA  140 138  --  134*  K 3.6 4.2  --  4.5  CL 107 103  --  102  CO2 25  --   --  25  GLUCOSE 100* 124*  --  132*  BUN 9 13  --  18  CREATININE 0.77 0.90 0.89 1.14  CALCIUM 7.8*  --   --  8.0*    PT/INR:   Recent Labs  08/02/13 1400  LABPROT 16.8*  INR 1.40   Radiology: Dg Chest Portable 1 View In Am  08/03/2013   CLINICAL DATA:  Coronary artery bypass graft  EXAM: PORTABLE CHEST - 1 VIEW  COMPARISON:  08/02/2013  FINDINGS: There is a right-sided Swan-Ganz catheter with the tip at the pulmonary outflow tract. Interval removal of the enteric tube and endotracheal tube. There is a left-sided chest tube with the tip directed towards the apex. There is no pneumothorax. There is bibasilar  airspace disease likely reflecting atelectasis and postsurgical changes. There is a trace left pleural effusion. Stable cardiomediastinal silhouette. Changes from CABG.  There is prior internal fixation of the left glenohumeral joint.  IMPRESSION: 1. Stable support lines and tubing.  No pneumothorax.  2. Bibasilar airspace disease likely reflecting atelectasis.   Electronically Signed   By: Elige Ko   On: 08/03/2013 08:07   Dg Chest Portable 1 View  08/02/2013   CLINICAL DATA:  Postop.  EXAM: PORTABLE CHEST - 1 VIEW  COMPARISON:  07/29/2013  FINDINGS: Sequelae of interval CABG are identified. Endotracheal tube tip projects at the clavicular heads. Right jugular Swan-Ganz catheter tip overlies the main pulmonary artery. Left-sided chest tube is present with tip overlying the left lung apex. Mediastinal drain is in place. Enteric tube courses into the left upper abdomen. Lung volumes are mildly diminished with patchy bibasilar lung opacities now present. No definite pneumothorax is identified. Screws are again partially visualized in the left glenoid and humeral head.  IMPRESSION: 1. Interval CABG with support lines and tubes as above. 2. Mildly diminished lung volumes with bibasilar opacities, likely atelectasis.   Electronically Signed   By: Sebastian Ache   On: 08/02/2013 14:25     Assessment/Plan: S/P Procedure(s) (LRB): CORONARY ARTERY BYPASS GRAFTING (CABG) (N/A) INTRAOPERATIVE TRANSESOPHAGEAL ECHOCARDIOGRAM (N/A) ENDOVEIN HARVEST OF GREATER SAPHENOUS VEIN (Right)  1.CV-S/p NSTEMI.SR. On Lopressor 12.5 bid and Plavix 75 daily. Will increase Lopressor to 25 bid.On ECASA 325 as well will discuss if should decrease to 81 daily. 2.Pulmonary-Chest tubes with 220 cc last 24 hours. CXR this am shows no pneumothorax, vascular congestion, bilateral pleural effusions, bibasilar atelectasis, and cardiomegaly. May remove chest tube later this am.Encourage incentive spirometer and flutter valve. 3. Volume  overload-lasix 40 IV once today 4. ABL anemia-H and H 11.4 and 34.1 this am 5.DM-CBGs 102/118/133. Pre op HGA1C 12.3. Will start Metformin when tolerating po. Continue Levemir for now.Will require follow up as an outpatient. 6. Remove foley 7.Likely transfer to [facility]U   Ardelle Balls PA-C 08/04/2013 7:31 AM I have seen and examined [name] and agree with the above assessment  and plan.  Delight Ovens MD [facility] [phone] Office [phone] 08/04/2013 8:13 AM

## 2013-08-04 NOTE — Op Note (Signed)
NAME:  BIRCH, FARINO NO.:  [id number]  MEDICAL RECORD NO.:  [id number]  LOCATION:  2S05C                        FACILITY:  [facility]  PHYSICIAN:  Sheliah Plane, MD    DATE OF BIRTH:  1955-11-26  DATE OF PROCEDURE:  08/02/2013 DATE OF DISCHARGE:                              OPERATIVE REPORT   PREOPERATIVE DIAGNOSES:  Acute non-ST segment elevation myocardial infarction less than 72 hours, and 3-vessel coronary artery disease.  POSTOPERATIVE DIAGNOSES:  Acute non-ST elevation myocardial infarction less than 72 hours, and 3-vessel coronary artery disease.  SURGICAL PROCEDURE:  Coronary artery bypass grafting x5 with the left internal mammary to the left anterior descending coronary artery, reverse saphenous vein graft to the intermediate coronary artery, sequential reverse saphenous vein graft to the obtuse marginal and distal circumflex, reverse saphenous vein graft to the posterior descending, with right leg endo vein harvesting and TEE.  SURGEON:  Sheliah Plane, MD  FIRST ASSISTANT:  Pauline Good PA  BRIEF HISTORY:  The patient is a 57 year old male, admitted with non-STE myocardial infarction.  Four days prior to surgery, he was stabilized medically on admission, watched over the weekend, and on Monday underwent cardiac catheterization.  This revealed severe three-vessel coronary artery disease with 90% proximal circumflex, 70% obtuse marginal, chronically occluded total right coronary artery proximal with some collateral flow, depressed ejection fraction of 40% cardiac cath and echo.  Because of the patient's critical 3-vessel disease and symptoms, urgent coronary artery bypass grafting was recommended.  The patient agreed and signed informed consent.  At the time of diagnosis, he was found to have hemoglobin A1c of 12, and preoperatively had persistently elevated glucose.  Prior to admission, the patient was unaware of his diagnosis of  diabetes.  DESCRIPTION OF PROCEDURE:  With Swan-Ganz and arterial line monitors placed in the holding room, following this the patient became nauseated and was taken to the operating room, intubated, and PA pressures were noted to be markedly elevated with diffuse ST elevations.  With intubation and increasing the nitroglycerin, these changes stabilized. He did have moderate mitral regurgitation at that time.  This gradually disappeared as STs normalized.  The skin of the chest and legs were prepped with Betadine and draped in usual sterile manner.  Appropriate timeout was performed and we proceeded with endo vein harvesting with the [employer] endo vein harvesting system from the right thigh and calf. Median sternotomy was performed.  Left internal mammary artery was dissected down as a pedicle graft.  Distal artery was divided and had good free flow.  Pericardium was opened.  The patient had been systemically heparinized.  The ascending aorta was cannulated.  The right atrium was cannulated and aortic root vent.  Cardioplegia needle was introduced into the ascending aorta.  The aortic root vent, cardioplegia needle was placed.  The patient was placed on cardiopulmonary bypass 2.4 L/min/meter squared.  Sites of anastomosis were expected.  The right coronary artery was very small and diffusely calcified.  The posterior descending coronary artery was small.  The LAD was a 1.2 mm vessel with diffuse disease.  The intermediate was diffusely diseased, 1.2 mm in size.  The first obtuse marginal was  a larger vessel, 1.5-1.6 mm in size.  The distal circumflex was slightly smaller, but admitted a 1.5 mm probe.  The aortic crossclamp was applied, 500 mL cold blood potassium cardioplegia was administered with diastolic arrest of the heart.  Myocardial septal temperature was monitored throughout the crossclamp.  The sites for anastomosis were selected and dissected out of the epicardium.  The aortic  crossclamp was applied, 500 mL of cold blood potassium cardioplegia was administered with diastolic arrest of the heart.  Myocardial septal temperature was monitored throughout the crossclamp.  Attention was turned first to the OM-1 vessel, which was opened and admitted a 1.5 mm probe.  Using a diamond-type side-to-side anastomosis was carried out with a second reverse saphenous vein graft.  Distal extent of the same vein was then carried short distance to the distal circumflex vessel, which was opened and admitted a 1.5 mm probe.  Using a running 7-0 Prolene, distal anastomosis was performed.  Additional cold blood cardioplegia was administered down the vein grafts.  Attention was then turned to the intermediate coronary artery, which was a smaller vessel, diffusely diseased, but did admit a 1 mm probe distally.  Using a running 7-0 Prolene, distal anastomosis was performed with a segment of reverse saphenous vein graft.  Attention was then turned to the posterior descending coronary artery.  This vessel was also very diffusely diseased and calcified.  The vessel was opened, admitted a 1 mm probe distally.  Using a running 7-0 Prolene, distal anastomosis was performed.  Additional cold blood cardioplegia was administered down the vein grafts.  Attention was then turned to the left anterior descending coronary artery.  This vessel was small, very diffusely diseased throughout its course.  The vessel was opened and admitted a 1 mm probe distally and proximally.  Using a running 8-0 Prolene, left internal mammary artery was anastomosed to the left anterior descending coronary artery.  With crossclamp still in place, 3 punch aortotomies were performed in the ascending aorta.  Each of the 3 vein grafts were anastomosed to the ascending aorta.  Air was evacuated from the heart and the ascending aorta.  Bulldog was removed from the mammary artery with rise in myocardial septal temperature.   Aortic crossclamp was removed with total crossclamp time of 112 minutes.  The patient required electric defibrillation return to a sinus rhythm.  Sites of anastomosis were inspected and free of bleeding.  He was started on a milrinone infusion and low dose dopamine.  He was then ventilated and weaned from cardiopulmonary bypass without difficulty.  TEE showed some improvement in LV function, but still depressed globally.  The mitral regurgitation had almost completely disappeared.  His PA pressures were markedly reduced.  He remained hemodynamically stable.  He was decannulated in usual fashion.  Protamine sulfate was administered.  He was decannulated.  Atrial and ventricular pacing wires had been applied. Graft markers were applied.  The left pleural tube and Blake mediastinal drain were left in place.  Sternum was closed with #6 stainless steel wire.  Fascia was closed with interrupted 0 Vicryl, running 3-0 Vicryl in subcutaneous tissue, 4-0 subcuticular stitch in skin edges.  Dry dressings were applied.  Sponge and needle count was reported as correct at the completion of the procedure.  The patient tolerated the procedure without obvious complication and was transferred to Surgical Intensive Care Unit for further postoperative care.  Total pump time was 146 minutes.  The patient did not require any blood bank blood products during  the operative procedure.     Sheliah Plane, MD     EG/MEDQ  D:  08/03/2013  T:  08/04/2013  Job:  [id number]

## 2013-08-05 LAB — CBC
HCT: 34.1 % — ABNORMAL LOW (ref 39.0–52.0)
Hemoglobin: 11.6 g/dL — ABNORMAL LOW (ref 13.0–17.0)
MCH: 31.4 pg (ref 26.0–34.0)
MCHC: 34 g/dL (ref 30.0–36.0)
MCV: 92.2 fL (ref 78.0–100.0)
Platelets: 179 10*3/uL (ref 150–400)
RBC: 3.7 MIL/uL — ABNORMAL LOW (ref 4.22–5.81)
RDW: 12.5 % (ref 11.5–15.5)
WBC: 16 10*3/uL — ABNORMAL HIGH (ref 4.0–10.5)

## 2013-08-05 LAB — BASIC METABOLIC PANEL
BUN: 28 mg/dL — ABNORMAL HIGH (ref 6–23)
CO2: 23 mEq/L (ref 19–32)
Calcium: 8.2 mg/dL — ABNORMAL LOW (ref 8.4–10.5)
Chloride: 102 mEq/L (ref 96–112)
Creatinine, Ser: 0.9 mg/dL (ref 0.50–1.35)
GFR calc Af Amer: >90 mL/min (ref >90)
GFR calc non Af Amer: >90 mL/min (ref >90)
Glucose, Bld: 132 mg/dL — ABNORMAL HIGH (ref 70–99)
Potassium: 4.6 mEq/L (ref 3.5–5.1)
Sodium: 134 mEq/L — ABNORMAL LOW (ref 135–145)

## 2013-08-05 LAB — GLUCOSE, CAPILLARY
Glucose-Capillary: 142 mg/dL — ABNORMAL HIGH (ref 70–99)
Glucose-Capillary: 162 mg/dL — ABNORMAL HIGH (ref 70–99)
Glucose-Capillary: 173 mg/dL — ABNORMAL HIGH (ref 70–99)
Glucose-Capillary: 217 mg/dL — ABNORMAL HIGH (ref 70–99)
Glucose-Capillary: 233 mg/dL — ABNORMAL HIGH (ref 70–99)

## 2013-08-05 MED ORDER — ATORVASTATIN CALCIUM 20 MG PO TABS
20.0000 mg | ORAL_TABLET | Freq: Every day | ORAL | Status: DC
  Administered 2013-08-05 – 2013-08-07 (×3): 20 mg via ORAL
  Filled 2013-08-05 (×4): qty 1

## 2013-08-05 MED ORDER — AMIODARONE HCL 200 MG PO TABS
400.0000 mg | ORAL_TABLET | Freq: Two times a day (BID) | ORAL | Status: DC
  Administered 2013-08-05 (×2): 400 mg via ORAL
  Filled 2013-08-05 (×4): qty 2

## 2013-08-05 MED ORDER — METOPROLOL TARTRATE 1 MG/ML IV SOLN
INTRAVENOUS | Status: AC
  Administered 2013-08-05: 5 mg
  Filled 2013-08-05: qty 5

## 2013-08-05 MED ORDER — FUROSEMIDE 10 MG/ML IJ SOLN
40.0000 mg | Freq: Once | INTRAMUSCULAR | Status: AC
  Administered 2013-08-05: 40 mg via INTRAVENOUS
  Filled 2013-08-05: qty 4

## 2013-08-05 MED ORDER — METOPROLOL TARTRATE 1 MG/ML IV SOLN: 5.0000 mg | Freq: Once | INTRAVENOUS | Status: AC

## 2013-08-05 MED ORDER — BD GETTING STARTED TAKE HOME KIT: 3/10ML X 30G SYRINGES
1.0000 | Freq: Once | Status: DC
  Filled 2013-08-05: qty 1

## 2013-08-05 MED ORDER — LACTULOSE 10 GM/15ML PO SOLN
20.0000 g | Freq: Every day | ORAL | Status: DC | PRN
  Filled 2013-08-05: qty 30

## 2013-08-05 NOTE — Progress Notes (Signed)
Pt with new onset Afib this morning HR 120-140's, asymptomatic.  PA notified, orders received and carried out.  Will con't plan of care.

## 2013-08-05 NOTE — Discharge Summary (Addendum)
Physician Discharge Summary  Patient ID: Alex Yang MRN: [id number] DOB/AGE: Jan 05, 1956 57 y.o.  Admit date: 07/29/2013 Discharge date: 08/05/2013  Admission Diagnoses:  Patient Active Problem List   Diagnosis Date Noted  . Type II or unspecified type diabetes mellitus with unspecified complication, uncontrolled 08/01/2013  . NSTEMI (non-ST elevated myocardial infarction) 07/29/2013   Discharge Diagnoses:   Patient Active Problem List   Diagnosis Date Noted  . DM (diabetes mellitus) 08/05/2013  . S/P CABG x 5 08/02/2013  . Type II or unspecified type diabetes mellitus with unspecified complication, uncontrolled 08/01/2013  . NSTEMI (non-ST elevated myocardial infarction) 07/29/2013   Discharged Condition: good  History of Present Illness:   Mr. Alex Yang is a 57 yo white male who presented to the Emergency Department on 07/29/2013 with complaints of chest pain.  The patient described the pain as a tightness that radiated to his axilla bilaterally.  The patient states this has been occurring intermittently for the past 2 weeks.  However, today the pain was sudden in onset when patient was watching television and had lasted for 5 minutes.  He denied nausea, vomiting, but did admit to some "clamminess" during the episode. The patient denied any other medical problems.  Workup in the ED revealed elevated Troponin and no ST changes on the EKG.  He was ruled in for a NSTEMI and admitted for further workup.    Hospital Course:   He was admitted by the Cardiology service and placed on Heparin and Nitroglycerin drip.  The patients lab results revealed an elevated Hemoglobin A1c of 12.3.  The patient had no knowledge of being a diabetic.  He remained chest pain free during his admission.  He was taken for cardiac catheterization on 08/01/2013, which revealed severe 3 vessel CAD with total occlusion of the RCA.  There was also evidence of reduced ejection fraction.  It was felt being the patient  was a diabetic he would benefit from Coronary Bypass procedure and T[facility] was consulted.  The patient was evaluated by Dr. Tyrone Yang who after review of the catheterization films was in agreement the patient would benefit from CABG.  The risks and benefits of the procedure were explained to the patient and he was agreeable to proceed.  He was taken to the operating room on 08/02/2013.  He underwent CABG x 5 utilizing LIMA to LAD, SVG to Ramus Intermediate, a sequential SVG to Obtuse Marginal and Distal Left Circumflex and a SVG to PDA.  He also underwent Endoscopic Saphenous Vein Harvest of his Right Leg.  He tolerated the procedure without difficulty and was taken to the SICU in stable condition.  The patient was extubated the evening of surgery.  During his stay in the ICU the patient was weaned off Milrinone and Dopamine drips as tolerated.  He was weaned off insulin drip as tolerated.  His chest tubes and arterial lines were removed without difficulty.  He was ambulating around the ICU.  He was maintaining NSR and was transferred to the telemetry unit in stable condition.  The patient developed a brief episode of Rapid Atrial Fibrillation with RVR.  He was treated with IV Lopressor and started on an oral regimen of Amiodarone.  He was successfully converted to NSR.  The patients blood sugars have been well controlled during hospitalization.  He is a newly diagnosed diabetic and will be discharged on Metformin therapy.  Diabetes education consult was obtained and education was provided to the patient.  He continues to do well.  He is maintaining NSR and his pacing wires have been removed without difficulty.  He is ambulating with minimal assist.  He is medically stable at this time.  Should no further issues arise we anticipate discharge home in the next 24-48 hours.  He will follow up with Dr. Tyrone Yang on 09/01/2013 with a CXR prior to his appointment.  He will need to follow up with Primary care physician to  achieve good diabetes management.  Finally he will follow up with Cardiology in 2- 4 weeks.       Significant Diagnostic Studies: angiography:   Hemodynamics:   AO 128/82  LV 123/31   Coronary angiography:  Coronary dominance: right  Left mainstem: Patent with no obstructive disease  Left anterior descending (LAD): Diffusely diseased vessel with tandem 75% lesions in the proximal and mid-vessel. The entire vessel has diffuse irregularity and nonobstructive disease into the apical portion.  Left circumflex (LCx): there is a severely diseased intermediate branch with 95% stenosis. The AV circumflex has a 90% hypodense lesion in the proximal portion of the vessel. There are 2 OM branches with diffuse nonobstructive disease.  Right coronary artery (RCA): Total occlusion proximally. Fills from right to right and left to right collaterals.  Left ventriculography: The basal and mid-inferior walls are akinetic. The LVEF is estimated at 40%.   Treatments: surgery:  Coronary artery bypass grafting x5 with the left internal mammary to the left anterior descending coronary artery,  reverse saphenous vein graft to the intermediate coronary artery, sequential reverse saphenous vein graft to the obtuse marginal and distal circumflex, reverse saphenous vein graft to the posterior descending, with right leg endo vein harvesting and TEE.  Disposition: Home  Discharge Medications:     Medication List    STOP taking these medications       aspirin 325 MG tablet  Replaced by:  aspirin 81 MG EC tablet     nitroGLYCERIN 0.4 MG SL tablet  Commonly known as:  NITROSTAT      TAKE these medications       Alcohol Swabs Pads  Please clean finger prior to checking blood sugar     amiodarone 200 MG tablet  Commonly known as:  PACERONE  Take 2 tablets (400 mg total) by mouth 2 (two) times daily. For 7 days, then decrease to 200 mg twice a day for 7 days, then decrease to 200 mg daily     aspirin 81 MG  EC tablet  Take 1 tablet (81 mg total) by mouth daily.     atorvastatin 20 MG tablet  Commonly known as:  LIPITOR  Take 1 tablet (20 mg total) by mouth daily at 6 PM.     clopidogrel 75 MG tablet  Commonly known as:  PLAVIX  Take 1 tablet (75 mg total) by mouth daily with breakfast.     furosemide 40 MG tablet  Commonly known as:  LASIX  Take 1 tablet (40 mg total) by mouth daily. For 7 Days     glucose blood test strip  Use as instructed     glucose monitoring kit monitoring kit  1 each by Does not apply route as needed for other. Please choose glucometer of your choic     L-ARGININE PO  Take 1 tablet by mouth daily.     LANCETS ULTRA THIN 30G Misc  1 Units by Does not apply route 4 (four) times daily. Breakfast, Lunch, Dinner, Bedtime     lisinopril 5 MG tablet  Commonly  known as:  PRINIVIL,ZESTRIL  Take 1 tablet (5 mg total) by mouth daily.     metFORMIN 850 MG tablet  Commonly known as:  GLUCOPHAGE  Take 1 tablet (850 mg total) by mouth 2 (two) times daily with a meal.     metoprolol tartrate 25 MG tablet  Commonly known as:  LOPRESSOR  Take 1 tablet (25 mg total) by mouth 2 (two) times daily.     oxyCODONE 5 MG immediate release tablet  Commonly known as:  Oxy IR/ROXICODONE  Take 1-2 tablets (5-10 mg total) by mouth every 3 (three) hours as needed for moderate pain.     potassium chloride SA 20 MEQ tablet  Commonly known as:  K-DUR,KLOR-CON  Take 1 tablet (20 mEq total) by mouth daily. For 7 Days        The patient has been discharged on:   1.Beta Blocker:  Yes [ x  ]                              No   [   ]                              If No, reason:  2.Ace Inhibitor/ARB: Yes [x   ]                                     No  [    ]                                     If No, reason:   3.Statin:   Yes [ x  ]                  No  [   ]                  If No, reason:  4.Ecasa:  Yes  [ x  ]                  No   [   ]                  If No,  reason:          Future Appointments Provider Department Dept Phone   09/01/2013 9:30 AM Delight Ovens, MD [facility] [phone]       Medication List    ASK your doctor about these medications       aspirin 325 MG tablet  Take 650 mg by mouth once as needed for mild pain, moderate pain or headache.     L-ARGININE PO  Take 1 tablet by mouth daily.     nitroGLYCERIN 0.4 MG SL tablet  Commonly known as:  NITROSTAT  Place 0.4 mg under the tongue every 5 (five) minutes as needed for chest pain.       Follow-up Information   Follow up with Delight Ovens, MD On 09/01/2013. (Appointment is at 9:30)    Specialty:  Cardiothoracic Surgery   Contact information:   [address] [place] [address] [place] [address] [phone]       Follow up with [facility] On 09/01/2013. (  Please get CXR at 8:30)    Contact information:   [place]       Signed: BARRETT, ERIN 08/05/2013, 12:39 PM

## 2013-08-05 NOTE — Progress Notes (Signed)
Pt declines to walk at this time.  States he is "sore and wore out."  PRN pain med given, encouraged to use IS.  Will con't plan of care.
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
## 2013-08-05 NOTE — Progress Notes (Signed)
CARDIAC REHAB PHASE I   PRE:  Rate/Rhythm: 98SR  BP:  Supine:   Sitting: 118/72  Standing:    SaO2: 99%RA  MODE:  Ambulation: 550 ft   POST:  Rate/Rhythm: 112 afib   BP:  Supine:   Sitting: 110/78  Standing:    SaO2: 100%RA [id number] Pt walked 550 ft on RA with rolling walker and minimal asst. Tolerated well except went into atrial fib after walk. Sats good on RA. Back to bed after walk. Encouraged walks with staff. Notified RN of atrial fib.   Luetta Nutting, RN BSN  08/05/2013 9:39 AM

## 2013-08-05 NOTE — Progress Notes (Signed)
Inpatient Diabetes Program Recommendations  AACE/ADA: New Consensus Statement on Inpatient Glycemic Control (2013)  Target Ranges:  Prepandial:   less than 140 mg/dL      Peak postprandial:   less than 180 mg/dL (1-2 hours)      Critically ill patients:  140 - 180 mg/dL   Reason for Visit: Results for KENTRAIL, SHEW (MRN [id number]) as of 08/05/2013 16:03  Ref. Range 08/04/2013 20:40 08/05/2013 00:14 08/05/2013 04:35 08/05/2013 04:50 08/05/2013 11:59  Glucose-Capillary Latest Range: 70-99 mg/dL 782 (H) 956 (H) 213 (H)  162 (H)   Spoke to patient regarding elevated A1C.  Note that CBG's look better since surgery. Unclear whether patient will need insulin at discharge based on improvement of CBG's.  Will order insulin starter kit for RN to do bedside education just in case.  Patient states he has watched the videos and gotten the book.  He does not have PCP.  If insulin is ordered at discharge will need generic insulin.  May consider NPH 15 units bid.   Also consider starting Metformin at discharge.  Thanks, Beryl Meager, RN, BC-ADM Inpatient Diabetes Coordinator Pager [phone]

## 2013-08-06 LAB — GLUCOSE, CAPILLARY
Glucose-Capillary: 103 mg/dL — ABNORMAL HIGH (ref 70–99)
Glucose-Capillary: 116 mg/dL — ABNORMAL HIGH (ref 70–99)
Glucose-Capillary: 134 mg/dL — ABNORMAL HIGH (ref 70–99)
Glucose-Capillary: 151 mg/dL — ABNORMAL HIGH (ref 70–99)
Glucose-Capillary: 172 mg/dL — ABNORMAL HIGH (ref 70–99)
Glucose-Capillary: 202 mg/dL — ABNORMAL HIGH (ref 70–99)
Glucose-Capillary: 308 mg/dL — ABNORMAL HIGH (ref 70–99)

## 2013-08-06 MED ORDER — FUROSEMIDE 10 MG/ML IJ SOLN
40.0000 mg | Freq: Once | INTRAMUSCULAR | Status: AC
  Administered 2013-08-06: 40 mg via INTRAVENOUS
  Filled 2013-08-06: qty 4

## 2013-08-06 MED ORDER — INSULIN DETEMIR 100 UNIT/ML ~~LOC~~ SOLN
10.0000 [IU] | Freq: Every day | SUBCUTANEOUS | Status: DC
  Administered 2013-08-06: 10 [IU] via SUBCUTANEOUS
  Filled 2013-08-06 (×2): qty 0.1

## 2013-08-06 MED ORDER — AMIODARONE HCL 200 MG PO TABS
400.0000 mg | ORAL_TABLET | Freq: Three times a day (TID) | ORAL | Status: DC
  Administered 2013-08-06 – 2013-08-08 (×7): 400 mg via ORAL
  Filled 2013-08-06 (×9): qty 2

## 2013-08-06 MED ORDER — METFORMIN HCL 500 MG PO TABS
500.0000 mg | ORAL_TABLET | Freq: Two times a day (BID) | ORAL | Status: DC
  Administered 2013-08-06 – 2013-08-07 (×2): 500 mg via ORAL
  Filled 2013-08-06 (×4): qty 1

## 2013-08-06 NOTE — Progress Notes (Addendum)
Ambulated in hall with patient. Patient ambulated without any assistive device. Ambulated approx. 500 feet. Walk was tolerated well. This completes patient's second walk for today. Alex Yang

## 2013-08-06 NOTE — Progress Notes (Signed)
1520-1600 Cardiac Rehab Pt just had completed a walk, denies any problems. Completed discharge education with pt and significant other. Pt voices understanding. He agrees to [employer]. CRP in [place]. Beatrix Fetters, RN 08/06/2013 4:25 PM

## 2013-08-06 NOTE — Progress Notes (Signed)
Pt given insulin started kit.  RN and pt went through kit and booklet, and answered in pt questions.  Pt was very receptive and ready to learn information.  Call light in reach, RN will continue to monitor.   Thane Edu, RN

## 2013-08-06 NOTE — Progress Notes (Addendum)
      [address]       [name] [address]             [phone]      4 Days Post-Op Procedure(s) (LRB): CORONARY ARTERY BYPASS GRAFTING (CABG) (N/A) INTRAOPERATIVE TRANSESOPHAGEAL ECHOCARDIOGRAM (N/A) ENDOVEIN HARVEST OF GREATER SAPHENOUS VEIN (Right)  Subjective:  Alex Yang has no new complaints this morning.  He continued to have brief episodes of Atrial Fibrillation yesterday and overnight.  Patient again states these happened when he was tangled up in bed.   Objective: Vital signs in last 24 hours: Temp:  [98.2 F (36.8 C)-98.4 F (36.9 C)] 98.4 F (36.9 C) (12/20 0404) Pulse Rate:  [86-98] 86 (12/20 0404) Cardiac Rhythm:  [-] Sinus tachycardia;Atrial fibrillation (12/19 2100) Resp:  [19-28] 19 (12/20 0404) BP: (122-133)/(79-85) 126/82 mmHg (12/20 0404) SpO2:  [96 %-98 %] 97 % (12/20 0404) Weight:  [241 lb 4.8 oz (109.453 kg)] 241 lb 4.8 oz (109.453 kg) (12/20 0404)  Intake/Output from previous day: 12/19 0701 - 12/20 0700 In: 480 [P.O.:480] Out: 1 [Stool:1]  General appearance: alert, cooperative and no distress Heart: regular rate and rhythm Lungs: clear to auscultation bilaterally Abdomen: soft, non-tender; bowel sounds normal; no masses,  no organomegaly Extremities: edema trace Wound: clean and dry  Lab Results:  Recent Labs  08/04/13 0350 08/05/13 0450  WBC 15.3* 16.0*  HGB 11.4* 11.6*  HCT 34.1* 34.1*  PLT 157 179   BMET:  Recent Labs  08/04/13 0350 08/05/13 0450  NA 134* 134*  K 4.5 4.6  CL 102 102  CO2 25 23  GLUCOSE 132* 132*  BUN 18 28*  CREATININE 1.14 0.90  CALCIUM 8.0* 8.2*    PT/INR: No results found for this basename: LABPROT, INR,  in the last 72 hours ABG    Component Value Date/Time   PHART 7.365 08/02/2013 2119   HCO3 23.1 08/02/2013 2119   TCO2 23 08/03/2013 1609   ACIDBASEDEF 2.0 08/02/2013 2119   O2SAT 94.0 08/02/2013 2119   CBG (last 3)   Recent Labs  08/05/13 2118 08/06/13 0032  08/06/13 0553  GLUCAP 217* 116* 172*    Assessment/Plan: S/P Procedure(s) (LRB): CORONARY ARTERY BYPASS GRAFTING (CABG) (N/A) INTRAOPERATIVE TRANSESOPHAGEAL ECHOCARDIOGRAM (N/A) ENDOVEIN HARVEST OF GREATER SAPHENOUS VEIN (Right)  1. CV- Episodes of A. Fib, currently NSR- continue Amiodarone, Lopressor 2. Pulm- no acute issues, continue IS 3. Renal- remains volume overloaded, lost 2 lbs yesterday with IV lasix, will repeat today 4. DM- preop A1c 12.3, new diagnoses, will start Metformin at 500 mg BID, decrease Levemir to 10 U daily- ideally would like to avoid insulin at discharge being patient has never taken any medications for diabetes, therefore oral agents are a reasonable starting point 6. Dispo- patient stable, continued to have episodes of A. Fib yesterday, will discuss with staff possibly increasing Beta blocker, however if this continues may ultimately need anticoagulation   LOS: 8 days    Lowella Dandy 08/06/2013   Chart reviewed, patient examined, agree with above. He really looks very good. Some brief A-fib. Will increase Amio to 400 tid to load it. Weight is still 17 lbs over preop if accurate. Agree with continued diuresis.

## 2013-08-07 DIAGNOSIS — Z951 Presence of aortocoronary bypass graft: Secondary | ICD-10-CM

## 2013-08-07 LAB — GLUCOSE, CAPILLARY
Glucose-Capillary: 136 mg/dL — ABNORMAL HIGH (ref 70–99)
Glucose-Capillary: 154 mg/dL — ABNORMAL HIGH (ref 70–99)
Glucose-Capillary: 168 mg/dL — ABNORMAL HIGH (ref 70–99)
Glucose-Capillary: 183 mg/dL — ABNORMAL HIGH (ref 70–99)
Glucose-Capillary: 192 mg/dL — ABNORMAL HIGH (ref 70–99)
Glucose-Capillary: 198 mg/dL — ABNORMAL HIGH (ref 70–99)

## 2013-08-07 MED ORDER — LISINOPRIL 5 MG PO TABS
5.0000 mg | ORAL_TABLET | Freq: Every day | ORAL | Status: DC
  Administered 2013-08-07 – 2013-08-08 (×2): 5 mg via ORAL
  Filled 2013-08-07 (×3): qty 1

## 2013-08-07 MED ORDER — METFORMIN HCL 850 MG PO TABS
850.0000 mg | ORAL_TABLET | Freq: Two times a day (BID) | ORAL | Status: DC
  Administered 2013-08-07 – 2013-08-08 (×2): 850 mg via ORAL
  Filled 2013-08-07 (×4): qty 1

## 2013-08-07 MED ORDER — DIPHENHYDRAMINE HCL 25 MG PO CAPS
25.0000 mg | ORAL_CAPSULE | Freq: Four times a day (QID) | ORAL | Status: DC | PRN
  Administered 2013-08-07: 25 mg via ORAL
  Filled 2013-08-07: qty 1

## 2013-08-07 MED ORDER — FUROSEMIDE 40 MG PO TABS
40.0000 mg | ORAL_TABLET | Freq: Every day | ORAL | Status: DC
  Administered 2013-08-07 – 2013-08-08 (×2): 40 mg via ORAL
  Filled 2013-08-07 (×2): qty 1
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
## 2013-08-07 NOTE — Progress Notes (Signed)
Removed EPW per PA order. VS stable post EPW removal. No bleeding noted, ends intact. Patient in NSR. Bed rest per protocol. Will continue to monitor closely. Stanton Kidney R

## 2013-08-07 NOTE — Progress Notes (Signed)
Ambulated with patient this a.m. Patient ambulated independently on RA. Tolerated walk well. Ambulated approx. 500 feet. .me

## 2013-08-08 LAB — GLUCOSE, CAPILLARY
Glucose-Capillary: 139 mg/dL — ABNORMAL HIGH (ref 70–99)
Glucose-Capillary: 145 mg/dL — ABNORMAL HIGH (ref 70–99)
Glucose-Capillary: 225 mg/dL — ABNORMAL HIGH (ref 70–99)
Glucose-Capillary: 111 mg/dL — ABNORMAL HIGH (ref 70–99)

## 2013-08-08 MED ORDER — ASPIRIN 81 MG PO TBEC: 81.0000 mg | DELAYED_RELEASE_TABLET | Freq: Every day | ORAL | Status: DC

## 2013-08-08 MED ORDER — LISINOPRIL 5 MG PO TABS: 5.0000 mg | ORAL_TABLET | Freq: Every day | ORAL | Status: DC

## 2013-08-08 MED ORDER — GLUCOSE BLOOD VI STRP: ORAL_STRIP | Status: DC

## 2013-08-08 MED ORDER — ATORVASTATIN CALCIUM 20 MG PO TABS: 20.0000 mg | ORAL_TABLET | Freq: Every day | ORAL | Status: DC

## 2013-08-08 MED ORDER — FUROSEMIDE 40 MG PO TABS: 40.0000 mg | ORAL_TABLET | Freq: Every day | ORAL | Status: DC

## 2013-08-08 MED ORDER — FREESTYLE SYSTEM KIT: 1.0000 | PACK | Status: DC | PRN

## 2013-08-08 MED ORDER — LANCETS ULTRA THIN 30G MISC: 1.0000 [IU] | Freq: Four times a day (QID) | Status: DC

## 2013-08-08 MED ORDER — CLOPIDOGREL BISULFATE 75 MG PO TABS: 75.0000 mg | ORAL_TABLET | Freq: Every day | ORAL | Status: DC

## 2013-08-08 MED ORDER — OXYCODONE HCL 5 MG PO TABS: 5.0000 mg | ORAL_TABLET | ORAL | Status: DC | PRN

## 2013-08-08 MED ORDER — ALCOHOL SWABS PADS: MEDICATED_PAD | Status: DC

## 2013-08-08 MED ORDER — POTASSIUM CHLORIDE CRYS ER 20 MEQ PO TBCR: 20.0000 meq | EXTENDED_RELEASE_TABLET | Freq: Every day | ORAL | Status: DC

## 2013-08-08 MED ORDER — AMIODARONE HCL 200 MG PO TABS: 400.0000 mg | ORAL_TABLET | Freq: Two times a day (BID) | ORAL | Status: DC

## 2013-08-08 MED ORDER — METFORMIN HCL 850 MG PO TABS: 850.0000 mg | ORAL_TABLET | Freq: Two times a day (BID) | ORAL | Status: DC

## 2013-08-08 MED ORDER — METOPROLOL TARTRATE 25 MG PO TABS: 25.0000 mg | ORAL_TABLET | Freq: Two times a day (BID) | ORAL | Status: DC

## 2013-08-08 NOTE — Progress Notes (Signed)
Went over discharge instructions with the patient. IV d/c'd and patient taken off the cardiac monitor. Patient stable and discharged home with girlfriend. Stanton Kidney R

## 2013-08-08 NOTE — Progress Notes (Signed)
Pt reminded several times tonight to save urine in the urinal that we are keeping track of his urine output. Pt states he has voided in the toilet through the night that he could not get the hang of using the urinal. He states he has voided several times a moderate amount of urine. Pt reminded again that we are keeping track of his urine and output and asked him to save in the urinal.Joyanna Kleman, Janae Sauce

## 2013-08-08 NOTE — Progress Notes (Addendum)
      [address]       [name] [address]             [phone]      6 Days Post-Op Procedure(s) (LRB): CORONARY ARTERY BYPASS GRAFTING (CABG) (N/A) INTRAOPERATIVE TRANSESOPHAGEAL ECHOCARDIOGRAM (N/A) ENDOVEIN HARVEST OF GREATER SAPHENOUS VEIN (Right)  Subjective:  Mr. Alex Yang has no complaints.  He is ready to go home.  Objective: Vital signs in last 24 hours: Temp:  [97.6 F (36.4 C)-98.5 F (36.9 C)] 97.6 F (36.4 C) (12/22 0427) Pulse Rate:  [79-89] 80 (12/22 0427) Cardiac Rhythm:  [-] Normal sinus rhythm (12/21 2000) Resp:  [17-19] 17 (12/22 0427) BP: (112-136)/(65-85) 117/74 mmHg (12/22 0427) SpO2:  [96 %-100 %] 96 % (12/22 0427) Weight:  [239 lb (108.41 kg)] 239 lb (108.41 kg) (12/22 0427)  Intake/Output from previous day: 12/21 0701 - 12/22 0700 In: 480 [P.O.:480] Out: -   General appearance: alert, cooperative and no distress Neurologic: intact Heart: regular rate and rhythm Lungs: clear to auscultation bilaterally Abdomen: soft, non-tender; bowel sounds normal; no masses,  no organomegaly Extremities: edema improved, RLE ecchymotic Wound: clean and dry  Lab Results: No results found for this basename: WBC, HGB, HCT, PLT,  in the last 72 hours BMET: No results found for this basename: NA, K, CL, CO2, GLUCOSE, BUN, CREATININE, CALCIUM,  in the last 72 hours  PT/INR: No results found for this basename: LABPROT, INR,  in the last 72 hours ABG    Component Value Date/Time   PHART 7.365 08/02/2013 2119   HCO3 23.1 08/02/2013 2119   TCO2 23 08/03/2013 1609   ACIDBASEDEF 2.0 08/02/2013 2119   O2SAT 94.0 08/02/2013 2119   CBG (last 3)   Recent Labs  08/07/13 2354 08/08/13 0424 08/08/13 0547  GLUCAP 168* 139* 145*    Assessment/Plan: S/P Procedure(s) (LRB): CORONARY ARTERY BYPASS GRAFTING (CABG) (N/A) INTRAOPERATIVE TRANSESOPHAGEAL ECHOCARDIOGRAM (N/A) ENDOVEIN HARVEST OF GREATER SAPHENOUS VEIN (Right)  1. CV- NSR- continue  Amiodarone, Lopressor, and ACE 2. Pulm- no acute issues, continue IS 3. Renal- weight remains above baseline, however is trending down, will continue Lasix at discharge 4. DM- CBGs controlled, will d/c on Metformin patient instructed to keep track of sugars and follow up with PCP 5. Dispo- patient stable, will d/c home today   LOS: 10 days    Lowella Dandy 08/08/2013  Plan d/c today Patient plans to go to [facility]  I have seen and examined Alex Yang and agree with the above assessment  and plan.  Delight Ovens MD Beeper [phone] Office [phone] 08/08/2013 11:56 AM

## 2013-08-08 NOTE — Progress Notes (Signed)
Cardiac Rehab [phone] Reviewed discharge education with pt. He voices understanding. Beatrix Fetters, RN 08/08/2013 8:50 AM

## 2013-08-15 ENCOUNTER — Encounter: Payer: Self-pay | Admitting: Cardiothoracic Surgery

## 2013-08-15 DIAGNOSIS — I779 Disorder of arteries and arterioles, unspecified: Secondary | ICD-10-CM

## 2013-08-15 DIAGNOSIS — I679 Cerebrovascular disease, unspecified: Secondary | ICD-10-CM | POA: Insufficient documentation

## 2013-08-15 HISTORY — DX: Disorder of arteries and arterioles, unspecified: I77.9

## 2013-08-24 ENCOUNTER — Inpatient Hospital Stay: Payer: Self-pay

## 2013-08-24 ENCOUNTER — Other Ambulatory Visit: Payer: Self-pay | Admitting: *Deleted

## 2013-08-24 DIAGNOSIS — G8918 Other acute postprocedural pain: Secondary | ICD-10-CM

## 2013-08-24 MED ORDER — TRAMADOL HCL 50 MG PO TABS: 50.0000 mg | ORAL_TABLET | Freq: Four times a day (QID) | ORAL | Status: DC | PRN

## 2013-08-30 ENCOUNTER — Other Ambulatory Visit: Payer: Self-pay | Admitting: *Deleted

## 2013-08-30 DIAGNOSIS — I2510 Atherosclerotic heart disease of native coronary artery without angina pectoris: Secondary | ICD-10-CM

## 2013-08-31 ENCOUNTER — Encounter: Payer: Self-pay | Admitting: Cardiovascular Disease

## 2013-09-01 ENCOUNTER — Encounter: Payer: Self-pay | Admitting: Cardiothoracic Surgery

## 2013-09-01 ENCOUNTER — Ambulatory Visit: Payer: Self-pay | Attending: Internal Medicine | Admitting: Internal Medicine

## 2013-09-01 ENCOUNTER — Ambulatory Visit (INDEPENDENT_AMBULATORY_CARE_PROVIDER_SITE_OTHER): Payer: Self-pay | Admitting: Cardiothoracic Surgery

## 2013-09-01 ENCOUNTER — Encounter: Payer: Self-pay | Admitting: Internal Medicine

## 2013-09-01 ENCOUNTER — Ambulatory Visit
Admission: RE | Admit: 2013-09-01 | Discharge: 2013-09-01 | Disposition: A | Discharge status: Home / Self Care | Payer: Self-pay | Source: Ambulatory Visit | Attending: Cardiothoracic Surgery | Admitting: Cardiothoracic Surgery

## 2013-09-01 VITALS — BP 134/78 | HR 72 | Resp 16 | Ht 74.0 in | Wt 213.0 lb

## 2013-09-01 VITALS — BP 118/70 | HR 82 | Temp 98.7°F | Resp 14 | Ht 74.0 in | Wt 214.0 lb

## 2013-09-01 DIAGNOSIS — Z951 Presence of aortocoronary bypass graft: Secondary | ICD-10-CM

## 2013-09-01 DIAGNOSIS — M25569 Pain in unspecified knee: Secondary | ICD-10-CM

## 2013-09-01 DIAGNOSIS — I2510 Atherosclerotic heart disease of native coronary artery without angina pectoris: Secondary | ICD-10-CM

## 2013-09-01 DIAGNOSIS — E119 Type 2 diabetes mellitus without complications: Secondary | ICD-10-CM | POA: Insufficient documentation

## 2013-09-01 DIAGNOSIS — M79609 Pain in unspecified limb: Secondary | ICD-10-CM | POA: Insufficient documentation

## 2013-09-01 NOTE — Progress Notes (Signed)
    [address]       [place] [address]             [phone]                  Alex Yang [place] Medical Record #[id number] Date of Birth: 04/19/1956  Nahser, [name], MD No PCP Per Patient  Chief Complaint:   PostOp Follow Up Visit 08/02/2013   PREOPERATIVE DIAGNOSES: Acute non-ST segment elevation myocardial  infarction less than 72 hours, and 3-vessel coronary artery disease.  POSTOPERATIVE DIAGNOSES: Acute non-ST elevation myocardial infarction  less than 72 hours, and 3-vessel coronary artery disease.  SURGICAL PROCEDURE: Coronary artery bypass grafting x5 with the left  internal mammary to the left anterior descending coronary artery,  reverse saphenous vein graft to the intermediate coronary artery,  sequential reverse saphenous vein graft to the obtuse marginal and  distal circumflex, reverse saphenous vein graft to the posterior  descending, with right leg endo vein harvesting and TEE.  SURGEON: [name], MD   History of Present Illness:      Patient returns today in followup after his recent acute myocardial infarction followed by coronary artery bypass grafting. He's made very good progress postoperatively increasing his physical activity appropriately. He's had no symptoms of congestive heart failure. Has been very diligent about recording blood pressures and glucose. He was not known to be diabetic prior to surgery.    History  Smoking status  . Never Smoker   Smokeless tobacco  . Not on file       No Known Allergies  Current Outpatient Prescriptions  Medication Sig Dispense Refill  . Alcohol Swabs PADS Please clean finger prior to checking blood sugar  100 each  12  . amiodarone (PACERONE) 200 MG tablet Take 2 tablets (400 mg total) by mouth 2 (two) times daily. For 7 days, then decrease to 200 mg twice a day for 7 days, then decrease to 200 mg daily  90 tablet  1  . aspirin EC 81 MG EC tablet Take 1 tablet (81 mg  total) by mouth daily.      . atorvastatin (LIPITOR) 20 MG tablet Take 1 tablet (20 mg total) by mouth daily at 6 PM.  30 tablet  3  . clopidogrel (PLAVIX) 75 MG tablet Take 1 tablet (75 mg total) by mouth daily with breakfast.  30 tablet  3  . glucose blood test strip Use as instructed  100 each  12  . glucose monitoring kit (FREESTYLE) monitoring kit 1 each by Does not apply route as needed for other. Please choose glucometer of your choic  1 each  0  . L-ARGININE PO Take 1 tablet by mouth daily.      . LANCETS ULTRA THIN 30G MISC 1 Units by Does not apply route 4 (four) times daily. Breakfast, Lunch, Dinner, Bedtime  100 each  3  . lisinopril (PRINIVIL,ZESTRIL) 5 MG tablet Take 1 tablet (5 mg total) by mouth daily.  30 tablet  3  . metFORMIN (GLUCOPHAGE) 850 MG tablet Take 1 tablet (850 mg total) by mouth 2 (two) times daily with a meal.  60 tablet  3  . metoprolol tartrate (LOPRESSOR) 25 MG tablet Take 1 tablet (25 mg total) by mouth 2 (two) times daily.  60 tablet  3  . traMADol (ULTRAM) 50 MG tablet Take 1 tablet (50 mg total) by mouth every 6 (six) hours as needed.  30   tablet  0   No current facility-administered medications for this visit.       Physical Exam: BP 134/78  Pulse 72  Resp 16  Ht 6' 2" (1.88 m)  Wt 213 lb (96.616 kg)  BMI 27.34 kg/m2  SpO2 98%  General appearance: alert and cooperative Neurologic: intact Heart: regular rate and rhythm, S1, S2 normal, no murmur, click, rub or gallop Lungs: clear to auscultation bilaterally Abdomen: soft, non-tender; bowel sounds normal; no masses,  no organomegaly Extremities: extremities normal, atraumatic, no cyanosis or edema and Homans sign is negative, no sign of DVT Wound: His sternal incision is well-healed, he has some thickening around the right knee end of vein harvest site but without obvious infection   Diagnostic Studies & Laboratory data:         Recent Radiology Findings: Dg Chest 2 View  09/01/2013    CLINICAL DATA:  Heart surgery December 14th  EXAM: CHEST  2 VIEW  COMPARISON:  08/04/2013  FINDINGS: Status post CABG. Heart size and vascular pattern are normal. Left lung is clear. On the right, there is blunting of the costophrenic angle with mild elevation of the diaphragm. Right lung is clear. There is degenerative change in the left shoulder with 3 orthopedic screws noted.  IMPRESSION: Findings consistent with a small probably partially loculated subpulmonic pleural effusion on the right.   Electronically Signed   By: Raymond  Rubner M.D.   On: 09/01/2013 08:51      Recent Labs: Lab Results  Component Value Date   WBC 16.0* 08/05/2013   HGB 11.6* 08/05/2013   HCT 34.1* 08/05/2013   PLT 179 08/05/2013   GLUCOSE 132* 08/05/2013   CHOL 162 07/30/2013   TRIG 122 07/30/2013   HDL 46 07/30/2013   LDLCALC 92 07/30/2013   ALT 56* 07/29/2013   AST 41* 07/29/2013   NA 134* 08/05/2013   K 4.6 08/05/2013   CL 102 08/05/2013   CREATININE 0.90 08/05/2013   BUN 28* 08/05/2013   CO2 23 08/05/2013   TSH 2.457 07/30/2013   INR 1.40 08/02/2013   HGBA1C 12.3* 07/30/2013      Assessment / Plan:     Patient doing well following coronary artery bypass grafting New diagnosis of diabetes with coronary artery disease and recent acute myocardial infarction, at the time of surgery his hemoglobin A1c was elevated to 12.3  He has been referred to primary care and diabetes education these appointments are still pending. I reviewed with him limitations for the next several months to avoid any heavy lifting, but he may return to driving. He is working on arrangements for cardiac rehabilitation in [place], I plan to see him back when necessary as needed or at his cardiologist's request.    GERHARDT,EDWARD B 09/01/2013 10:19 AM         

## 2013-09-01 NOTE — Progress Notes (Signed)
Patient ID: Alex Yang, male   DOB: 05/17/1956, 58 y.o.   MRN: [id number]   CC:  HPI: 58 year old male with a history of recent 5-vessel coronary artery bypass surgery following an acute non-ST elevation MI, presents here for establishing care. He has been extremely diligent about checking his CBG and his blood pressure. His blood pressure stays in the 110 to 130 range. He is a newly diagnosed diabetic with an A1c of 12.3 in December His CBG is controlled on metformin  Allergies  Allergen Reactions  . Oxycodone    Past Medical History  Diagnosis Date  . NSTEMI (non-ST elevated myocardial infarction)   . Diabetes    Current Outpatient Prescriptions on File Prior to Visit  Medication Sig Dispense Refill  . amiodarone (PACERONE) 200 MG tablet Take 2 tablets (400 mg total) by mouth 2 (two) times daily. For 7 days, then decrease to 200 mg twice a day for 7 days, then decrease to 200 mg daily  90 tablet  1  . aspirin EC 81 MG EC tablet Take 1 tablet (81 mg total) by mouth daily.      Marland Kitchen atorvastatin (LIPITOR) 20 MG tablet Take 1 tablet (20 mg total) by mouth daily at 6 PM.  30 tablet  3  . clopidogrel (PLAVIX) 75 MG tablet Take 1 tablet (75 mg total) by mouth daily with breakfast.  30 tablet  3  . glucose blood test strip Use as instructed  100 each  12  . glucose monitoring kit (FREESTYLE) monitoring kit 1 each by Does not apply route as needed for other. Please choose glucometer of your choic  1 each  0  . L-ARGININE PO Take 1 tablet by mouth daily.      Marland Kitchen LANCETS ULTRA THIN 30G MISC 1 Units by Does not apply route 4 (four) times daily. Breakfast, Lunch, Dinner, Bedtime  100 each  3  . lisinopril (PRINIVIL,ZESTRIL) 5 MG tablet Take 1 tablet (5 mg total) by mouth daily.  30 tablet  3  . metFORMIN (GLUCOPHAGE) 850 MG tablet Take 1 tablet (850 mg total) by mouth 2 (two) times daily with a meal.  60 tablet  3  . metoprolol tartrate (LOPRESSOR) 25 MG tablet Take 1 tablet (25 mg total) by  mouth 2 (two) times daily.  60 tablet  3  . traMADol (ULTRAM) 50 MG tablet Take 1 tablet (50 mg total) by mouth every 6 (six) hours as needed.  30 tablet  0  . Alcohol Swabs PADS Please clean finger prior to checking blood sugar  100 each  12   No current facility-administered medications on file prior to visit.   Family History  Problem Relation Age of Onset  . Heart attack Mother   . Heart disease Mother   . Heart disease Father    History   Social History  . Marital Status: Divorced    Spouse Name: N/A    Number of Children: N/A  . Years of Education: N/A   Occupational History  . Process Server    Social History Main Topics  . Smoking status: Never Smoker   . Smokeless tobacco: Not on file  . Alcohol Use: No  . Drug Use: No  . Sexual Activity: Not on file   Other Topics Concern  . Not on file   Social History Narrative   Lives with S.O.    Review of Systems  Constitutional: Negative for fever, chills, diaphoresis, activity change, appetite change and  fatigue.  HENT: Negative for ear pain, nosebleeds, congestion, facial swelling, rhinorrhea, neck pain, neck stiffness and ear discharge.   Eyes: Negative for pain, discharge, redness, itching and visual disturbance.  Respiratory: Negative for cough, choking, chest tightness, shortness of breath, wheezing and stridor.   Cardiovascular: Negative for chest pain, palpitations and leg swelling.  Gastrointestinal: Negative for abdominal distention.  Genitourinary: Negative for dysuria, urgency, frequency, hematuria, flank pain, decreased urine volume, difficulty urinating and dyspareunia.  Musculoskeletal: Negative for back pain, joint swelling, arthralgias and gait problem.  Neurological: Negative for dizziness, tremors, seizures, syncope, facial asymmetry, speech difficulty, weakness, light-headedness, numbness and headaches.  Hematological: Negative for adenopathy. Does not bruise/bleed easily.  Psychiatric/Behavioral:  Negative for hallucinations, behavioral problems, confusion, dysphoric mood, decreased concentration and agitation.    Objective:   Filed Vitals:   09/01/13 1216  BP: 118/70  Pulse: 82  Temp: 98.7 F (37.1 C)  Resp: 14    Physical Exam  Constitutional: Appears well-developed and well-nourished. No distress.  HENT: Normocephalic. External right and left ear normal. Oropharynx is clear and moist.  Eyes: Conjunctivae and EOM are normal. PERRLA, no scleral icterus.  Neck: Normal ROM. Neck supple. No JVD. No tracheal deviation. No thyromegaly.  CVS: RRR, S1/S2 +, no murmurs, no gallops, no carotid bruit.  Pulmonary: Effort and breath sounds normal, no stridor, rhonchi, wheezes, rales.  Abdominal: Soft. BS +,  no distension, tenderness, rebound or guarding.  Musculoskeletal: Normal range of motion. No edema and no tenderness.  Lymphadenopathy: No lymphadenopathy noted, cervical, inguinal. Neuro: Alert. Normal reflexes, muscle tone coordination. No cranial nerve deficit. Skin: Skin is warm and dry. No rash noted. Not diaphoretic. No erythema. No pallor.  Psychiatric: Normal mood and affect. Behavior, judgment, thought content normal.   Lab Results  Component Value Date   WBC 16.0* 08/05/2013   HGB 11.6* 08/05/2013   HCT 34.1* 08/05/2013   MCV 92.2 08/05/2013   PLT 179 08/05/2013   Lab Results  Component Value Date   CREATININE 0.90 08/05/2013   BUN 28* 08/05/2013   NA 134* 08/05/2013   K 4.6 08/05/2013   CL 102 08/05/2013   CO2 23 08/05/2013    Lab Results  Component Value Date   HGBA1C 12.3* 07/30/2013   Lipid Panel     Component Value Date/Time   CHOL 162 07/30/2013 0040   TRIG 122 07/30/2013 0040   HDL 46 07/30/2013 0040   CHOLHDL 3.5 07/30/2013 0040   VLDL 24 07/30/2013 0040   LDLCALC 92 07/30/2013 0040       Assessment and plan:   Patient Active Problem List   Diagnosis Date Noted  . Cerebral vascular disease 08/15/2013  . DM (diabetes mellitus)  08/05/2013  . S/P CABG x 5 08/02/2013  . Type II or unspecified type diabetes mellitus with unspecified complication, uncontrolled 08/01/2013  . NSTEMI (non-ST elevated myocardial infarction) 07/29/2013       CABG Healing well Complaining of right leg pain Will obtain Doppler ultrasound to rule out DVT   Diabetes Currently controlled on metformin Return in one month to assess as the patient needs he started on insulin  Followup in one month  The patient was given clear instructions to go to ER or return to medical center if symptoms don't improve, worsen or new problems develop. The patient verbalized understanding. The patient was told to call to get any lab results if not heard anything in the next week.

## 2013-09-01 NOTE — Patient Instructions (Signed)
    [address]       [place] [address]             [phone]       Coronary Artery Bypass Grafting  Care After  Refer to this sheet in the next few weeks. These instructions provide you with information on caring for yourself after your procedure. Your caregiver may also give you more specific instructions. Your treatment has been planned according to current medical practices, but problems sometimes occur. Call your caregiver if you have any problems or questions after your procedure.  Recovery from open heart surgery will be different for everyone. Some people feel well after 3 or 4 weeks, while for others it takes longer. After heart surgery, it may be normal to:  Not have an appetite, feel nauseated by the smell of food, or only want to eat a small amount.   Be constipated because of changes in your diet, activity, and medicines. Eat foods high in fiber. Add fresh fruits and vegetables to your diet. Stool softeners may be helpful.   Feel sad or unhappy. You may be frustrated or cranky. You may have good days and bad days. Do not give up. Talk to your caregiver if you do not feel better.   Feel weakness and fatigue. You many need physical therapy or cardiac rehabilitation to get your strength back.   Develop an irregular heartbeat called atrial fibrillation. Symptoms of atrial fibrillation are a fast, irregular heartbeat or feelings of fluttery heartbeats, shortness of breath, low blood pressure, and dizziness. If these symptoms develop, see your caregiver right away.  MEDICATION  Have a list of all the medicines you will be taking when you leave the hospital. For every medicine, know the following:   Name.   Exact dose.   Time of day to be taken.   How often it should be taken.   Why you are taking it.   Ask which medicines should or should not be taken together. If you take more than one heart medicine, ask if it is okay to take them together. Some  heart medicines should not be taken at the same time because they may lower your blood pressure too much.   Narcotic pain medicine can cause constipation. Eat fresh fruits and vegetables. Add fiber to your diet. Stool softener medicine may help relieve constipation.   Keep a copy of your medicines with you at all times.   Do not add or stop taking any medicine until you check with your caregiver.   Medicines can have side effects. Call your caregiver who prescribed the medicine if you:   Start throwing up, have diarrhea, or have stomach pain.   Feel dizzy or lightheaded when you stand up.   Feel your heart is skipping beats or is beating too fast or too slow.   Develop a rash.   Notice unusual bruising or bleeding.  HOME CARE INSTRUCTIONS  After heart surgery, it is important to learn how to take your pulse. Have your caregiver show you how to take your pulse.   Use your incentive spirometer. Ask your caregiver how long after surgery you need to use it.  Care of your chest incision  Tell your caregiver right away if you notice clicking in your chest (sternum).   Support your chest with a pillow or your arms when you take deep breaths and cough.   Follow your caregiver's instructions about when you can bathe or   swim.   Protect your incision from sunlight during the first year to keep the scar from getting dark.   Tell your caregiver if you notice:   Increased tenderness of your incision.   Increased redness or swelling around your incision.   Drainage or pus from your incision.  Care of your leg incision(s)  Avoid crossing your legs.   Avoid sitting for long periods of time. Change positions every half hour.   Elevate your leg(s) when you are sitting.   Check your leg(s) daily for swelling. Check the incisions for redness or drainage.   Diet is very important to heart health.   Eat plenty of fresh fruits and vegetables. Meats should be lean cut. Avoid canned,  processed, and fried foods.   Talk to a dietician. They can teach you how to make healthy food and drink choices.  Weight  Weigh yourself every day. This is important because it helps to know if you are retaining fluid that may make your heart and lungs work harder.   Use the same scale each time.   Weigh yourself every morning at the same time. You should do this after you go to the bathroom, but before you eat breakfast.   Your weight will be more accurate if you do not wear any clothes.   Record your weight.   Tell your caregiver if you have gained 2 pounds or more overnight.  Activity Stop any activity at once if you have chest pain, shortness of breath, irregular heartbeats, or dizziness. Get help right away if you have any of these symptoms.  Bathing.  Avoid soaking in a bath or hot tub until your incisions are healed.   Rest. You need a balance of rest and activity.   Exercise. Exercise per your caregiver's advice. You may need physical therapy or cardiac rehabilitation to help strengthen your muscles and build your endurance.   Climbing stairs. Unless your caregiver tells you not to climb stairs, go up stairs slowly and rest if you tire. Do not pull yourself up by the handrail.   Driving a car. Follow your caregiver's advice on when you may drive. You may ride as a passenger at any time. When traveling for long periods of time in a car, get out of the car and walk around for a few minutes every 2 hours.   Lifting. Avoid lifting, pushing, or pulling anything heavier than 10 pounds for 6 weeks after surgery or as told by your caregiver.   Returning to work. Check with your caregiver. People heal at different rates. Most people will be able to go back to work 6 to 12 weeks after surgery.   Sexual activity. You may resume sexual relations as told by your caregiver.  SEEK MEDICAL CARE IF:  Any of your incisions are red, painful, or have any type of drainage coming from them.     You have an oral temperature above 101.5 F .   You have ankle or leg swelling.   You have pain in your legs.   You have weight gain of 2 or more pounds a day.   You feel dizzy or lightheaded when you stand up.  SEEK IMMEDIATE MEDICAL CARE IF:  You have angina or chest pain that goes to your jaw or arms. Call your local emergency services right away.   You have shortness of breath at rest or with activity.   You have a fast or irregular heartbeat (arrhythmia).   There is   a "clicking" in your sternum when you move.   You have numbness or weakness in your arms or legs.  MAKE SURE YOU:  Understand these instructions.   Will watch your condition.   Will get help right away if you are not doing well or get worse.    No lifting over 25 lbs for 3 months 

## 2013-09-01 NOTE — Progress Notes (Signed)
Pt is here for a hospital f/u for open heart surgery. Only complains pain and swelling on inner Rt leg from surgery. Just an irritating pain. No other complaints.

## 2013-09-05 ENCOUNTER — Other Ambulatory Visit: Payer: Self-pay | Admitting: *Deleted

## 2013-09-05 DIAGNOSIS — G8918 Other acute postprocedural pain: Secondary | ICD-10-CM

## 2013-09-05 MED ORDER — TRAMADOL HCL 50 MG PO TABS: 50.0000 mg | ORAL_TABLET | Freq: Four times a day (QID) | ORAL | Status: DC | PRN

## 2013-09-06 ENCOUNTER — Encounter: Payer: Self-pay | Admitting: Cardiovascular Disease

## 2013-09-06 ENCOUNTER — Ambulatory Visit (INDEPENDENT_AMBULATORY_CARE_PROVIDER_SITE_OTHER): Payer: Self-pay | Admitting: Cardiovascular Disease

## 2013-09-06 VITALS — BP 127/83 | HR 77 | Ht 74.0 in | Wt 209.0 lb

## 2013-09-06 DIAGNOSIS — Z951 Presence of aortocoronary bypass graft: Secondary | ICD-10-CM

## 2013-09-06 DIAGNOSIS — I2510 Atherosclerotic heart disease of native coronary artery without angina pectoris: Secondary | ICD-10-CM

## 2013-09-06 DIAGNOSIS — E119 Type 2 diabetes mellitus without complications: Secondary | ICD-10-CM

## 2013-09-06 DIAGNOSIS — E785 Hyperlipidemia, unspecified: Secondary | ICD-10-CM | POA: Insufficient documentation

## 2013-09-06 MED ORDER — LISINOPRIL 5 MG PO TABS: 5.0000 mg | ORAL_TABLET | Freq: Every day | ORAL | Status: DC

## 2013-09-06 MED ORDER — ATORVASTATIN CALCIUM 20 MG PO TABS: 20.0000 mg | ORAL_TABLET | Freq: Every day | ORAL | Status: DC

## 2013-09-06 MED ORDER — CLOPIDOGREL BISULFATE 75 MG PO TABS: 75.0000 mg | ORAL_TABLET | Freq: Every day | ORAL | Status: DC

## 2013-09-06 MED ORDER — METFORMIN HCL 850 MG PO TABS: 850.0000 mg | ORAL_TABLET | Freq: Two times a day (BID) | ORAL | Status: DC

## 2013-09-06 MED ORDER — METOPROLOL TARTRATE 50 MG PO TABS: 50.0000 mg | ORAL_TABLET | Freq: Two times a day (BID) | ORAL | Status: DC

## 2013-09-06 MED ORDER — AMIODARONE HCL 200 MG PO TABS: 200.0000 mg | ORAL_TABLET | Freq: Every day | ORAL | Status: DC

## 2013-09-06 NOTE — Patient Instructions (Signed)
Your physician has recommended you make the following change in your medication:  INCREASE METOPROLOL TO 50 MG TWICE DAILY 12 HOURS APART AMIODARONE 200 MG ONCE DAILY/// YOU WILL STOP AMIODARONE IN 1 MONTH  ABOUT 10/07/13   Your physician recommends that you schedule a follow-up appointment in: 3 MONTHS ABOUT 12/05/13 WITH AN EKG    Your physician recommends that you return for a FASTING lipid profile: 3 MONTHS

## 2013-09-06 NOTE — Progress Notes (Signed)
Alex Yang Date of Birth  1956-04-01       [facility] [address], [address], [place], [place]  [address]   [place], [place]  [address] [phone]     [phone]   Fax  [phone]    Fax [phone]  Problem List: 1. Coronary artery disease status post coronary artery bypass grafting 2. Type 2 diabetes mellitus 3. Hyperlipidemia 4. Atrial fibrillation - converted after being started on amiodarone.  History of Present Illness:  Alex Yang is a 58 year old gentleman who I have seen in the past. He was admitted on December 12 with a non-ST segment elevation myocardial infarction. He was found to have significant coronary artery disease and had coronary artery bypass grafting.  He developed paroxysmal atrial fibrillation following surgery. He was started on amiodarone. He converted to sinus rhythm and did not require cardioversion.    He was newly diagnosed with diabetes.   He is walking - 25 minutes yesterday.    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    Current Outpatient Prescriptions on File Prior to Visit  Medication Sig Dispense Refill  . Alcohol Swabs PADS Please clean finger prior to checking blood sugar  100 each  12  . amiodarone (PACERONE) 200 MG tablet Take 2 tablets (400 mg total) by mouth 2 (two) times daily. For 7 days, then decrease to 200 mg twice a day for 7 days, then decrease to 200 mg daily  90 tablet  1  . aspirin EC 81 MG EC tablet Take 1 tablet (81 mg total) by mouth daily.      Marland Kitchen atorvastatin (LIPITOR) 20 MG tablet Take 1 tablet (20 mg total) by mouth daily at 6 PM.  30 tablet  3  . clopidogrel (PLAVIX) 75 MG tablet Take 1 tablet (75 mg total) by mouth daily with breakfast.  30 tablet  3  . glucose blood test strip Use as instructed  100 each  12  . glucose monitoring kit (FREESTYLE) monitoring kit 1 each by Does not apply route as needed for other. Please choose glucometer of your choic   1 each  0  . L-ARGININE PO Take 1 tablet by mouth daily.      Marland Kitchen LANCETS ULTRA THIN 30G MISC 1 Units by Does not apply route 4 (four) times daily. Breakfast, Lunch, Dinner, Bedtime  100 each  3  . lisinopril (PRINIVIL,ZESTRIL) 5 MG tablet Take 1 tablet (5 mg total) by mouth daily.  30 tablet  3  . metFORMIN (GLUCOPHAGE) 850 MG tablet Take 1 tablet (850 mg total) by mouth 2 (two) times daily with a meal.  60 tablet  3  . metoprolol tartrate (LOPRESSOR) 25 MG tablet Take 1 tablet (25 mg total) by mouth 2 (two) times daily.  60 tablet  3  . traMADol (ULTRAM) 50 MG tablet Take 1 tablet (50 mg total) by mouth every 6 (six) hours as needed.  30 tablet  0   No current facility-administered medications on file prior to visit.    Allergies  Allergen Reactions  . Oxycodone     Past Medical History  Diagnosis Date  . NSTEMI (non-ST elevated myocardial infarction)   . Diabetes     Past Surgical History  Procedure Laterality Date  . Fracture surgery      left arm  . Nasal fracture surgery    . Coronary artery bypass graft  N/A 08/02/2013    Procedure: CORONARY ARTERY BYPASS GRAFTING (CABG);  Surgeon: Grace Isaac, MD;  Location: [facility];  Service: Open Heart Surgery;  Laterality: N/A;  Coronary artery bypass graft times five utilizing the left internal mammary artery and the right greater saphenous vein.  . Intraoperative transesophageal echocardiogram N/A 08/02/2013    Procedure: INTRAOPERATIVE TRANSESOPHAGEAL ECHOCARDIOGRAM;  Surgeon: Grace Isaac, MD;  Location: [facility];  Service: Open Heart Surgery;  Laterality: N/A;  . Endovein harvest of greater saphenous vein Right 08/02/2013    Procedure: ENDOVEIN HARVEST OF GREATER SAPHENOUS VEIN;  Surgeon: Grace Isaac, MD;  Location: [facility];  Service: Open Heart Surgery;  Laterality: Right;    History  Smoking status  . Never Smoker   Smokeless tobacco  . Not on file    History  Alcohol Use No    Family History  Problem Relation  Age of Onset  . Heart attack Mother   . Heart disease Mother   . Heart disease Father     Reviw of Systems:  Reviewed in the HPI.  All other systems are negative.  Physical Exam: Blood pressure 127/83, pulse 77, height 6' 2"  (1.88 m), weight 209 lb (94.802 kg). General: Well developed, well nourished, in no acute distress.  Head: Normocephalic, atraumatic, sclera non-icteric, mucus membranes are moist,   Neck: Supple. Carotids are 2 + without bruits. No JVD   Lungs: Clear   Heart: RR, slightly tachycardic  Abdomen: Soft, non-tender, non-distended with normal bowel sounds.  Msk:  Strength and tone are normal   Extremities: No clubbing or cyanosis. No edema.  Distal pedal pulses are 2+ and equal .  SVG harvest site is slightly swollen and tender, no drainage.   Neuro: CN II - XII intact.  Alert and oriented X 3.   Psych:  Normal   ECG:  Assessment / Plan:

## 2013-09-06 NOTE — Assessment & Plan Note (Signed)
Seems to well. He status post coronary artery bypass grafting. He seems to be healing up slowly but steadily after surgery. We'll continue with the same medications.  He had a brief episode of paroxysmal atrial fibrillation following his bypass surgery.  He converted to normal sinus rhythm on  Amiodarone.     I think it 1 more month of amiodarone will be sufficient.

## 2013-09-13 ENCOUNTER — Other Ambulatory Visit: Payer: Self-pay | Admitting: *Deleted

## 2013-09-13 DIAGNOSIS — G8918 Other acute postprocedural pain: Secondary | ICD-10-CM

## 2013-09-13 MED ORDER — TRAMADOL HCL 50 MG PO TABS: 50.0000 mg | ORAL_TABLET | Freq: Four times a day (QID) | ORAL | Status: DC | PRN

## 2013-09-19 ENCOUNTER — Encounter: Payer: Self-pay | Admitting: Family Medicine

## 2013-09-20 ENCOUNTER — Ambulatory Visit (HOSPITAL_BASED_OUTPATIENT_CLINIC_OR_DEPARTMENT_OTHER)
Admission: RE | Admit: 2013-09-20 | Discharge: 2013-09-20 | Disposition: A | Discharge status: Home / Self Care | Payer: BC Managed Care – PPO | Source: Ambulatory Visit | Attending: Family Medicine | Admitting: Family Medicine

## 2013-09-20 ENCOUNTER — Encounter: Payer: Self-pay | Admitting: Family Medicine

## 2013-09-20 ENCOUNTER — Ambulatory Visit (INDEPENDENT_AMBULATORY_CARE_PROVIDER_SITE_OTHER): Payer: BC Managed Care – PPO | Admitting: Family Medicine

## 2013-09-20 VITALS — BP 114/80 | HR 76 | Temp 96.9°F | Resp 18 | Ht 72.0 in | Wt 207.0 lb

## 2013-09-20 DIAGNOSIS — I2510 Atherosclerotic heart disease of native coronary artery without angina pectoris: Secondary | ICD-10-CM

## 2013-09-20 DIAGNOSIS — I480 Paroxysmal atrial fibrillation: Secondary | ICD-10-CM

## 2013-09-20 DIAGNOSIS — E118 Type 2 diabetes mellitus with unspecified complications: Secondary | ICD-10-CM

## 2013-09-20 DIAGNOSIS — M79604 Pain in right leg: Secondary | ICD-10-CM

## 2013-09-20 DIAGNOSIS — I10 Essential (primary) hypertension: Secondary | ICD-10-CM

## 2013-09-20 DIAGNOSIS — M79609 Pain in unspecified limb: Secondary | ICD-10-CM | POA: Insufficient documentation

## 2013-09-20 DIAGNOSIS — G8918 Other acute postprocedural pain: Secondary | ICD-10-CM

## 2013-09-20 DIAGNOSIS — I4891 Unspecified atrial fibrillation: Secondary | ICD-10-CM

## 2013-09-20 DIAGNOSIS — Z951 Presence of aortocoronary bypass graft: Secondary | ICD-10-CM | POA: Insufficient documentation

## 2013-09-20 DIAGNOSIS — M7989 Other specified soft tissue disorders: Secondary | ICD-10-CM | POA: Insufficient documentation

## 2013-09-20 DIAGNOSIS — IMO0002 Reserved for concepts with insufficient information to code with codable children: Secondary | ICD-10-CM

## 2013-09-20 DIAGNOSIS — E1165 Type 2 diabetes mellitus with hyperglycemia: Secondary | ICD-10-CM

## 2013-09-20 MED ORDER — TRAMADOL HCL 50 MG PO TABS
50.0000 mg | ORAL_TABLET | Freq: Four times a day (QID) | ORAL | Status: DC | PRN
Start: 2013-09-20 — End: 2013-09-27

## 2013-09-20 NOTE — Assessment & Plan Note (Signed)
Post-operative 07/2013 after CABG, has been in sinus rhythm since being put on amiodarone. As per cardiologist, plan is to finish out the month of February on this med and then d/c it.

## 2013-09-20 NOTE — Assessment & Plan Note (Signed)
New dx 07/2013. Home monitoring is meticulous and showing good control for at least the last 2 wks. Continue metformin 850mg  bid.  He has nutritionist eval appt set. He is highly motivated to self manage.

## 2013-09-20 NOTE — Progress Notes (Signed)
Pre visit review using our clinic review tool, if applicable. No additional management support is needed unless otherwise documented below in the visit note. 

## 2013-09-20 NOTE — Assessment & Plan Note (Addendum)
With firm, palpable cord-type texture to soft tissue at vein harvesting site on medial aspect of leg. His skin wound from the harvest is healing appropriately. Will check venous u/s today to make sure no DVT. Discussed use of tylenol 1000 mg q6h prn, and may continue to use tramadol 50mg  q6h prn but was encouraged to try to ween down off of this if possible.

## 2013-09-20 NOTE — Assessment & Plan Note (Signed)
Stable s/p CABG. He is s/p CV surg and cardiologist f/u x 1 since surgery. Healing well.  Set up to start cardiac rehab soon. Continue ASA, plavix, lisinopril, and lopressor.

## 2013-09-20 NOTE — Progress Notes (Signed)
Office Note 09/20/2013  CC:  Chief Complaint  Patient presents with  . Establish Care    HPI:  Alex Yang is a 58 y.o. White male who is here with his girlfriend today to establish care. Patient's most recent primary MD: none Old records in EPIC/HL EMR were reviewed prior to or during today's visit.  He reports feeling well.  He only complains of a firm, mildly painful region of medial portion of right leg from thigh down below knee where they harvested a vein for his CABG 07/2013.   Reviewed glucoses since late 07/2013.  They have gradually come down and over the last 2 wks or so his fasting range has been 115-130 and his 2H PP has consistently been <140. Has appt with nutritionist.  Home bp and HR monitoring show normal readings consistently.  Hx of post-op a-fib: plan for amiodarone is to d/c it after this month per Dr. Acie Fredrickson. Pt has eye MD named Dr. Truman Hayward at [facility] in [place].  He is due for his first eye exam as a known diabetic, but he relates that he was told in the past by this eye doctor that some retinal hemorrhage was noted and he was instructed to see a PCP but he never did.   Past Medical History  Diagnosis Date  . NSTEMI (non-ST elevated myocardial infarction) 07/2013  . Diabetes 07/2013  . Hyperlipidemia   . Hypertension   . Paroxysmal atrial fibrillation 07/2013    Started post-op CABG, converted on amiodarone    Past Surgical History  Procedure Laterality Date  . Shoulder surgery  1983    left; pins are in place  . Nasal fracture surgery  1979  . Coronary artery bypass graft N/A 08/02/2013    Procedure: CORONARY ARTERY BYPASS GRAFTING (CABG);  Surgeon: Grace Isaac, MD;  Location: [facility];  Service: Open Heart Surgery;  Laterality: N/A;  Coronary artery bypass graft times five utilizing the left internal mammary artery and the right greater saphenous vein.  . Intraoperative transesophageal echocardiogram N/A 08/02/2013    Procedure: INTRAOPERATIVE  TRANSESOPHAGEAL ECHOCARDIOGRAM;  Surgeon: Grace Isaac, MD;  Location: [place];  Service: Open Heart Surgery;  Laterality: N/A;.  EF 40%, LVH and LV dilatation, inf wall hypokinesis (at the time of his MI)  . Endovein harvest of greater saphenous vein Right 08/02/2013    Procedure: ENDOVEIN HARVEST OF GREATER SAPHENOUS VEIN;  Surgeon: Grace Isaac, MD;  Location: [place];  Service: Open Heart Surgery;  Laterality: Right;    Family History  Problem Relation Age of Onset  . Heart attack Mother   . Heart disease Mother   . Heart disease Father     History   Social History  . Marital Status: Divorced    Spouse Name: N/A    Number of Children: N/A  . Years of Education: N/A   Occupational History  . Process Server    Social History Main Topics  . Smoking status: Never Smoker   . Smokeless tobacco: Not on file  . Alcohol Use: No  . Drug Use: No  . Sexual Activity: Not on file   Other Topics Concern  . Not on file   Social History Narrative   Single, lives with girlfriend.   Has 3 biologic children.   Occupation:  Theme park manager (self employed).   Education: HS   No tobacco, alc, or drugs   Starts cardiac rehab 10/06/13.    Outpatient Encounter Prescriptions as of 09/20/2013  Medication Sig  . amiodarone (PACERONE) 200 MG tablet Take 1 tablet (200 mg total) by mouth daily. For 7 days, then decrease to 200 mg twice a day for 7 days, then decrease to 200 mg daily  . aspirin EC 81 MG EC tablet Take 1 tablet (81 mg total) by mouth daily.  Marland Kitchen atorvastatin (LIPITOR) 20 MG tablet Take 1 tablet (20 mg total) by mouth daily at 6 PM.  . clopidogrel (PLAVIX) 75 MG tablet Take 1 tablet (75 mg total) by mouth daily with breakfast.  . lisinopril (PRINIVIL,ZESTRIL) 5 MG tablet Take 1 tablet (5 mg total) by mouth daily.  . metFORMIN (GLUCOPHAGE) 850 MG tablet Take 1 tablet (850 mg total) by mouth 2 (two) times daily with a meal.  . metoprolol tartrate (LOPRESSOR) 50 MG tablet Take 1 tablet  (50 mg total) by mouth 2 (two) times daily.  . traMADol (ULTRAM) 50 MG tablet Take 1 tablet (50 mg total) by mouth every 6 (six) hours as needed.  . [DISCONTINUED] traMADol (ULTRAM) 50 MG tablet Take 1 tablet (50 mg total) by mouth every 6 (six) hours as needed.  . Alcohol Swabs PADS Please clean finger prior to checking blood sugar  . glucose blood test strip Use as instructed  . glucose monitoring kit (FREESTYLE) monitoring kit 1 each by Does not apply route as needed for other. Please choose glucometer of your choic  . L-ARGININE PO Take 1 tablet by mouth daily.  Marland Kitchen LANCETS ULTRA THIN 30G MISC 1 Units by Does not apply route 4 (four) times daily. Breakfast, Lunch, Dinner, Bedtime    Allergies  Allergen Reactions  . Oxycodone Other (See Comments)    Psych/mental adverse effects    ROS Review of Systems  Constitutional: Negative for fever, chills, appetite change and fatigue.  HENT: Negative for congestion, dental problem, ear pain and sore throat.   Eyes: Negative for discharge, redness and visual disturbance.  Respiratory: Negative for cough, chest tightness, shortness of breath and wheezing.   Cardiovascular: Negative for chest pain, palpitations and leg swelling.  Gastrointestinal: Negative for nausea, vomiting, abdominal pain, diarrhea and blood in stool.  Genitourinary: Negative for dysuria, urgency, frequency, hematuria, flank pain and difficulty urinating.  Musculoskeletal: Negative for arthralgias, back pain, joint swelling, myalgias and neck stiffness.       Leg pain (right) as in HPI  Skin: Negative for pallor and rash.  Neurological: Negative for dizziness, speech difficulty, weakness and headaches.  Hematological: Negative for adenopathy. Does not bruise/bleed easily.  Psychiatric/Behavioral: Negative for confusion, sleep disturbance and dysphoric mood. The patient is not nervous/anxious.      PE; Blood pressure 114/80, pulse 76, temperature 96.9 F (36.1 C),  temperature source Temporal, resp. rate 18, height 6' (1.829 m), weight 207 lb (93.895 kg), SpO2 98.00%. Gen: Alert, well appearing.  Patient is oriented to person, place, time, and situation. AFFECT: pleasant, lucid thought and speech. OVF:IEPP: no injection, icteris, swelling, or exudate.  EOMI, PERRLA. Mouth: lips without lesion/swelling.  Oral mucosa pink and moist. Oropharynx without erythema, exudate, or swelling.  Neck - No masses or thyromegaly or limitation in range of motion CV: RRR, no m/r/g.  Chest wall with well healing midline sternotomy scar. LUNGS: CTA bilat, nonlabored resps, good aeration in all lung fields. ABD: soft, NT/ND EXT: no clubbing, cyanosis, or edema.  Right leg: medial aspect has an indurated cord palpable from upper mid thigh region extending down to a few inches below the knee.  No erythema or  warmth.  Mild TTP along the course of this abnormality.  He can flex and extend leg without problem. Neuro: CN 2-12 intact bilaterally, strength 5/5 in proximal and distal upper extremities and lower extremities bilaterally.   No tremor.    No ataxia.   No pronator drift.  Pertinent labs:  None today  Lab Results  Component Value Date   WBC 16.0* 08/05/2013   HGB 11.6* 08/05/2013   HCT 34.1* 08/05/2013   MCV 92.2 08/05/2013   PLT 179 08/05/2013     Chemistry      Component Value Date/Time   NA 134* 08/05/2013 0450   K 4.6 08/05/2013 0450   CL 102 08/05/2013 0450   CO2 23 08/05/2013 0450   BUN 28* 08/05/2013 0450   CREATININE 0.90 08/05/2013 0450      Component Value Date/Time   CALCIUM 8.2* 08/05/2013 0450   ALKPHOS 90 07/29/2013 1553   AST 41* 07/29/2013 1553   ALT 56* 07/29/2013 1553   BILITOT 0.5 07/29/2013 1553     Lab Results  Component Value Date   TSH 2.457 07/30/2013   Lab Results  Component Value Date   CHOL 162 07/30/2013   HDL 46 07/30/2013   LDLCALC 92 07/30/2013   TRIG 122 07/30/2013   CHOLHDL 3.5 07/30/2013     ASSESSMENT AND  PLAN:   New pt: no old records to obtain.  Type II or unspecified type diabetes mellitus with unspecified complication, uncontrolled New dx 07/2013. Home monitoring is meticulous and showing good control for at least the last 2 wks. Continue metformin 840m bid.  He has nutritionist eval appt set. He is highly motivated to self manage.  CAD (coronary artery disease) Stable s/p CABG. He is s/p CV surg and cardiologist f/u x 1 since surgery. Healing well.  Set up to start cardiac rehab soon. Continue ASA, plavix, lisinopril, and lopressor.  PAF (paroxysmal atrial fibrillation) Post-operative 07/2013 after CABG, has been in sinus rhythm since being put on amiodarone. As per cardiologist, plan is to finish out the month of February on this med and then d/c it.  Right leg pain With firm, palpable cord-type texture to soft tissue at vein harvesting site on medial aspect of leg. His skin wound from the harvest is healing appropriately. Will check venous u/s today to make sure no DVT. Discussed use of tylenol 1000 mg q6h prn, and may continue to use tramadol [id number] prn but was encouraged to try to ween down off of this if possible.  An After Visit Summary was printed and given to the patient.  Return in about 2 months (around 11/18/2013) for f/u DM, CAD.

## 2013-09-23 ENCOUNTER — Other Ambulatory Visit: Payer: Self-pay | Admitting: Family Medicine

## 2013-09-23 DIAGNOSIS — Z951 Presence of aortocoronary bypass graft: Secondary | ICD-10-CM

## 2013-09-23 DIAGNOSIS — I2510 Atherosclerotic heart disease of native coronary artery without angina pectoris: Secondary | ICD-10-CM

## 2013-09-23 DIAGNOSIS — E785 Hyperlipidemia, unspecified: Secondary | ICD-10-CM

## 2013-09-23 DIAGNOSIS — E119 Type 2 diabetes mellitus without complications: Secondary | ICD-10-CM

## 2013-09-23 MED ORDER — METFORMIN HCL 850 MG PO TABS: 850.0000 mg | ORAL_TABLET | Freq: Two times a day (BID) | ORAL | Status: DC

## 2013-09-23 NOTE — Telephone Encounter (Signed)
Patient's wife called requesting refill on metformin.  Medication sent to pharmacy per [place] protocol.

## 2013-09-27 ENCOUNTER — Telehealth: Payer: Self-pay | Admitting: Family Medicine

## 2013-09-27 MED ORDER — TRAMADOL HCL 50 MG PO TABS: 50.0000 mg | ORAL_TABLET | Freq: Four times a day (QID) | ORAL | Status: DC | PRN

## 2013-09-27 NOTE — Telephone Encounter (Signed)
Spoke with patient, he is on his way to [place] which is why he requested refill.  Patient still has 8 tabs left, I told him that as long as he is still in [place] I will fax to pharmacy if he can get me a fax number.  Otherwise, I will just fax it to his local pharmacy to pick up once he returns.  Patient aware of Dr. Idelle Leech treatment plan to ween down to non-narcotic pain medication as well.  Patient also states that he is trying to take less and has begun to take half tabs instead of full tabs.

## 2013-09-27 NOTE — Telephone Encounter (Signed)
Refill request sent from [facility] for tramadol for patient.  He was just seen on 09/20/13 and got tramadol rx at that time.  Please advise refill.

## 2013-09-27 NOTE — Telephone Encounter (Signed)
Patient returned call and had me fax Rx to a [facility] in [place].

## 2013-09-27 NOTE — Telephone Encounter (Signed)
OK, will authorize #30 more of these, but if further pain treatment needed after this then we'll have to ween him down to a non-narcotic type pain med.-thx

## 2013-10-03 ENCOUNTER — Ambulatory Visit: Payer: Self-pay | Admitting: Internal Medicine

## 2013-10-05 ENCOUNTER — Telehealth: Payer: Self-pay

## 2013-10-05 NOTE — Telephone Encounter (Signed)
[facility] requesting Rx refill on Tramadol 50 mg.

## 2013-10-06 NOTE — Telephone Encounter (Signed)
Called pt to let him know, LMOM to CB.

## 2013-10-06 NOTE — Telephone Encounter (Signed)
Sorry, but no. If his pain is requiring this med still then I recommend he get his leg checked out again by his vascular surgeon. I recommend 1000 mg tylenol every 6 hours as needed.

## 2013-10-07 NOTE — Telephone Encounter (Signed)
Spoke with pt, he doesn't need the tramadol and hasnt been really taking them. He states he only takes them for his flare ups occasionally. He feels he will not need anymore of this medication.

## 2013-10-07 NOTE — Telephone Encounter (Signed)
Called pt, LM with wife to CB.

## 2013-10-13 ENCOUNTER — Ambulatory Visit: Payer: Self-pay | Admitting: *Deleted

## 2013-11-17 ENCOUNTER — Ambulatory Visit (INDEPENDENT_AMBULATORY_CARE_PROVIDER_SITE_OTHER): Payer: BC Managed Care – PPO | Admitting: Family Medicine

## 2013-11-17 ENCOUNTER — Encounter: Payer: Self-pay | Admitting: Family Medicine

## 2013-11-17 VITALS — BP 109/75 | HR 88 | Temp 97.8°F | Resp 18 | Ht 72.0 in | Wt 202.0 lb

## 2013-11-17 DIAGNOSIS — IMO0001 Reserved for inherently not codable concepts without codable children: Secondary | ICD-10-CM

## 2013-11-17 DIAGNOSIS — E1165 Type 2 diabetes mellitus with hyperglycemia: Principal | ICD-10-CM

## 2013-11-17 DIAGNOSIS — D72829 Elevated white blood cell count, unspecified: Secondary | ICD-10-CM

## 2013-11-17 DIAGNOSIS — Z23 Encounter for immunization: Secondary | ICD-10-CM

## 2013-11-17 DIAGNOSIS — I10 Essential (primary) hypertension: Secondary | ICD-10-CM

## 2013-11-17 LAB — CBC WITH DIFFERENTIAL/PLATELET
Basophils Absolute: 0.1 10*3/uL (ref 0.0–0.1)
Basophils Relative: 0.4 % (ref 0.0–3.0)
Eosinophils Absolute: 0.2 10*3/uL (ref 0.0–0.7)
Eosinophils Relative: 1.9 % (ref 0.0–5.0)
HCT: 47.4 % (ref 39.0–52.0)
Hemoglobin: 15.7 g/dL (ref 13.0–17.0)
Lymphocytes Relative: 31.7 % (ref 12.0–46.0)
Lymphs Abs: 4 10*3/uL (ref 0.7–4.0)
MCHC: 33.2 g/dL (ref 30.0–36.0)
MCV: 92 fl (ref 78.0–100.0)
Monocytes Absolute: 0.9 10*3/uL (ref 0.1–1.0)
Monocytes Relative: 7.4 % (ref 3.0–12.0)
Neutro Abs: 7.5 10*3/uL (ref 1.4–7.7)
Neutrophils Relative %: 58.6 % (ref 43.0–77.0)
Platelets: 300 10*3/uL (ref 150.0–400.0)
RBC: 5.15 Mil/uL (ref 4.22–5.81)
RDW: 14.7 % — ABNORMAL HIGH (ref 11.5–14.6)
WBC: 12.8 10*3/uL — ABNORMAL HIGH (ref 4.5–10.5)

## 2013-11-17 LAB — HEMOGLOBIN A1C: Hgb A1c MFr Bld: 7.5 % — ABNORMAL HIGH (ref 4.6–6.5)

## 2013-11-17 LAB — COMPREHENSIVE METABOLIC PANEL
ALT: 32 U/L (ref 0–53)
AST: 16 U/L (ref 0–37)
Albumin: 4.3 g/dL (ref 3.5–5.2)
Alkaline Phosphatase: 62 U/L (ref 39–117)
BUN: 26 mg/dL — ABNORMAL HIGH (ref 6–23)
CO2: 32 mEq/L (ref 19–32)
Calcium: 9.8 mg/dL (ref 8.4–10.5)
Chloride: 96 mEq/L (ref 96–112)
Creatinine, Ser: 1.3 mg/dL (ref 0.4–1.5)
GFR: 57.92 mL/min — ABNORMAL LOW (ref >60.00)
Glucose, Bld: 154 mg/dL — ABNORMAL HIGH (ref 70–99)
Potassium: 4.1 mEq/L (ref 3.5–5.1)
Sodium: 137 mEq/L (ref 135–145)
Total Bilirubin: 0.9 mg/dL (ref 0.3–1.2)
Total Protein: 7.3 g/dL (ref 6.0–8.3)

## 2013-11-17 MED ORDER — KETOCONAZOLE 2 % EX SHAM: MEDICATED_SHAMPOO | CUTANEOUS | Status: DC

## 2013-11-17 NOTE — Progress Notes (Signed)
OFFICE NOTE  11/17/2013  CC:  Chief Complaint  Patient presents with  . Follow-up     HPI: Patient is a 58 y.o. Caucasian male who is here for 2 mo f/u DM. Fasting avg 115-120.  2H PP avg 130s.  Home bp monitoring : all normal. Walking regularly, losing wt well. No CP/SOB/palpitations/dizziness.  Feeling well.  Has some intermittent tingling in tips of toes--mainly at night.  No burning.  Cardiology has stopped his amiodarone.  Some rash in hairline, scalp lately, itches a little bit.  Also some itchy bumps on left elbow. Came on after using a tea tree shampoo --Enid Derry.   Pertinent PMH:  Past medical, surgical, social, and family history reviewed and no changes are noted since last office visit.  MEDS:  Outpatient Prescriptions Prior to Visit  Medication Sig Dispense Refill  . aspirin EC 81 MG EC tablet Take 1 tablet (81 mg total) by mouth daily.      Marland Kitchen atorvastatin (LIPITOR) 20 MG tablet Take 1 tablet (20 mg total) by mouth daily at 6 PM.  30 tablet  3  . clopidogrel (PLAVIX) 75 MG tablet Take 1 tablet (75 mg total) by mouth daily with breakfast.  30 tablet  3  . lisinopril (PRINIVIL,ZESTRIL) 5 MG tablet Take 1 tablet (5 mg total) by mouth daily.  30 tablet  3  . metFORMIN (GLUCOPHAGE) 850 MG tablet Take 1 tablet (850 mg total) by mouth 2 (two) times daily with a meal.  180 tablet  0  . metoprolol tartrate (LOPRESSOR) 50 MG tablet Take 1 tablet (50 mg total) by mouth 2 (two) times daily.  60 tablet  4  . Alcohol Swabs PADS Please clean finger prior to checking blood sugar  100 each  12  . amiodarone (PACERONE) 200 MG tablet Take 1 tablet (200 mg total) by mouth daily. For 7 days, then decrease to 200 mg twice a day for 7 days, then decrease to 200 mg daily  30 tablet  0  . glucose blood test strip Use as instructed  100 each  12  . glucose monitoring kit (FREESTYLE) monitoring kit 1 each by Does not apply route as needed for other. Please choose glucometer of your choic   1 each  0  . L-ARGININE PO Take 1 tablet by mouth daily.      Marland Kitchen LANCETS ULTRA THIN 30G MISC 1 Units by Does not apply route 4 (four) times daily. Breakfast, Lunch, Dinner, Bedtime  100 each  3  . traMADol (ULTRAM) 50 MG tablet Take 1 tablet (50 mg total) by mouth every 6 (six) hours as needed.  30 tablet  0   No facility-administered medications prior to visit.    PE: Blood pressure 109/75, pulse 88, temperature 97.8 F (36.6 C), temperature source Temporal, resp. rate 18, height 6' (1.829 m), weight 202 lb (91.627 kg), SpO2 98.00%. Gen: Alert, well appearing.  Patient is oriented to person, place, time, and situation. AFFECT: pleasant, lucid thought and speech. Scalp/hairline with scattered flaky, pinkish dermatitis--no large salmon-colored plaques or flakes. CV: RRR Chest is clear, no wheezing or rales. Normal symmetric air entry throughout both lung fields. No chest wall deformities or tenderness. EXT: no cyanosis.  Trace pretibial and ankle edema bilat, w/out rash. Foot exam - both normal; no swelling, tenderness or skin or vascular lesions. Color and temperature is normal. Sensation is intact. Peripheral pulses are palpable. Toenails are normal.  LABS: none today  IMPRESSION AND PLAN:  1)  DM 2, last A1c poor (recent new dx). Recheck A1c today. Feet exam normal today. Urine microalb/cr today. Pneumovax 23 IM today.  2) Mild leukocytosis in hospital 07/2013: will do f/u CBC today.  3) HTN: The current medical regimen is effective;  continue present plan and medications.  4) Seb derm of scalp: ketoconazole 2% shampoo rx'd today.  5) CAD, s/p CABG 07/2013: doing well.  An After Visit Summary was printed and given to the patient.  FOLLOW UP: 4 mo

## 2013-11-17 NOTE — Progress Notes (Signed)
Pre visit review using our clinic review tool, if applicable. No additional management support is needed unless otherwise documented below in the visit note. 

## 2013-11-20 ENCOUNTER — Encounter: Payer: Self-pay | Admitting: Family Medicine

## 2013-11-24 ENCOUNTER — Other Ambulatory Visit: Payer: Self-pay

## 2013-12-05 ENCOUNTER — Telehealth: Payer: Self-pay

## 2013-12-05 NOTE — Telephone Encounter (Signed)
Relevant patient education assigned to patient using Emmi. ° °

## 2013-12-08 ENCOUNTER — Ambulatory Visit (INDEPENDENT_AMBULATORY_CARE_PROVIDER_SITE_OTHER): Payer: BC Managed Care – PPO | Admitting: Cardiovascular Disease

## 2013-12-08 ENCOUNTER — Other Ambulatory Visit (INDEPENDENT_AMBULATORY_CARE_PROVIDER_SITE_OTHER): Payer: BC Managed Care – PPO

## 2013-12-08 ENCOUNTER — Encounter: Payer: Self-pay | Admitting: Cardiovascular Disease

## 2013-12-08 VITALS — BP 111/76 | HR 66 | Ht 72.0 in | Wt 204.6 lb

## 2013-12-08 DIAGNOSIS — E785 Hyperlipidemia, unspecified: Secondary | ICD-10-CM

## 2013-12-08 DIAGNOSIS — E119 Type 2 diabetes mellitus without complications: Secondary | ICD-10-CM

## 2013-12-08 DIAGNOSIS — I2510 Atherosclerotic heart disease of native coronary artery without angina pectoris: Secondary | ICD-10-CM

## 2013-12-08 DIAGNOSIS — Z951 Presence of aortocoronary bypass graft: Secondary | ICD-10-CM

## 2013-12-08 DIAGNOSIS — N529 Male erectile dysfunction, unspecified: Secondary | ICD-10-CM

## 2013-12-08 LAB — HEPATIC FUNCTION PANEL
ALT: 21 U/L (ref 0–53)
AST: 14 U/L (ref 0–37)
Albumin: 4.1 g/dL (ref 3.5–5.2)
Alkaline Phosphatase: 44 U/L (ref 39–117)
Bilirubin, Direct: 0.1 mg/dL (ref 0.0–0.3)
Total Bilirubin: 0.8 mg/dL (ref 0.3–1.2)
Total Protein: 7 g/dL (ref 6.0–8.3)

## 2013-12-08 LAB — BASIC METABOLIC PANEL
BUN: 20 mg/dL (ref 6–23)
CO2: 29 mEq/L (ref 19–32)
Calcium: 9.4 mg/dL (ref 8.4–10.5)
Chloride: 103 mEq/L (ref 96–112)
Creatinine, Ser: 1 mg/dL (ref 0.4–1.5)
GFR: 80.25 mL/min (ref >60.00)
Glucose, Bld: 105 mg/dL — ABNORMAL HIGH (ref 70–99)
Potassium: 4 mEq/L (ref 3.5–5.1)
Sodium: 139 mEq/L (ref 135–145)

## 2013-12-08 LAB — LIPID PANEL
Cholesterol: 99 mg/dL (ref 0–200)
HDL: 36.7 mg/dL — ABNORMAL LOW (ref >39.00)
LDL Cholesterol: 46 mg/dL (ref 0–99)
Total CHOL/HDL Ratio: 3
Triglycerides: 82 mg/dL (ref 0.0–149.0)
VLDL: 16.4 mg/dL (ref 0.0–40.0)

## 2013-12-08 MED ORDER — SILDENAFIL CITRATE 100 MG PO TABS: 100.0000 mg | ORAL_TABLET | Freq: Every day | ORAL | Status: DC | PRN

## 2013-12-08 NOTE — Assessment & Plan Note (Signed)
Alex Yang is doing well. He's not having episodes of chest pain or shortness breath. He had bypass surgery approximately 5 months ago. We'll continue with his same medications. We'll check fasting lipids, liver enzymes, and basic metabolic profile today. I seen again in 6 months for followup visit.  He is having some issues with ED.  Will try Viagra 100 mg as needed.  He does not take NTG.

## 2013-12-08 NOTE — Progress Notes (Signed)
Alex Yang Date of Birth  09-30-1955       [facility] [address], [address], [place], [place]  [address]   [place], [place]  [address] [phone]     [phone]   Fax  [phone]    Fax [phone]  Problem List: 1. Coronary artery disease status post coronary artery bypass grafting ( Dec. 2014)  2. Type 2 diabetes mellitus 3. Hyperlipidemia 4. Atrial fibrillation - converted after being started on amiodarone.  History of Present Illness:  Mr. Alex Yang is a 58 year old gentleman who I have seen in the past. He was admitted on December 12 with a non-ST segment elevation myocardial infarction. He was found to have significant coronary artery disease and had coronary artery bypass grafting.  He developed paroxysmal atrial fibrillation following surgery. He was started on amiodarone. He converted to sinus rhythm and did not require cardioversion.    He was newly diagnosed with diabetes.   He is walking - 25 minutes yesterday.    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    December 08, 2013:  Alex Yang is doing well.  Occasional  episodes of orthostatic hypotension. His glucose levels have been well-controlled.      Current Outpatient Prescriptions on File Prior to Visit  Medication Sig Dispense Refill  . Alcohol Swabs PADS Please clean finger prior to checking blood sugar  100 each  12  . aspirin EC 81 MG EC tablet Take 1 tablet (81 mg total) by mouth daily.      Marland Kitchen atorvastatin (LIPITOR) 20 MG tablet Take 1 tablet (20 mg total) by mouth daily at 6 PM.  30 tablet  3  . clopidogrel (PLAVIX) 75 MG tablet Take 1 tablet (75 mg total) by mouth daily with breakfast.  30 tablet  3  . glucose blood test strip Use as instructed  100 each  12  . glucose monitoring kit (FREESTYLE) monitoring kit 1 each by Does not apply route as needed for other. Please choose glucometer of your choic  1 each  0  . ketoconazole (NIZORAL) 2 %  shampoo Apply to scalp three times per week  120 mL  3  . LANCETS ULTRA THIN 30G MISC 1 Units by Does not apply route 4 (four) times daily. Breakfast, Lunch, Dinner, Bedtime  100 each  3  . lisinopril (PRINIVIL,ZESTRIL) 5 MG tablet Take 1 tablet (5 mg total) by mouth daily.  30 tablet  3  . metFORMIN (GLUCOPHAGE) 850 MG tablet Take 1 tablet (850 mg total) by mouth 2 (two) times daily with a meal.  180 tablet  0  . metoprolol tartrate (LOPRESSOR) 50 MG tablet Take 1 tablet (50 mg total) by mouth 2 (two) times daily.  60 tablet  4   No current facility-administered medications on file prior to visit.    Allergies  Allergen Reactions  . Oxycodone Other (See Comments)    Psych/mental adverse effects    Past Medical History  Diagnosis Date  . NSTEMI (non-ST elevated myocardial infarction) 07/2013  . Diabetes 07/2013  . Hyperlipidemia   . Hypertension   . Paroxysmal atrial fibrillation 07/2013    Started post-op CABG, converted on amiodarone--amiodarone stopped after a couple months  . Chronic renal insufficiency, stage II (mild) 2014/2015    CrCl @ 60 ml/min    Past Surgical History  Procedure Laterality Date  . Shoulder surgery  1983    left; pins are in place  . Nasal fracture surgery  1979  . Coronary artery bypass graft N/A 08/02/2013    Procedure: CORONARY ARTERY BYPASS GRAFTING (CABG);  Surgeon: Grace Isaac, MD;  Location: [facility];  Service: Open Heart Surgery;  Laterality: N/A;  Coronary artery bypass graft times five utilizing the left internal mammary artery and the right greater saphenous vein.  . Intraoperative transesophageal echocardiogram N/A 08/02/2013    Procedure: INTRAOPERATIVE TRANSESOPHAGEAL ECHOCARDIOGRAM;  Surgeon: Grace Isaac, MD;  Location: [facility];  Service: Open Heart Surgery;  Laterality: N/A;.  EF 40%, LVH and LV dilatation, inf wall hypokinesis (at the time of his MI)  . Endovein harvest of greater saphenous vein Right 08/02/2013    Procedure:  ENDOVEIN HARVEST OF GREATER SAPHENOUS VEIN;  Surgeon: Grace Isaac, MD;  Location: [facility];  Service: Open Heart Surgery;  Laterality: Right;    History  Smoking status  . Never Smoker   Smokeless tobacco  . Not on file    History  Alcohol Use No    Family History  Problem Relation Age of Onset  . Heart attack Mother   . Heart disease Mother   . Heart disease Father     Reviw of Systems:  Reviewed in the HPI.  All other systems are negative.  Physical Exam: Blood pressure 111/76, pulse 66, height 6' (1.829 m), weight 204 lb 9.6 oz (92.806 kg). General: Well developed, well nourished, in no acute distress.  Head: Normocephalic, atraumatic, sclera non-icteric, mucus membranes are moist,   Neck: Supple. Carotids are 2 + without bruits. No JVD   Lungs: Clear   Heart: RR, slightly tachycardic  Abdomen: Soft, non-tender, non-distended with normal bowel sounds.  Msk:  Strength and tone are normal   Extremities: No clubbing or cyanosis. No edema.  Distal pedal pulses are 2+ and equal .  SVG harvest site is slightly swollen and tender, no drainage.   Neuro: CN II - XII intact.  Alert and oriented X 3.   Psych:  Normal   ECG:  Assessment / Plan:

## 2013-12-08 NOTE — Patient Instructions (Signed)
Your physician recommends that you continue on your current medications as directed. Please refer to the Current Medication list given to you today.  Your physician recommends that you return for a FASTING lipid profile, Bmet, and Hepatic on 06/09/14.  Your physician wants you to follow-up in: 6 months with Dr. Acie Fredrickson. You will receive a reminder letter in the mail two months in advance. If you don't receive a letter, please call our office to schedule the follow-up appointment.

## 2013-12-09 DIAGNOSIS — Z0271 Encounter for disability determination: Secondary | ICD-10-CM

## 2014-01-03 ENCOUNTER — Other Ambulatory Visit: Payer: Self-pay | Admitting: Family Medicine

## 2014-01-03 ENCOUNTER — Other Ambulatory Visit: Payer: Self-pay | Admitting: Cardiovascular Disease

## 2014-02-01 ENCOUNTER — Other Ambulatory Visit: Payer: Self-pay | Admitting: Cardiovascular Disease

## 2014-02-24 ENCOUNTER — Telehealth: Payer: Self-pay | Admitting: Family Medicine

## 2014-02-24 NOTE — Telephone Encounter (Signed)
Patient is having trouble sleeping at night and would like to know what he can try OTC

## 2014-02-24 NOTE — Telephone Encounter (Signed)
Per [place], Pt can try OTC tylenol PM, Unisom, or melatonin 3mg .  Patient aware.  I advised him if nothing helped he should schedule appointment w/ Dr. Anitra Lauth to discuss his options.

## 2014-03-08 ENCOUNTER — Encounter: Payer: Self-pay | Admitting: Family Medicine

## 2014-03-08 ENCOUNTER — Ambulatory Visit (INDEPENDENT_AMBULATORY_CARE_PROVIDER_SITE_OTHER): Payer: BC Managed Care – PPO | Admitting: Family Medicine

## 2014-03-08 VITALS — BP 115/73 | HR 72 | Temp 97.8°F | Resp 18 | Ht 72.0 in | Wt 213.0 lb

## 2014-03-08 DIAGNOSIS — IMO0002 Reserved for concepts with insufficient information to code with codable children: Secondary | ICD-10-CM

## 2014-03-08 DIAGNOSIS — E1165 Type 2 diabetes mellitus with hyperglycemia: Secondary | ICD-10-CM

## 2014-03-08 DIAGNOSIS — IMO0001 Reserved for inherently not codable concepts without codable children: Secondary | ICD-10-CM

## 2014-03-08 DIAGNOSIS — I2510 Atherosclerotic heart disease of native coronary artery without angina pectoris: Secondary | ICD-10-CM

## 2014-03-08 DIAGNOSIS — G4700 Insomnia, unspecified: Secondary | ICD-10-CM

## 2014-03-08 DIAGNOSIS — L089 Local infection of the skin and subcutaneous tissue, unspecified: Secondary | ICD-10-CM

## 2014-03-08 DIAGNOSIS — Z0000 Encounter for general adult medical examination without abnormal findings: Secondary | ICD-10-CM

## 2014-03-08 DIAGNOSIS — Z23 Encounter for immunization: Secondary | ICD-10-CM

## 2014-03-08 DIAGNOSIS — E118 Type 2 diabetes mellitus with unspecified complications: Secondary | ICD-10-CM

## 2014-03-08 DIAGNOSIS — N529 Male erectile dysfunction, unspecified: Secondary | ICD-10-CM

## 2014-03-08 LAB — HEMOGLOBIN A1C: Hgb A1c MFr Bld: 6.7 % — ABNORMAL HIGH (ref 4.6–6.5)

## 2014-03-08 LAB — MICROALBUMIN / CREATININE URINE RATIO
Creatinine,U: 157.6 mg/dL
Microalb Creat Ratio: 0.9 mg/g (ref 0.0–30.0)
Microalb, Ur: 1.4 mg/dL (ref 0.0–1.9)

## 2014-03-08 MED ORDER — MUPIROCIN 2 % EX OINT: TOPICAL_OINTMENT | CUTANEOUS | Status: DC

## 2014-03-08 MED ORDER — ZOLPIDEM TARTRATE 10 MG PO TABS: 10.0000 mg | ORAL_TABLET | Freq: Every evening | ORAL | Status: DC | PRN

## 2014-03-08 MED ORDER — TADALAFIL 5 MG PO TABS: 5.0000 mg | ORAL_TABLET | Freq: Every day | ORAL | Status: DC | PRN

## 2014-03-08 NOTE — Assessment & Plan Note (Signed)
Viagra not helpful. He wants trial of cialis 5mg  qd so I rx'd this for him today.

## 2014-03-08 NOTE — Assessment & Plan Note (Signed)
Control greatly improved since initial dx last year. Recheck HbA1c today.  Also check urine microalb/cr.

## 2014-03-08 NOTE — Assessment & Plan Note (Signed)
S/p CABG. Asymptomatic. Continue ASA, beta blocker, ACE-I, statin. Has cardiology f/u 05/2014.

## 2014-03-08 NOTE — Progress Notes (Signed)
OFFICE NOTE  03/08/2014  CC:  Chief Complaint  Patient presents with  . Follow-up  . Insomnia     HPI: Patient is a 58 y.o. Caucasian male who is here for 4 mo f/u DM 2. Reports good compliance with all meds and with diet. Typical fasting 113, typical PP 135.  Describes some recent probs initiating sleep x 1 mo.  No signif RLS (feels like he had one night of this feeling only. Has tendency to sleep in daytime after poor sleep nights.  Recent bites on RLQ abd, "looked like little pimples" and he scratched them a lot and they are pink and scabbed-over now and no longer itch.  Viagra no help, he requests cialis 31m qd 30 d trial today--has coupon.  Pertinent PMH:  Past medical, surgical, social, and family history reviewed and no changes are noted since last office visit.  MEDS:  Outpatient Prescriptions Prior to Visit  Medication Sig Dispense Refill  . Alcohol Swabs PADS Please clean finger prior to checking blood sugar  100 each  12  . aspirin EC 81 MG EC tablet Take 1 tablet (81 mg total) by mouth daily.      .Marland Kitchenatorvastatin (LIPITOR) 20 MG tablet TAKE 1 TABLET BY MOUTH ONCE A DAY AT 6PM  30 tablet  2  . clopidogrel (PLAVIX) 75 MG tablet TAKE ONE TABLET BY MOUTH DAILY WITH BREAKFAST  30 tablet  2  . glucose blood test strip Use as instructed  100 each  12  . glucose monitoring kit (FREESTYLE) monitoring kit 1 each by Does not apply route as needed for other. Please choose glucometer of your choic  1 each  0  . ketoconazole (NIZORAL) 2 % shampoo Apply to scalp three times per week  120 mL  3  . LANCETS ULTRA THIN 30G MISC 1 Units by Does not apply route 4 (four) times daily. Breakfast, Lunch, Dinner, Bedtime  100 each  3  . lisinopril (PRINIVIL,ZESTRIL) 5 MG tablet TAKE ONE TABLET BY MOUTH ONE TIME DAILY  30 tablet  2  . metFORMIN (GLUCOPHAGE) 850 MG tablet TAKE ONE TABLET BY MOUTH TWICE A DAY WITH MEALS  180 tablet  1  . metoprolol (LOPRESSOR) 50 MG tablet TAKE ONE TABLET BY  MOUTH TWICE DAILY  60 tablet  3  . sildenafil (VIAGRA) 100 MG tablet Take 1 tablet (100 mg total) by mouth daily as needed for erectile dysfunction.  15 tablet  6   No facility-administered medications prior to visit.    PE: Blood pressure 115/73, pulse 72, temperature 97.8 F (36.6 C), temperature source Temporal, resp. rate 18, height 6' (1.829 m), weight 213 lb (96.616 kg), SpO2 98.00%. Gen: Alert, well appearing.  Patient is oriented to person, place, time, and situation. AFFECT: pleasant, lucid thought and speech. Right lower quad abd: 6-8 1 cm oval scabs with pink around them.  No tenderness or fluctuance or drainage.  IMPRESSION AND PLAN:  Type II or unspecified type diabetes mellitus with unspecified complication, uncontrolled Control greatly improved since initial dx last year. Recheck HbA1c today.  Also check urine microalb/cr.  Insomnia Reviewed the importance of good/correct sleep hygiene.   He expressed understanding and will work on these. Also gave zolpidem 129m 1 tab qhs prn, #15, RF x 1.    Local skin infection Question of infected insect bites. They look to be healing pretty well but will have him apply bactroban ointment bid x 10d to these lesions in RLQ abd. No  sign of abscess or cellulitis.  Erectile dysfunction Viagra not helpful. He wants trial of cialis 71m qd so I rx'd this for him today.  Preventative health care Tdap today.  CAD (coronary artery disease) S/p CABG. Asymptomatic. Continue ASA, beta blocker, ACE-I, statin. Has cardiology f/u 05/2014.   An After Visit Summary was printed and given to the patient.  FOLLOW UP: 452mo

## 2014-03-08 NOTE — Assessment & Plan Note (Signed)
Reviewed the importance of good/correct sleep hygiene.   He expressed understanding and will work on these. Also gave zolpidem 10mg , 1 tab qhs prn, #15, RF x 1.

## 2014-03-08 NOTE — Assessment & Plan Note (Signed)
Tdap today

## 2014-03-08 NOTE — Progress Notes (Signed)
Pre visit review using our clinic review tool, if applicable. No additional management support is needed unless otherwise documented below in the visit note. 

## 2014-03-08 NOTE — Assessment & Plan Note (Signed)
Question of infected insect bites. They look to be healing pretty well but will have him apply bactroban ointment bid x 10d to these lesions in RLQ abd. No sign of abscess or cellulitis.

## 2014-03-20 ENCOUNTER — Ambulatory Visit: Payer: BC Managed Care – PPO | Admitting: Family Medicine

## 2014-04-02 ENCOUNTER — Other Ambulatory Visit: Payer: Self-pay | Admitting: Cardiovascular Disease

## 2014-04-11 ENCOUNTER — Other Ambulatory Visit: Payer: Self-pay | Admitting: Family Medicine

## 2014-04-11 NOTE — Telephone Encounter (Signed)
Rf request for [facility] [facility].  Pt last seen 03/08/14.  Last rx printed 03/08/14 # 15 x 1 rf.  Please advise rf.

## 2014-04-12 NOTE — Telephone Encounter (Signed)
Ambien rx printed

## 2014-05-15 ENCOUNTER — Telehealth: Payer: Self-pay

## 2014-05-15 NOTE — Telephone Encounter (Signed)
PA request initiated for cialis 5 mg, approval pending

## 2014-06-01 ENCOUNTER — Other Ambulatory Visit: Payer: Self-pay

## 2014-06-01 ENCOUNTER — Other Ambulatory Visit: Payer: Self-pay | Admitting: *Deleted

## 2014-06-01 MED ORDER — ATORVASTATIN CALCIUM 20 MG PO TABS
ORAL_TABLET | ORAL | Status: DC
Start: 2014-06-01 — End: 2014-07-12

## 2014-06-01 MED ORDER — METOPROLOL TARTRATE 50 MG PO TABS: ORAL_TABLET | ORAL | Status: DC

## 2014-06-01 MED ORDER — LISINOPRIL 5 MG PO TABS: ORAL_TABLET | ORAL | Status: DC

## 2014-06-01 MED ORDER — CLOPIDOGREL BISULFATE 75 MG PO TABS: ORAL_TABLET | ORAL | Status: DC

## 2014-06-09 ENCOUNTER — Other Ambulatory Visit: Payer: BC Managed Care – PPO

## 2014-06-12 ENCOUNTER — Other Ambulatory Visit: Payer: BC Managed Care – PPO

## 2014-06-26 ENCOUNTER — Ambulatory Visit (INDEPENDENT_AMBULATORY_CARE_PROVIDER_SITE_OTHER): Payer: BC Managed Care – PPO | Admitting: Cardiovascular Disease

## 2014-06-26 ENCOUNTER — Encounter: Payer: Self-pay | Admitting: Cardiovascular Disease

## 2014-06-26 ENCOUNTER — Other Ambulatory Visit (INDEPENDENT_AMBULATORY_CARE_PROVIDER_SITE_OTHER): Payer: BC Managed Care – PPO | Admitting: *Deleted

## 2014-06-26 VITALS — BP 130/90 | HR 71 | Ht 72.0 in | Wt 230.8 lb

## 2014-06-26 DIAGNOSIS — I480 Paroxysmal atrial fibrillation: Secondary | ICD-10-CM

## 2014-06-26 DIAGNOSIS — I10 Essential (primary) hypertension: Secondary | ICD-10-CM

## 2014-06-26 DIAGNOSIS — I2510 Atherosclerotic heart disease of native coronary artery without angina pectoris: Secondary | ICD-10-CM

## 2014-06-26 DIAGNOSIS — E785 Hyperlipidemia, unspecified: Secondary | ICD-10-CM

## 2014-06-26 NOTE — Assessment & Plan Note (Signed)
He had post op A-fib. Rhythm has remained stable.

## 2014-06-26 NOTE — Assessment & Plan Note (Signed)
No angina. Continue same meds.  Will check fasting labs today.

## 2014-06-26 NOTE — Patient Instructions (Signed)
Your physician recommends that you continue on your current medications as directed. Please refer to the Current Medication list given to you today.  Your physician recommends that you have lab work:  TODAY - cholesterol, liver, basic metabolic panel  Your physician wants you to follow-up in: 6 months with Dr. Nahser.  You will receive a reminder letter in the mail two months in advance. If you don't receive a letter, please call our office to schedule the follow-up appointment.  

## 2014-06-26 NOTE — Progress Notes (Signed)
Alex Yang Date of Birth  1956-02-17       [facility] [address], [place], [place]  [address]   [place], [place]  [address] [phone]     [phone]   Fax  [phone]    Fax [phone]  Problem List: 1. Coronary artery disease status post coronary artery bypass grafting ( Dec. 2014)  2. Type 2 diabetes mellitus 3. Hyperlipidemia 4. Atrial fibrillation - converted after being started on amiodarone.  History of Present Illness:  Alex Yang is a 58 year old gentleman who I have seen in the past. He was admitted on December 12 with a non-ST segment elevation myocardial infarction. He was found to have significant coronary artery disease and had coronary artery bypass grafting.  He developed paroxysmal atrial fibrillation following surgery. He was started on amiodarone. He converted to sinus rhythm and did not require cardioversion.    He was newly diagnosed with diabetes.   He is walking - 25 minutes yesterday.    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    December 08, 2013:  Alex Yang is doing well.  Occasional  episodes of orthostatic hypotension. His glucose levels have been well-controlled.     Nov. 9, 2015:  Alex Yang is doing well .  It has been almost one year since his coronary artery bypass grafting. 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     Current Outpatient Prescriptions on File Prior to Visit  Medication Sig Dispense Refill  . Alcohol Swabs PADS Please clean finger prior to checking blood sugar 100 each 12  . aspirin EC 81 MG EC tablet Take 1 tablet (81 mg total) by mouth daily.    Marland Kitchen atorvastatin (LIPITOR) 20 MG tablet TAKE 1 TABLET BY MOUTH ONCE A DAY AT 6PM 30 tablet 2  . clopidogrel (PLAVIX) 75 MG tablet TAKE ONE TABLET BY MOUTH DAILY WITH BREAKFAST 30 tablet 6  . glucose blood test strip Use as instructed 100 each 12  . glucose  monitoring kit (FREESTYLE) monitoring kit 1 each by Does not apply route as needed for other. Please choose glucometer of your choic 1 each 0  . LANCETS ULTRA THIN 30G MISC 1 Units by Does not apply route 4 (four) times daily. Breakfast, Lunch, Dinner, Bedtime 100 each 3  . lisinopril (PRINIVIL,ZESTRIL) 5 MG tablet TAKE ONE TABLET BY MOUTH ONE TIME DAILY 30 tablet 6  . metFORMIN (GLUCOPHAGE) 850 MG tablet TAKE ONE TABLET BY MOUTH TWICE A DAY WITH MEALS 180 tablet 1  . metoprolol (LOPRESSOR) 50 MG tablet TAKE ONE TABLET BY MOUTH TWICE DAILY 60 tablet 3  . tadalafil (CIALIS) 5 MG tablet Take 1 tablet (5 mg total) by mouth daily as needed for erectile dysfunction. 30 tablet 6  . zolpidem (AMBIEN) 10 MG tablet TAKE ONE TABLET BY MOUTH AT BEDTIME AS NEEDED FOR SLEEP 30 tablet 5   No current facility-administered medications on file prior to visit.    Allergies  Allergen Reactions  . Oxycodone Other (See Comments)    Psych/mental adverse effects    Past Medical History  Diagnosis Date  . NSTEMI (non-ST elevated myocardial infarction) 07/2013  . Diabetes 07/2013  . Hyperlipidemia   . Hypertension   . Paroxysmal atrial fibrillation 07/2013    Started post-op CABG, converted on amiodarone--amiodarone stopped after  a couple months  . Chronic renal insufficiency, stage II (mild) 2014/2015    CrCl @ 60 ml/min    Past Surgical History  Procedure Laterality Date  . Shoulder surgery  1983    left; pins are in place  . Nasal fracture surgery  1979  . Coronary artery bypass graft N/A 08/02/2013    Procedure: CORONARY ARTERY BYPASS GRAFTING (CABG);  Surgeon: Grace Isaac, MD;  Location: [facility];  Service: Open Heart Surgery;  Laterality: N/A;  Coronary artery bypass graft times five utilizing the left internal mammary artery and the right greater saphenous vein.  . Intraoperative transesophageal echocardiogram N/A 08/02/2013    Procedure: INTRAOPERATIVE TRANSESOPHAGEAL ECHOCARDIOGRAM;  Surgeon:  Grace Isaac, MD;  Location: [facility];  Service: Open Heart Surgery;  Laterality: N/A;.  EF 40%, LVH and LV dilatation, inf wall hypokinesis (at the time of his MI)  . Endovein harvest of greater saphenous vein Right 08/02/2013    Procedure: ENDOVEIN HARVEST OF GREATER SAPHENOUS VEIN;  Surgeon: Grace Isaac, MD;  Location: [facility];  Service: Open Heart Surgery;  Laterality: Right;    History  Smoking status  . Never Smoker   Smokeless tobacco  . Not on file    History  Alcohol Use No    Family History  Problem Relation Age of Onset  . Heart attack Mother   . Heart disease Mother   . Heart disease Father     Reviw of Systems:  Reviewed in the HPI.  All other systems are negative.  Physical Exam: Blood pressure 130/90, pulse 71, height 6' (1.829 m), weight 230 lb 12.8 oz (104.69 kg). General: Well developed, well nourished, in no acute distress.  Head: Normocephalic, atraumatic, sclera non-icteric, mucus membranes are moist,   Neck: Supple. Carotids are 2 + without bruits. No JVD   Lungs: Clear   Heart: RR,   Sternum is healing well.   Abdomen: Soft, non-tender, non-distended with normal bowel sounds.  Msk:  Strength and tone are normal   Extremities: No clubbing or cyanosis. No edema.  Distal pedal pulses are 2+ and equal .     Neuro: CN II - XII intact.  Alert and oriented X 3.   Psych:  Normal   ECG:  Assessment / Plan:

## 2014-06-26 NOTE — Assessment & Plan Note (Signed)
Diastolic BP is a bit high. He has been eating salty snacks. Ive advised him to avoid the salty snacks and his BP will likely improve.

## 2014-06-27 LAB — LIPID PANEL
Cholesterol: 113 mg/dL (ref 0–200)
HDL: 37.6 mg/dL — ABNORMAL LOW (ref >39.00)
LDL Cholesterol: 51 mg/dL (ref 0–99)
NonHDL: 75.4
Total CHOL/HDL Ratio: 3
Triglycerides: 122 mg/dL (ref 0.0–149.0)
VLDL: 24.4 mg/dL (ref 0.0–40.0)

## 2014-06-27 LAB — HEPATIC FUNCTION PANEL
ALT: 34 U/L (ref 0–53)
AST: 21 U/L (ref 0–37)
Albumin: 3.6 g/dL (ref 3.5–5.2)
Alkaline Phosphatase: 56 U/L (ref 39–117)
Bilirubin, Direct: 0.1 mg/dL (ref 0.0–0.3)
Total Bilirubin: 0.6 mg/dL (ref 0.2–1.2)
Total Protein: 6.9 g/dL (ref 6.0–8.3)

## 2014-06-27 LAB — BASIC METABOLIC PANEL
BUN: 24 mg/dL — ABNORMAL HIGH (ref 6–23)
CO2: 20 mEq/L (ref 19–32)
Calcium: 9.2 mg/dL (ref 8.4–10.5)
Chloride: 105 mEq/L (ref 96–112)
Creatinine, Ser: 1.1 mg/dL (ref 0.4–1.5)
GFR: 70.37 mL/min (ref >60.00)
Glucose, Bld: 161 mg/dL — ABNORMAL HIGH (ref 70–99)
Potassium: 4.5 mEq/L (ref 3.5–5.1)
Sodium: 138 mEq/L (ref 135–145)

## 2014-06-29 ENCOUNTER — Other Ambulatory Visit: Payer: Self-pay | Admitting: Family Medicine

## 2014-07-12 ENCOUNTER — Encounter: Payer: Self-pay | Admitting: Family Medicine

## 2014-07-12 ENCOUNTER — Ambulatory Visit (INDEPENDENT_AMBULATORY_CARE_PROVIDER_SITE_OTHER): Payer: BC Managed Care – PPO | Admitting: Family Medicine

## 2014-07-12 VITALS — BP 126/85 | HR 65 | Temp 97.4°F | Resp 18 | Ht 72.0 in | Wt 223.0 lb

## 2014-07-12 DIAGNOSIS — E119 Type 2 diabetes mellitus without complications: Secondary | ICD-10-CM

## 2014-07-12 DIAGNOSIS — Z23 Encounter for immunization: Secondary | ICD-10-CM

## 2014-07-12 DIAGNOSIS — I10 Essential (primary) hypertension: Secondary | ICD-10-CM

## 2014-07-12 DIAGNOSIS — I2510 Atherosclerotic heart disease of native coronary artery without angina pectoris: Secondary | ICD-10-CM

## 2014-07-12 MED ORDER — ATORVASTATIN CALCIUM 20 MG PO TABS: ORAL_TABLET | ORAL | Status: DC

## 2014-07-12 MED ORDER — TADALAFIL 5 MG PO TABS: 5.0000 mg | ORAL_TABLET | Freq: Every day | ORAL | Status: DC | PRN

## 2014-07-12 NOTE — Progress Notes (Signed)
OFFICE NOTE  07/12/2014  CC:  Chief Complaint  Patient presents with  . Follow-up   HPI: Patient is a 58 y.o. Caucasian male who is here for 4 mo f/u HTN, Hyperlipidemia, CAD, DM 2, CRI stage 2. Home glucose checks qod: avg 130s, occ spikes up to 190s but admits to dietary indiscretions, not as strict with diet in last 4 mo.  Using stationary exercise bike more lately and although wt is up 10 lbs since last visit he says he has lost 4-5 lbs in the last couple of weeks. Compliant with all meds. 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  Recent f/u with cardiologist was good, labs good, no changes.  Since last visit has been having constant feeling of toes numb.  Some dysesthesia in toes as well.   He has not noted any color change.  ED: says cialis 8m qd helps very well, asks for RF.  No side effects.  ROS: no dizziness, no CP, no SOB, no nausea, no LE swelling, no arm pain, no HAs.  Pertinent PMH:  Past medical, surgical, social, and family history reviewed and no changes are noted since last office visit.  MEDS:  Outpatient Prescriptions Prior to Visit  Medication Sig Dispense Refill  . aspirin EC 81 MG EC tablet Take 1 tablet (81 mg total) by mouth daily.    .Marland Kitchenatorvastatin (LIPITOR) 20 MG tablet TAKE 1 TABLET BY MOUTH ONCE A DAY AT 6PM 30 tablet 2  . clopidogrel (PLAVIX) 75 MG tablet TAKE ONE TABLET BY MOUTH DAILY WITH BREAKFAST 30 tablet 6  . lisinopril (PRINIVIL,ZESTRIL) 5 MG tablet TAKE ONE TABLET BY MOUTH ONE TIME DAILY 30 tablet 6  . metFORMIN (GLUCOPHAGE) 850 MG tablet TAKE ONE TABLET BY MOUTH TWICE A DAY WITH MEALS 180 tablet 0  . metoprolol (LOPRESSOR) 50 MG tablet TAKE ONE TABLET BY MOUTH TWICE DAILY 60 tablet 3  . zolpidem (AMBIEN) 10 MG tablet TAKE ONE TABLET BY MOUTH AT BEDTIME AS NEEDED FOR SLEEP 30 tablet 5  . Alcohol Swabs PADS Please clean finger prior to checking blood sugar 100 each 12  . glucose blood test strip Use as instructed 100 each 12   . glucose monitoring kit (FREESTYLE) monitoring kit 1 each by Does not apply route as needed for other. Please choose glucometer of your choic 1 each 0  . LANCETS ULTRA THIN 30G MISC 1 Units by Does not apply route 4 (four) times daily. Breakfast, Lunch, Dinner, Bedtime 100 each 3  . tadalafil (CIALIS) 5 MG tablet Take 1 tablet (5 mg total) by mouth daily as needed for erectile dysfunction. 30 tablet 6   No facility-administered medications prior to visit.    PE: Blood pressure 126/85, pulse 65, temperature 97.4 F (36.3 C), temperature source Temporal, resp. rate 18, height 6' (1.829 m), weight 223 lb (101.152 kg), SpO2 99 %. Gen: Alert, well appearing.  Patient is oriented to person, place, time, and situation. Feet: color is pink, skin is warm and intact, sensation intact grossly to touch.  Toenails normal.  LABS: none today Recent: Lab Results  Component Value Date   HGBA1C 6.7* 03/08/2014     Chemistry      Component Value Date/Time   NA 138 06/26/2014 1013   K 4.5 06/26/2014 1013   CL 105 06/26/2014 1013   CO2 20 06/26/2014 1013   BUN 24* 06/26/2014 1013   CREATININE 1.1 06/26/2014 1013      Component Value  Date/Time   CALCIUM 9.2 06/26/2014 1013   ALKPHOS 56 06/26/2014 1013   AST 21 06/26/2014 1013   ALT 34 06/26/2014 1013   BILITOT 0.6 06/26/2014 1013     Lab Results  Component Value Date   CHOL 113 06/26/2014   HDL 37.60* 06/26/2014   LDLCALC 51 06/26/2014   TRIG 122.0 06/26/2014   CHOLHDL 3 06/26/2014   Lab Results  Component Value Date   WBC 12.8* 11/17/2013   HGB 15.7 11/17/2013   HCT 47.4 11/17/2013   MCV 92.0 11/17/2013   PLT 300.0 11/17/2013   Lab Results  Component Value Date   TSH 2.457 07/30/2013    IMPRESSION AND PLAN:  1) DM 2, recent worsening control per home monitoring. 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 Check HbA1c today. If A1c up, will see if additional med and improved gluc control alone helps DPN  sx's. If A1c ok, will start neurontin.  2) HTN; The current medical regimen is effective;  continue present plan and medications.  3) Hyperlipidemia;The current medical regimen is effective;  continue present plan and medications. Tolerating statin.  4) CAD with hx of MI, now s/p CABG: asymptomatic/stable.  Continue ASA, plavix, statin, beta blocker, ACE-I. Keep routine cardiologist f/u.  5) Erectile dysfunction: doing well with cialis 48m qd.  Refilled rx today.  Flu vaccine IM today.  FOLLOW UP: 470mo

## 2014-07-12 NOTE — Progress Notes (Signed)
Pre visit review using our clinic review tool, if applicable. No additional management support is needed unless otherwise documented below in the visit note. 

## 2014-07-13 LAB — HEMOGLOBIN A1C
Hgb A1c MFr Bld: 7.3 % — ABNORMAL HIGH (ref <5.7)
Mean Plasma Glucose: 163 mg/dL — ABNORMAL HIGH (ref <117)

## 2014-07-17 ENCOUNTER — Other Ambulatory Visit: Payer: Self-pay | Admitting: Family Medicine

## 2014-07-17 MED ORDER — METFORMIN HCL 1000 MG PO TABS: 1000.0000 mg | ORAL_TABLET | Freq: Two times a day (BID) | ORAL | Status: DC

## 2014-07-19 ENCOUNTER — Telehealth: Payer: Self-pay | Admitting: Family Medicine

## 2014-07-19 NOTE — Telephone Encounter (Signed)
emmi emailed °

## 2014-07-27 ENCOUNTER — Encounter (HOSPITAL_COMMUNITY): Payer: Self-pay | Admitting: Cardiovascular Disease

## 2014-08-16 ENCOUNTER — Telehealth: Payer: Self-pay | Admitting: Family Medicine

## 2014-08-16 NOTE — Telephone Encounter (Signed)
Patient states since increasing metformin dose his glucose has been really out of wack.  Some recent sugars are 195, 174, 185, 232, 211, 164.  Patient states that he has been eating better and exercising.   He doesn't have any symptoms of high BS.  Do you think he should invest in a new meter or what do you advise?

## 2014-08-16 NOTE — Telephone Encounter (Signed)
Per Dr. Anitra Lauth,  It is usually not the meter.  Pt is to test sugar and then one minute later test again, results should not be more than 5 digits off.  If the result is more than 5 digits off, then patient should first try new strips.  Left detailed message on patient's phone.  Okay per DPR.

## 2014-08-28 ENCOUNTER — Other Ambulatory Visit: Payer: Self-pay | Admitting: Cardiovascular Disease

## 2014-09-01 ENCOUNTER — Other Ambulatory Visit: Payer: Self-pay | Admitting: Cardiovascular Disease

## 2014-09-27 ENCOUNTER — Other Ambulatory Visit: Payer: Self-pay | Admitting: Cardiovascular Disease

## 2014-10-03 ENCOUNTER — Other Ambulatory Visit: Payer: Self-pay | Admitting: Family Medicine

## 2014-10-03 NOTE — Telephone Encounter (Signed)
RF request for ambien.  Last OV was 07/12/14.  Last RX printed 04/12/14 x 5rfs.  Please advise.

## 2014-11-06 ENCOUNTER — Ambulatory Visit: Payer: Self-pay | Admitting: Family Medicine

## 2014-11-10 ENCOUNTER — Ambulatory Visit: Payer: BC Managed Care – PPO | Admitting: Family Medicine

## 2014-11-18 ENCOUNTER — Other Ambulatory Visit: Payer: Self-pay | Admitting: Family Medicine

## 2014-11-20 ENCOUNTER — Encounter: Payer: Self-pay | Admitting: Family Medicine

## 2014-11-20 ENCOUNTER — Ambulatory Visit (INDEPENDENT_AMBULATORY_CARE_PROVIDER_SITE_OTHER): Payer: 59 | Admitting: Family Medicine

## 2014-11-20 VITALS — BP 130/80 | HR 76 | Temp 98.6°F | Ht 72.0 in | Wt 224.0 lb

## 2014-11-20 DIAGNOSIS — E118 Type 2 diabetes mellitus with unspecified complications: Secondary | ICD-10-CM | POA: Diagnosis not present

## 2014-11-20 DIAGNOSIS — N5201 Erectile dysfunction due to arterial insufficiency: Secondary | ICD-10-CM | POA: Diagnosis not present

## 2014-11-20 DIAGNOSIS — E785 Hyperlipidemia, unspecified: Secondary | ICD-10-CM

## 2014-11-20 DIAGNOSIS — G4700 Insomnia, unspecified: Secondary | ICD-10-CM

## 2014-11-20 DIAGNOSIS — I10 Essential (primary) hypertension: Secondary | ICD-10-CM

## 2014-11-20 DIAGNOSIS — Z114 Encounter for screening for human immunodeficiency virus [HIV]: Secondary | ICD-10-CM | POA: Diagnosis not present

## 2014-11-20 DIAGNOSIS — Z951 Presence of aortocoronary bypass graft: Secondary | ICD-10-CM

## 2014-11-20 LAB — HEMOGLOBIN A1C: Hgb A1c MFr Bld: 7.6 % — ABNORMAL HIGH (ref 4.6–6.5)

## 2014-11-20 MED ORDER — ZOLPIDEM TARTRATE 10 MG PO TABS: 10.0000 mg | ORAL_TABLET | Freq: Every evening | ORAL | Status: DC | PRN

## 2014-11-20 NOTE — Progress Notes (Signed)
OFFICE NOTE  11/20/2014  CC:  Chief Complaint  Patient presents with  . Follow-up    HPI: Patient is a 59 y.o. Caucasian male who is here for 5 mo f/u DM 2, hyperlipidemia, CAD, HTN, CRI stage 2.  Home gluc monitoring: avg fasting 130s.  Avg 2H PP 140s. No hypoglycemia.  Eating diabetic diet well.  Home bp monitoring: usually 120s/70s, avg 70s. He is compliant with his cholesterol med, no side effects.  Feet: balls of feet and toes sting and feel a bit numb/tingly.  No hx of feet ulcers.  Next cardiology f/u should be in 12/2014. Pt specifically requests referral to urologist today due to ED that has been unresponsive to meds. He requests Dr. Tresa Moore at [facility] b/c this is who his wife sees.  Pertinent PMH:  Past medical, surgical, social, and family history reviewed and no changes are noted since last office visit.  MEDS:  Outpatient Prescriptions Prior to Visit  Medication Sig Dispense Refill  . Alcohol Swabs PADS Please clean finger prior to checking blood sugar 100 each 12  . aspirin EC 81 MG EC tablet Take 1 tablet (81 mg total) by mouth daily.    Marland Kitchen atorvastatin (LIPITOR) 20 MG tablet TAKE 1 TABLET BY MOUTH ONCE A DAY AT 6PM 30 tablet 6  . clopidogrel (PLAVIX) 75 MG tablet TAKE ONE TABLET BY MOUTH DAILY WITH BREAKFAST 30 tablet 6  . glucose blood test strip Use as instructed 100 each 12  . glucose monitoring kit (FREESTYLE) monitoring kit 1 each by Does not apply route as needed for other. Please choose glucometer of your choic 1 each 0  . LANCETS ULTRA THIN 30G MISC 1 Units by Does not apply route 4 (four) times daily. Breakfast, Lunch, Dinner, Bedtime 100 each 3  . lisinopril (PRINIVIL,ZESTRIL) 5 MG tablet TAKE ONE TABLET BY MOUTH ONE TIME DAILY 30 tablet 6  . metFORMIN (GLUCOPHAGE) 1000 MG tablet TAKE 1 TABLET (1,000 MG TOTAL) BY MOUTH 2 (TWO) TIMES DAILY WITH A MEA L. 60 tablet 1  . metoprolol (LOPRESSOR) 50 MG tablet TAKE ONE TABLET BY MOUTH TWICE DAILY 60 tablet 3  .  tadalafil (CIALIS) 5 MG tablet Take 1 tablet (5 mg total) by mouth daily as needed for erectile dysfunction. 30 tablet 12  . zolpidem (AMBIEN) 10 MG tablet TAKE ONE TABLET BY MOUTH AT BEDTIME AS NEEDED FOR SLEEP 30 tablet 5   No facility-administered medications prior to visit.    PE: Blood pressure 130/80, pulse 76, temperature 98.6 F (37 C), temperature source Oral, height 6' (1.829 m), weight 224 lb (101.606 kg), SpO2 98 %. Gen: Alert, well appearing.  Patient is oriented to person, place, time, and situation. AFFECT: pleasant, lucid thought and speech. Foot exam - general appearance normal; no swelling, tenderness or skin or vascular lesions. Color and temperature is normal. Sensation is diminished with monofilament testing diffusely on both feet. Peripheral pulses are palpable. Toenails are normal.   LABS:  Lab Results  Component Value Date   HGBA1C 7.3* 07/12/2014    Lab Results  Component Value Date   TSH 2.457 07/30/2013   Lab Results  Component Value Date   WBC 12.8* 11/17/2013   HGB 15.7 11/17/2013   HCT 47.4 11/17/2013   MCV 92.0 11/17/2013   PLT 300.0 11/17/2013   Lab Results  Component Value Date   CREATININE 1.1 06/26/2014   BUN 24* 06/26/2014   NA 138 06/26/2014   K 4.5 06/26/2014   CL  105 06/26/2014   CO2 20 06/26/2014   Lab Results  Component Value Date   ALT 34 06/26/2014   AST 21 06/26/2014   ALKPHOS 56 06/26/2014   BILITOT 0.6 06/26/2014   Lab Results  Component Value Date   CHOL 113 06/26/2014   Lab Results  Component Value Date   HDL 37.60* 06/26/2014   Lab Results  Component Value Date   LDLCALC 51 06/26/2014   Lab Results  Component Value Date   TRIG 122.0 06/26/2014   Lab Results  Component Value Date   CHOLHDL 3 06/26/2014    IMPRESSION AND PLAN:  1) DM 2, peripheral neuropathy (mostly sensory loss, not much pain component at this time. No new meds. Check A1c today.  Eye exam was 2 wks ago and he had no Diab retpthy  per his report. He says he would like ophthalmologist referral when due for next exam in 1 yr rather than continue seeing the optemetrist at [place].  2) HTN; The current medical regimen is effective;  continue present plan and medications.  3) Hyperlipidemia; The current medical regimen is effective;  continue present plan and medications.  4) Erectile dysfunction: he requested a urologist referral for this problem today so I ordered this.  5) Insomnia; does well on nightly ambien.  6) CAD: s/p CABG over a year ago, asymptomatic.  Keep cardiology f/u and continue current meds.  An After Visit Summary was printed and given to the patient.  FOLLOW UP: 4 mo (need to discuss plan for colon cancer screening at some time in near future)

## 2014-11-20 NOTE — Progress Notes (Signed)
Pre visit review using our clinic review tool, if applicable. No additional management support is needed unless otherwise documented below in the visit note. 

## 2014-11-21 LAB — HIV ANTIBODY (ROUTINE TESTING W REFLEX): HIV 1&2 Ab, 4th Generation: NONREACTIVE

## 2014-11-22 ENCOUNTER — Other Ambulatory Visit: Payer: Self-pay | Admitting: *Deleted

## 2014-11-22 MED ORDER — PIOGLITAZONE HCL 15 MG PO TABS: 15.0000 mg | ORAL_TABLET | Freq: Every day | ORAL | Status: DC

## 2014-12-18 ENCOUNTER — Other Ambulatory Visit: Payer: Self-pay | Admitting: Cardiovascular Disease

## 2014-12-26 ENCOUNTER — Telehealth: Payer: Self-pay | Admitting: *Deleted

## 2014-12-26 ENCOUNTER — Other Ambulatory Visit: Payer: Self-pay | Admitting: Family Medicine

## 2014-12-26 NOTE — Telephone Encounter (Signed)
OK. I recommend he continue on his metformin 1000 mg twice a day and we'll see how his next HbA1c is at next routine follow up visit.  No new diabetic meds recommended at this time.-thx

## 2014-12-26 NOTE — Telephone Encounter (Signed)
Pt called stating that he stopped taking the pioglitazone due to swelling all over including his tounge and rash. He wanted to let Dr. Anitra Lauth know. He states that he has been off the pioglitazone for a few weeks and is doing much better. He states that his blood sugars have been averaging around 115-140.

## 2014-12-27 NOTE — Telephone Encounter (Signed)
Left message for pt to call back  °

## 2014-12-27 NOTE — Telephone Encounter (Signed)
Pt advised and voiced understanding.   

## 2015-01-12 ENCOUNTER — Encounter: Payer: Self-pay | Admitting: Family Medicine

## 2015-01-19 ENCOUNTER — Other Ambulatory Visit: Payer: Self-pay | Admitting: Family Medicine

## 2015-01-19 ENCOUNTER — Other Ambulatory Visit: Payer: Self-pay | Admitting: Cardiovascular Disease

## 2015-02-10 ENCOUNTER — Encounter: Payer: Self-pay | Admitting: Family Medicine

## 2015-03-20 ENCOUNTER — Other Ambulatory Visit: Payer: Self-pay | Admitting: Cardiovascular Disease

## 2015-03-22 ENCOUNTER — Encounter: Payer: Self-pay | Admitting: Family Medicine

## 2015-03-22 ENCOUNTER — Ambulatory Visit (INDEPENDENT_AMBULATORY_CARE_PROVIDER_SITE_OTHER): Payer: 59 | Admitting: Family Medicine

## 2015-03-22 VITALS — BP 129/86 | HR 77 | Temp 97.5°F | Resp 16 | Ht 72.0 in | Wt 222.0 lb

## 2015-03-22 DIAGNOSIS — E1142 Type 2 diabetes mellitus with diabetic polyneuropathy: Secondary | ICD-10-CM

## 2015-03-22 DIAGNOSIS — E785 Hyperlipidemia, unspecified: Secondary | ICD-10-CM | POA: Diagnosis not present

## 2015-03-22 DIAGNOSIS — F32A Depression, unspecified: Secondary | ICD-10-CM

## 2015-03-22 DIAGNOSIS — G629 Polyneuropathy, unspecified: Secondary | ICD-10-CM

## 2015-03-22 DIAGNOSIS — E119 Type 2 diabetes mellitus without complications: Secondary | ICD-10-CM | POA: Diagnosis not present

## 2015-03-22 DIAGNOSIS — F411 Generalized anxiety disorder: Secondary | ICD-10-CM

## 2015-03-22 DIAGNOSIS — F329 Major depressive disorder, single episode, unspecified: Secondary | ICD-10-CM

## 2015-03-22 DIAGNOSIS — Z1211 Encounter for screening for malignant neoplasm of colon: Secondary | ICD-10-CM

## 2015-03-22 DIAGNOSIS — I10 Essential (primary) hypertension: Secondary | ICD-10-CM | POA: Diagnosis not present

## 2015-03-22 LAB — MICROALBUMIN / CREATININE URINE RATIO
Creatinine,U: 175.7 mg/dL
Microalb Creat Ratio: 1.1 mg/g (ref 0.0–30.0)
Microalb, Ur: 1.9 mg/dL (ref 0.0–1.9)

## 2015-03-22 LAB — BASIC METABOLIC PANEL
BUN: 30 mg/dL — ABNORMAL HIGH (ref 6–23)
CO2: 26 mEq/L (ref 19–32)
Calcium: 9.6 mg/dL (ref 8.4–10.5)
Chloride: 105 mEq/L (ref 96–112)
Creatinine, Ser: 1.11 mg/dL (ref 0.40–1.50)
GFR: 71.65 mL/min (ref >60.00)
Glucose, Bld: 146 mg/dL — ABNORMAL HIGH (ref 70–99)
Potassium: 4.7 mEq/L (ref 3.5–5.1)
Sodium: 137 mEq/L (ref 135–145)

## 2015-03-22 LAB — HEMOGLOBIN A1C: Hgb A1c MFr Bld: 7.3 % — ABNORMAL HIGH (ref 4.6–6.5)

## 2015-03-22 MED ORDER — GABAPENTIN 300 MG PO CAPS: ORAL_CAPSULE | ORAL | Status: DC

## 2015-03-22 MED ORDER — LORAZEPAM 0.5 MG PO TABS: ORAL_TABLET | ORAL | Status: DC

## 2015-03-22 MED ORDER — CITALOPRAM HYDROBROMIDE 20 MG PO TABS: 20.0000 mg | ORAL_TABLET | Freq: Every day | ORAL | Status: DC

## 2015-03-22 NOTE — Progress Notes (Signed)
Pre visit review using our clinic review tool, if applicable. No additional management support is needed unless otherwise documented below in the visit note. 

## 2015-03-22 NOTE — Progress Notes (Signed)
OFFICE VISIT  03/22/2015   CC:  Chief Complaint  Patient presents with  . Follow-up    4 month f/u. Pt is not fasting.    HPI:    Patient is a 59 y.o. Caucasian male who presents for 4 mo f/u DM 2, HTN, hyperlipidemia, CAD. He is 20 mo s/p CABG now.   Feeling well physically: compliant with meds.  Glucoses "normal".  Denies side effects from meds. No CP/SOB/dizziness/palpitations   Acute c/o: feeling more anxious/stressed lately, has been a long term problem off and on.  Has 3 teenage daughters, has a stressful job.  Some depressed mood, feels overwhelmed at times.  Denies SI or HI. Asks about anxiety med.  He has never been rx'd any anxiety med.    All of his toes feel numb intermittently, some redness on bottoms of feet at times.  Some intermittent pain.  Past Medical History  Diagnosis Date  . NSTEMI (non-ST elevated myocardial infarction) 07/2013    CABG x 22 Jul 2013  . Diabetes 07/2013  . Hyperlipidemia   . Hypertension   . Paroxysmal atrial fibrillation 07/2013    Started post-op CABG, converted on amiodarone--amiodarone stopped after a couple months  . Chronic renal insufficiency, stage II (mild) 2014/2015    CrCl @ 60 ml/min  . Obesity, Class I, BMI 30-34.9   . Erectile dysfunction     Dr. Tresa Moore (urol is also doing prostate ca screening).  Refractory to medications.  Pt likely to get penile implant as of urol f/u 01/2015.    Past Surgical History  Procedure Laterality Date  . Shoulder surgery  1983    left; pins are in place  . Nasal fracture surgery  1979  . Coronary artery bypass graft N/A 08/02/2013    Procedure: CORONARY ARTERY BYPASS GRAFTING (CABG);  Surgeon: Grace Isaac, MD;  Location: [place] [facility];  Service: Open Heart Surgery;  Laterality: N/A;  Coronary artery bypass graft times five utilizing the left internal mammary artery and the right greater saphenous vein.  . Intraoperative transesophageal echocardiogram N/A 08/02/2013    Procedure: INTRAOPERATIVE  TRANSESOPHAGEAL ECHOCARDIOGRAM;  Surgeon: Grace Isaac, MD;  Location: [facility];  Service: Open Heart Surgery;  Laterality: N/A;.  EF 40%, LVH and LV dilatation, inf wall hypokinesis (at the time of his MI)  . Endovein harvest of greater saphenous vein Right 08/02/2013    Procedure: ENDOVEIN HARVEST OF GREATER SAPHENOUS VEIN;  Surgeon: Grace Isaac, MD;  Location: [place];  Service: Open Heart Surgery;  Laterality: Right;  . Left heart catheterization with coronary angiogram N/A 08/01/2013    Procedure: LEFT HEART CATHETERIZATION WITH CORONARY ANGIOGRAM;  Surgeon: Blane Ohara, MD;  Location: [facility] CATH LAB;  Service: Cardiovascular;  Laterality: N/A;    Outpatient Prescriptions Prior to Visit  Medication Sig Dispense Refill  . Alcohol Swabs PADS Please clean finger prior to checking blood sugar 100 each 12  . aspirin EC 81 MG EC tablet Take 1 tablet (81 mg total) by mouth daily.    Marland Kitchen atorvastatin (LIPITOR) 20 MG tablet TAKE 1 TABLET BY MOUTH ONCE A DAY AT 6PM 30 tablet 6  . clopidogrel (PLAVIX) 75 MG tablet TAKE ONE TABLET BY MOUTH DAILY WITH BREAKFAST 30 tablet 0  . glucose blood test strip Use as instructed 100 each 12  . glucose monitoring kit (FREESTYLE) monitoring kit 1 each by Does not apply route as needed for other. Please choose glucometer of your choic 1 each 0  .  LANCETS ULTRA THIN 30G MISC 1 Units by Does not apply route 4 (four) times daily. Breakfast, Lunch, Dinner, Bedtime 100 each 3  . lisinopril (PRINIVIL,ZESTRIL) 5 MG tablet TAKE ONE TABLET BY MOUTH ONE TIME DAILY 30 tablet 0  . metFORMIN (GLUCOPHAGE) 1000 MG tablet TAKE 1 TABLET (1,000 MG TOTAL) BY MOUTH TWICE DAILY WITH A MEAL 60 tablet 3  . metoprolol (LOPRESSOR) 50 MG tablet TAKE ONE TABLET BY MOUTH TWICE DAILY 60 tablet 0  . zolpidem (AMBIEN) 10 MG tablet Take 1 tablet (10 mg total) by mouth at bedtime as needed. for sleep 30 tablet 5  . tadalafil (CIALIS) 5 MG tablet Take 1 tablet (5 mg total) by mouth daily as  needed for erectile dysfunction. (Patient not taking: Reported on 03/22/2015) 30 tablet 12   No facility-administered medications prior to visit.    Allergies  Allergen Reactions  . Oxycodone Other (See Comments)    Psych/mental adverse effects  . Pioglitazone Swelling and Rash    ROS As per HPI  PE: Blood pressure 129/86, pulse 77, temperature 97.5 F (36.4 C), temperature source Oral, resp. rate 16, height 6' (1.829 m), weight 222 lb (100.699 kg), SpO2 97 %. Gen: Alert, well appearing.  Patient is oriented to person, place, time, and situation. AFFECT: pleasant, lucid thought and speech.   LABS:  Lab Results  Component Value Date   HGBA1C 7.6* 11/20/2014   Lab Results  Component Value Date   TSH 2.457 07/30/2013   Lab Results  Component Value Date   WBC 12.8* 11/17/2013   HGB 15.7 11/17/2013   HCT 47.4 11/17/2013   MCV 92.0 11/17/2013   PLT 300.0 11/17/2013   Lab Results  Component Value Date   CREATININE 1.1 06/26/2014   BUN 24* 06/26/2014   NA 138 06/26/2014   K 4.5 06/26/2014   CL 105 06/26/2014   CO2 20 06/26/2014   Lab Results  Component Value Date   ALT 34 06/26/2014   AST 21 06/26/2014   ALKPHOS 56 06/26/2014   BILITOT 0.6 06/26/2014   Lab Results  Component Value Date   CHOL 113 06/26/2014   Lab Results  Component Value Date   HDL 37.60* 06/26/2014   Lab Results  Component Value Date   LDLCALC 51 06/26/2014   Lab Results  Component Value Date   TRIG 122.0 06/26/2014   Lab Results  Component Value Date   CHOLHDL 3 06/26/2014   IMPRESSION AND PLAN:  1) DM 2; stable. HbA1c and urine microalb/cr today. Mild DPN pain/numbness: will start trial of gabapentin 300 mg qhs. Therapeutic expectations and side effect profile of medication discussed today.  Patient's questions answered.  2) HTN; The current medical regimen is effective;  continue present plan and medications. BMET today.  3) Hyperlipidemia: tolerating statin.  Will  recheck FLP at next visit in 3 mo.  4) Colon cancer screening: referred to [place] today for initial screening colonoscopy.  5) GAD/depression: start citalopram 72m qd.  Therapeutic expectations and side effect profile of medication discussed today.  Patient's questions answered. Lorazepam 0.560m 1-2 tid prn, #30, no RF.  6) CAD, s/p CABG: has routine cardiology f/u 03/28/15.  An After Visit Summary was printed and given to the patient.  FOLLOW UP: Return in about 4 weeks (around 04/19/2015) for f/u anxiety/depression.

## 2015-03-23 ENCOUNTER — Encounter: Payer: Self-pay | Admitting: Gastroenterology

## 2015-03-27 ENCOUNTER — Other Ambulatory Visit: Payer: Self-pay | Admitting: Family Medicine

## 2015-03-27 NOTE — Telephone Encounter (Signed)
RF request for atorvastatin LOV: 03/22/15 Next ov: 04/19/15 Last written: 07/12/14 #30 w/ 6RF

## 2015-03-27 NOTE — Progress Notes (Signed)
Alex Yang Date of Birth  05-27-1956       [facility] [address], [address], [place], [place]  [address]   [place], [place]  [address] [phone]     [phone]   Fax  [phone]    Fax [phone]  Problem List: 1. Coronary artery disease status post coronary artery bypass grafting ( Dec. 2014)  2. Type 2 diabetes mellitus 3. Hyperlipidemia 4. Atrial fibrillation - converted after being started on amiodarone.  History of Present Illness:  Mr. Alex Yang is a 59 year old gentleman who I have seen in the past. He was admitted on December 12 with a non-ST segment elevation myocardial infarction. He was found to have significant coronary artery disease and had coronary artery bypass grafting.  He developed paroxysmal atrial fibrillation following surgery. He was started on amiodarone. He converted to sinus rhythm and did not require cardioversion.    He was newly diagnosed with diabetes.   He is walking - 25 minutes yesterday.    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    December 08, 2013:  Alex Yang is doing well.  Occasional  episodes of orthostatic hypotension. His glucose levels have been well-controlled.     Nov. 9, 2015:  Alex Yang is doing well .  It has been almost one year since his coronary artery bypass grafting. 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   Aug. 10, 2016:  He is doing well.  Has some MSK pain in his sternum . glucoose is well controlled Is in a new band - 3 piece  Exercises regularly   Current Outpatient Prescriptions on File Prior to Visit  Medication Sig Dispense Refill  . Alcohol Swabs PADS Please clean finger prior to checking blood sugar 100 each 12  . aspirin EC 81 MG EC tablet Take 1 tablet (81 mg total) by mouth daily.    Marland Kitchen atorvastatin (LIPITOR) 20 MG tablet TAKE 1 TABLET BY MOUTH ONCE A DAY AT 6PM 30 tablet 6  . citalopram  (CELEXA) 20 MG tablet Take 1 tablet (20 mg total) by mouth daily. 30 tablet 1  . clopidogrel (PLAVIX) 75 MG tablet TAKE ONE TABLET BY MOUTH DAILY WITH BREAKFAST 30 tablet 0  . gabapentin (NEURONTIN) 300 MG capsule 1 tab po qhs 30 capsule 3  . glucose blood test strip Use as instructed 100 each 12  . glucose monitoring kit (FREESTYLE) monitoring kit 1 each by Does not apply route as needed for other. Please choose glucometer of your choic 1 each 0  . LANCETS ULTRA THIN 30G MISC 1 Units by Does not apply route 4 (four) times daily. Breakfast, Lunch, Dinner, Bedtime 100 each 3  . lisinopril (PRINIVIL,ZESTRIL) 5 MG tablet TAKE ONE TABLET BY MOUTH ONE TIME DAILY 30 tablet 0  . LORazepam (ATIVAN) 0.5 MG tablet 1-2 tabs po q8h prn anxiety 30 tablet 0  . metFORMIN (GLUCOPHAGE) 1000 MG tablet TAKE 1 TABLET (1,000 MG TOTAL) BY MOUTH TWICE DAILY WITH A MEAL 60 tablet 3  . metoprolol (LOPRESSOR) 50 MG tablet TAKE ONE TABLET BY MOUTH TWICE DAILY 60 tablet 0  . zolpidem (AMBIEN) 10 MG tablet Take 1 tablet (10 mg total) by mouth at bedtime as needed. for sleep 30 tablet 5   No current facility-administered medications on file prior to visit.  Allergies  Allergen Reactions  . Oxycodone Other (See Comments)    Psych/mental adverse effects  . Pioglitazone Swelling and Rash    Past Medical History  Diagnosis Date  . NSTEMI (non-ST elevated myocardial infarction) 07/2013    CABG x 22 Jul 2013  . Diabetes 07/2013  . Hyperlipidemia   . Hypertension   . Paroxysmal atrial fibrillation 07/2013    Started post-op CABG, converted on amiodarone--amiodarone stopped after a couple months  . Chronic renal insufficiency, stage II (mild) 2014/2015    CrCl @ 60 ml/min  . Obesity, Class I, BMI 30-34.9   . Erectile dysfunction     Dr. Tresa Moore (urol is also doing prostate ca screening).  Refractory to medications.  Pt likely to get penile implant as of urol f/u 01/2015.  . Diabetic peripheral neuropathy associated with  type 2 diabetes mellitus     Past Surgical History  Procedure Laterality Date  . Shoulder surgery  1983    left; pins are in place  . Nasal fracture surgery  1979  . Coronary artery bypass graft N/A 08/02/2013    Procedure: CORONARY ARTERY BYPASS GRAFTING (CABG);  Surgeon: Grace Isaac, MD;  Location: [facility];  Service: Open Heart Surgery;  Laterality: N/A;  Coronary artery bypass graft times five utilizing the left internal mammary artery and the right greater saphenous vein.  . Intraoperative transesophageal echocardiogram N/A 08/02/2013    Procedure: INTRAOPERATIVE TRANSESOPHAGEAL ECHOCARDIOGRAM;  Surgeon: Grace Isaac, MD;  Location: [facility];  Service: Open Heart Surgery;  Laterality: N/A;.  EF 40%, LVH and LV dilatation, inf wall hypokinesis (at the time of his MI)  . Endovein harvest of greater saphenous vein Right 08/02/2013    Procedure: ENDOVEIN HARVEST OF GREATER SAPHENOUS VEIN;  Surgeon: Grace Isaac, MD;  Location: [facility];  Service: Open Heart Surgery;  Laterality: Right;  . Left heart catheterization with coronary angiogram N/A 08/01/2013    Procedure: LEFT HEART CATHETERIZATION WITH CORONARY ANGIOGRAM;  Surgeon: Blane Ohara, MD;  Location: [facility] CATH LAB;  Service: Cardiovascular;  Laterality: N/A;    History  Smoking status  . Never Smoker   Smokeless tobacco  . Not on file    History  Alcohol Use No    Family History  Problem Relation Age of Onset  . Heart attack Mother   . Heart disease Mother   . Heart disease Father     Reviw of Systems:  Reviewed in the HPI.  All other systems are negative.  Physical Exam: There were no vitals taken for this visit. General: Well developed, well nourished, in no acute distress.  Head: Normocephalic, atraumatic, sclera non-icteric, mucus membranes are moist,   Neck: Supple. Carotids are 2 + without bruits. No JVD   Lungs: Clear   Heart: RR,   Sternum is healing well.   Abdomen: Soft, non-tender,  non-distended with normal bowel sounds.  Msk:  Strength and tone are normal   Extremities: No clubbing or cyanosis. No edema.  Distal pedal pulses are 2+ and equal .     Neuro: CN II - XII intact.  Alert and oriented X 3.   Psych:  Normal   ECG: 03/27/2005 to: Normal sinus rhythm at 66. Previous inferior wall myocardial infarction. Otherwise the EKG is normal.  Assessment / Plan:   1. Coronary artery disease status post coronary artery bypass grafting ( Dec. 2014)  -  he's doing very well. He's not had any episodes of angina.  2.  Type 2 diabetes mellitus - managed by his primary medical doctor. His last hemoglobin A1c was 7.3. I've encouraged him to work on a better diet and better exercise program   3. Hyperlipidemia - we'll check fasting lipids today.  4. Atrial fibrillation - converted after being started on amiodarone. - He's maintaining normal sinus rhythm. Continue current medications.    Nahser, Wonda Cheng, MD  03/27/2015 1:15 PM    [facility] [place],  [place], [place]  [address] Pager [phone] Phone: [phone]; Fax: [phone]   [facility]  [address] [place], [place]  [address] [phone]   Fax [phone]

## 2015-03-28 ENCOUNTER — Ambulatory Visit (INDEPENDENT_AMBULATORY_CARE_PROVIDER_SITE_OTHER): Payer: 59 | Admitting: Cardiovascular Disease

## 2015-03-28 ENCOUNTER — Encounter: Payer: Self-pay | Admitting: Cardiovascular Disease

## 2015-03-28 VITALS — BP 114/72 | HR 66 | Ht 72.0 in | Wt 220.8 lb

## 2015-03-28 DIAGNOSIS — I2510 Atherosclerotic heart disease of native coronary artery without angina pectoris: Secondary | ICD-10-CM | POA: Diagnosis not present

## 2015-03-28 DIAGNOSIS — I10 Essential (primary) hypertension: Secondary | ICD-10-CM | POA: Diagnosis not present

## 2015-03-28 DIAGNOSIS — E785 Hyperlipidemia, unspecified: Secondary | ICD-10-CM | POA: Diagnosis not present

## 2015-03-28 LAB — LIPID PANEL
Cholesterol: 102 mg/dL (ref 0–200)
HDL: 38.1 mg/dL — ABNORMAL LOW (ref >39.00)
LDL Cholesterol: 38 mg/dL (ref 0–99)
NonHDL: 63.69
Total CHOL/HDL Ratio: 3
Triglycerides: 129 mg/dL (ref 0.0–149.0)
VLDL: 25.8 mg/dL (ref 0.0–40.0)

## 2015-03-28 LAB — HEPATIC FUNCTION PANEL
ALT: 26 U/L (ref 0–53)
AST: 17 U/L (ref 0–37)
Albumin: 4.5 g/dL (ref 3.5–5.2)
Alkaline Phosphatase: 50 U/L (ref 39–117)
Bilirubin, Direct: 0.1 mg/dL (ref 0.0–0.3)
Total Bilirubin: 0.6 mg/dL (ref 0.2–1.2)
Total Protein: 7.4 g/dL (ref 6.0–8.3)

## 2015-03-28 MED ORDER — ATORVASTATIN CALCIUM 20 MG PO TABS: 20.0000 mg | ORAL_TABLET | Freq: Every day | ORAL | Status: DC

## 2015-03-28 NOTE — Patient Instructions (Signed)
Medication Instructions:  Your physician recommends that you continue on your current medications as directed. Please refer to the Current Medication list given to you today.   Labwork: TODAY - liver panel and cholesterol  Your physician recommends that you return for lab work in: 1 year on the day of or a few days before your office visit with Dr. Acie Fredrickson.  You will need to FAST for this appointment - nothing to eat or drink after midnight the night before except water.    Testing/Procedures: None Ordered   Follow-Up: Your physician wants you to follow-up in: 1 year with Dr. Acie Fredrickson.  You will receive a reminder letter in the mail two months in advance. If you don't receive a letter, please call our office to schedule the follow-up appointment.

## 2015-03-30 ENCOUNTER — Other Ambulatory Visit: Payer: Self-pay | Admitting: Family Medicine

## 2015-04-03 NOTE — Telephone Encounter (Signed)
RF request for zolpidem LOV: 03/22/15 Next ov: 04/19/15 Last written: 11/20/14 #30 w/ 5RF Please advise. Thanks.

## 2015-04-17 ENCOUNTER — Other Ambulatory Visit: Payer: Self-pay | Admitting: Cardiovascular Disease

## 2015-04-18 ENCOUNTER — Other Ambulatory Visit: Payer: Self-pay | Admitting: Cardiovascular Disease

## 2015-04-19 ENCOUNTER — Ambulatory Visit (INDEPENDENT_AMBULATORY_CARE_PROVIDER_SITE_OTHER): Payer: 59 | Admitting: Family Medicine

## 2015-04-19 ENCOUNTER — Encounter: Payer: Self-pay | Admitting: Family Medicine

## 2015-04-19 VITALS — BP 128/84 | HR 82 | Temp 97.6°F | Resp 16 | Ht 72.0 in | Wt 219.0 lb

## 2015-04-19 DIAGNOSIS — F329 Major depressive disorder, single episode, unspecified: Secondary | ICD-10-CM

## 2015-04-19 DIAGNOSIS — F32A Depression, unspecified: Secondary | ICD-10-CM

## 2015-04-19 DIAGNOSIS — F418 Other specified anxiety disorders: Secondary | ICD-10-CM | POA: Diagnosis not present

## 2015-04-19 DIAGNOSIS — F419 Anxiety disorder, unspecified: Principal | ICD-10-CM

## 2015-04-19 MED ORDER — METFORMIN HCL 1000 MG PO TABS: ORAL_TABLET | ORAL | Status: DC

## 2015-04-19 MED ORDER — METOPROLOL TARTRATE 50 MG PO TABS: 50.0000 mg | ORAL_TABLET | Freq: Two times a day (BID) | ORAL | Status: DC

## 2015-04-19 MED ORDER — CITALOPRAM HYDROBROMIDE 20 MG PO TABS: 20.0000 mg | ORAL_TABLET | Freq: Every day | ORAL | Status: DC

## 2015-04-19 NOTE — Progress Notes (Signed)
Pre visit review using our clinic review tool, if applicable. No additional management support is needed unless otherwise documented below in the visit note. 

## 2015-04-19 NOTE — Progress Notes (Signed)
OFFICE NOTE  04/19/2015  CC:  Chief Complaint  Patient presents with  . Follow-up    Pt is not fasting.   HPI: Patient is a 59 y.o. Caucasian male who is here for 1 mo f/u anxiety and depression. Started citalopram 69m qd and prn lorazepam last visit. Ran out of citalopram 2-3 days ago but should have enough to last 3 more days. He is seeing a big difference on the med, coping much better with stress, feeling less of a down mood.  No side effect felt from med.  He only had to take lorazepam on two occasions since last visit.  Sleep is better.  No anhedonia and no avoidance behavior.  Pertinent PMH:  Past medical, surgical, social, and family history reviewed and no changes are noted since last office visit.  MEDS:  Outpatient Prescriptions Prior to Visit  Medication Sig Dispense Refill  . Alcohol Swabs PADS Please clean finger prior to checking blood sugar 100 each 12  . aspirin EC 81 MG EC tablet Take 1 tablet (81 mg total) by mouth daily.    .Marland Kitchenatorvastatin (LIPITOR) 20 MG tablet Take 1 tablet (20 mg total) by mouth daily at 6 PM. 90 tablet 3  . citalopram (CELEXA) 20 MG tablet Take 1 tablet (20 mg total) by mouth daily. 30 tablet 1  . clopidogrel (PLAVIX) 75 MG tablet TAKE ONE TABLET BY MOUTH DAILY WITH BREAKFAST 30 tablet 9  . gabapentin (NEURONTIN) 300 MG capsule 1 tab po qhs 30 capsule 3  . glucose blood test strip Use as instructed 100 each 12  . glucose monitoring kit (FREESTYLE) monitoring kit 1 each by Does not apply route as needed for other. Please choose glucometer of your choic 1 each 0  . LANCETS ULTRA THIN 30G MISC 1 Units by Does not apply route 4 (four) times daily. Breakfast, Lunch, Dinner, Bedtime 100 each 3  . lisinopril (PRINIVIL,ZESTRIL) 5 MG tablet TAKE ONE TABLET BY MOUTH ONE TIME DAILY 30 tablet 9  . LORazepam (ATIVAN) 0.5 MG tablet 1-2 tabs po q8h prn anxiety 30 tablet 0  . metFORMIN (GLUCOPHAGE) 1000 MG tablet TAKE 1 TABLET (1,000 MG TOTAL) BY MOUTH TWICE  DAILY WITH A MEAL 60 tablet 3  . metoprolol (LOPRESSOR) 50 MG tablet TAKE ONE TABLET BY MOUTH TWICE DAILY 60 tablet 11  . zolpidem (AMBIEN) 10 MG tablet TAKE ONE TABLET BY MOUTH AT BEDTIME AS NEEDED FOR SLEEP 30 tablet 5   No facility-administered medications prior to visit.    PE: Blood pressure 128/84, pulse 82, temperature 97.6 F (36.4 C), temperature source Oral, resp. rate 16, height 6' (1.829 m), weight 219 lb (99.338 kg), SpO2 97 %. Gen: Alert, well appearing.  Patient is oriented to person, place, time, and situation. AFFECT: pleasant, lucid thought and speech. No further exam today.  IMPRESSION AND PLAN:  1) GAD and mild depression: much improved. Continue 271mqd citalopram.  He has plenty of lorazepam to use prn--he is not requiring this much at all. Signs/symptoms to call or return for were reviewed and pt expressed understanding.  An After Visit Summary was printed and given to the patient.  FOLLOW UP: 3 mo

## 2015-05-16 ENCOUNTER — Other Ambulatory Visit: Payer: Self-pay | Admitting: Family Medicine

## 2015-05-18 ENCOUNTER — Ambulatory Visit: Payer: 59 | Admitting: Gastroenterology

## 2015-05-18 ENCOUNTER — Encounter: Payer: Self-pay | Admitting: Gastroenterology

## 2015-06-15 ENCOUNTER — Other Ambulatory Visit: Payer: Self-pay | Admitting: Family Medicine

## 2015-06-15 NOTE — Telephone Encounter (Signed)
RF request for citalopram LOV: 04/19/15 Next ov: 07/19/15 Last written:05/17/15 #30 w/ 0RF

## 2015-07-11 ENCOUNTER — Other Ambulatory Visit: Payer: Self-pay | Admitting: Family Medicine

## 2015-07-11 NOTE — Telephone Encounter (Signed)
RF request for gabapentin LOV: 03/22/15 Next ov: 07/19/15 Last written: 03/22/15 #30 w/ 3RF

## 2015-07-19 ENCOUNTER — Ambulatory Visit (INDEPENDENT_AMBULATORY_CARE_PROVIDER_SITE_OTHER): Payer: 59 | Admitting: Family Medicine

## 2015-07-19 ENCOUNTER — Encounter: Payer: Self-pay | Admitting: Family Medicine

## 2015-07-19 VITALS — BP 114/82 | HR 65 | Temp 97.6°F | Resp 16 | Ht 72.0 in | Wt 228.0 lb

## 2015-07-19 DIAGNOSIS — E118 Type 2 diabetes mellitus with unspecified complications: Secondary | ICD-10-CM | POA: Insufficient documentation

## 2015-07-19 DIAGNOSIS — Z23 Encounter for immunization: Secondary | ICD-10-CM | POA: Diagnosis not present

## 2015-07-19 DIAGNOSIS — G4700 Insomnia, unspecified: Secondary | ICD-10-CM | POA: Diagnosis not present

## 2015-07-19 DIAGNOSIS — I10 Essential (primary) hypertension: Secondary | ICD-10-CM | POA: Diagnosis not present

## 2015-07-19 DIAGNOSIS — E785 Hyperlipidemia, unspecified: Secondary | ICD-10-CM | POA: Diagnosis not present

## 2015-07-19 LAB — BASIC METABOLIC PANEL
BUN: 25 mg/dL — ABNORMAL HIGH (ref 6–23)
CO2: 28 mEq/L (ref 19–32)
Calcium: 9.8 mg/dL (ref 8.4–10.5)
Chloride: 101 mEq/L (ref 96–112)
Creatinine, Ser: 0.99 mg/dL (ref 0.40–1.50)
GFR: 81.68 mL/min (ref >60.00)
Glucose, Bld: 160 mg/dL — ABNORMAL HIGH (ref 70–99)
Potassium: 5 mEq/L (ref 3.5–5.1)
Sodium: 136 mEq/L (ref 135–145)

## 2015-07-19 LAB — HEMOGLOBIN A1C: Hgb A1c MFr Bld: 7.1 % — ABNORMAL HIGH (ref 4.6–6.5)

## 2015-07-19 MED ORDER — LORAZEPAM 0.5 MG PO TABS: ORAL_TABLET | ORAL | Status: DC

## 2015-07-19 MED ORDER — ZALEPLON 10 MG PO CAPS: 10.0000 mg | ORAL_CAPSULE | Freq: Every evening | ORAL | Status: DC | PRN

## 2015-07-19 NOTE — Progress Notes (Signed)
Pre visit review using our clinic review tool, if applicable. No additional management support is needed unless otherwise documented below in the visit note. 

## 2015-07-19 NOTE — Addendum Note (Signed)
Addended by: Tammi Sou on: 07/19/2015 05:00 PM   Modules accepted: Orders, Medications

## 2015-07-19 NOTE — Progress Notes (Signed)
OFFICE VISIT  07/19/2015   CC:  Chief Complaint  Patient presents with  . Follow-up    Pt is fasting.     HPI:    Patient is a 59 y.o. Caucasian male who presents for 4 mo f/u DM 2, HTN, hyperlipidemia, CRI stage II. Feeling well today.  Cardiology f/u visit 03/2015 was good--he is maintaining sinus rhythm.  Lipids were checked and were excellent.  Gluc's: fasting 140 estimated.  2H PP avg 130 or so.  Pioglitazone caused tongue/face swelling with first few doses so he d/c'd this med.  Insomnia problems, chronic:  Ambien doesn't seem to cause sedation until 5-6 hours after dosing, then it seems to only last a little while.  Also has weird dreams on [place]. No hangover effect.  OTC sleep aids lead to hangover effect. If he takes nothing for sleep he stays up all night.  Naps sometimes in day, esp if he's been up all night. No RLS.  His mind won't shut down.  Takes his statin and bp meds daily, no c/o side effects.  No home bp monitoring.   Past Medical History  Diagnosis Date  . NSTEMI (non-ST elevated myocardial infarction) [facility]) 07/2013    CABG x 22 Jul 2013  . Diabetes ([place]) 07/2013  . Hyperlipidemia   . Hypertension   . Paroxysmal atrial fibrillation ([place]) 07/2013    Started post-op CABG, converted on amiodarone--amiodarone stopped after a couple months  . Chronic renal insufficiency, stage II (mild) 2014/2015    CrCl @ 60 ml/min  . Obesity, Class I, BMI 30-34.9   . Erectile dysfunction     Dr. Tresa Moore (urol is also doing prostate ca screening).  Refractory to medications.  Pt likely to get penile implant as of urol f/u 01/2015.  . Diabetic peripheral neuropathy associated with type 2 diabetes mellitus [facility])     Past Surgical History  Procedure Laterality Date  . Shoulder surgery  1983    left; pins are in place  . Nasal fracture surgery  1979  . Coronary artery bypass graft N/A 08/02/2013    Procedure: CORONARY ARTERY BYPASS GRAFTING (CABG);  Surgeon: Grace Isaac, MD;  Location: [place];  Service: Open Heart Surgery;  Laterality: N/A;  Coronary artery bypass graft times five utilizing the left internal mammary artery and the right greater saphenous vein.  . Intraoperative transesophageal echocardiogram N/A 08/02/2013    Procedure: INTRAOPERATIVE TRANSESOPHAGEAL ECHOCARDIOGRAM;  Surgeon: Grace Isaac, MD;  Location: [place];  Service: Open Heart Surgery;  Laterality: N/A;.  EF 40%, LVH and LV dilatation, inf wall hypokinesis (at the time of his MI)  . Endovein harvest of greater saphenous vein Right 08/02/2013    Procedure: ENDOVEIN HARVEST OF GREATER SAPHENOUS VEIN;  Surgeon: Grace Isaac, MD;  Location: [place];  Service: Open Heart Surgery;  Laterality: Right;  . Left heart catheterization with coronary angiogram N/A 08/01/2013    Procedure: LEFT HEART CATHETERIZATION WITH CORONARY ANGIOGRAM;  Surgeon: Blane Ohara, MD;  Location: [facility] CATH LAB;  Service: Cardiovascular;  Laterality: N/A;    Outpatient Prescriptions Prior to Visit  Medication Sig Dispense Refill  . Alcohol Swabs PADS Please clean finger prior to checking blood sugar 100 each 12  . aspirin EC 81 MG EC tablet Take 1 tablet (81 mg total) by mouth daily.    Marland Kitchen atorvastatin (LIPITOR) 20 MG tablet Take 1 tablet (20 mg total) by mouth daily at 6 PM. 90 tablet 3  . citalopram (  CELEXA) 20 MG tablet TAKE 1 TABLET (20 MG TOTAL) BY MOUTH DAILY. 30 tablet 11  . clopidogrel (PLAVIX) 75 MG tablet TAKE ONE TABLET BY MOUTH DAILY WITH BREAKFAST 30 tablet 9  . gabapentin (NEURONTIN) 300 MG capsule TAKE ONE CAPSULE BY MOUTH AT BEDTIME 30 capsule 6  . glucose blood test strip Use as instructed 100 each 12  . glucose monitoring kit (FREESTYLE) monitoring kit 1 each by Does not apply route as needed for other. Please choose glucometer of your choic 1 each 0  . LANCETS ULTRA THIN 30G MISC 1 Units by Does not apply route 4 (four) times daily. Breakfast, Lunch, Dinner, Bedtime 100 each 3  .  lisinopril (PRINIVIL,ZESTRIL) 5 MG tablet TAKE ONE TABLET BY MOUTH ONE TIME DAILY 30 tablet 9  . metFORMIN (GLUCOPHAGE) 1000 MG tablet TAKE ONE TABLET BY MOUTH TWICE A DAY WITH MEALS 60 tablet 2  . metoprolol (LOPRESSOR) 50 MG tablet Take 1 tablet (50 mg total) by mouth 2 (two) times daily. 180 tablet 3  . zolpidem (AMBIEN) 10 MG tablet TAKE ONE TABLET BY MOUTH AT BEDTIME AS NEEDED FOR SLEEP 30 tablet 5  . LORazepam (ATIVAN) 0.5 MG tablet 1-2 tabs po q8h prn anxiety 30 tablet 0   No facility-administered medications prior to visit.    Allergies  Allergen Reactions  . Oxycodone Other (See Comments)    Psych/mental adverse effects  . Pioglitazone Swelling and Rash    ROS As per HPI  PE: Blood pressure 114/82, pulse 65, temperature 97.6 F (36.4 C), temperature source Oral, resp. rate 16, height 6' (1.829 m), weight 228 lb (103.42 kg), SpO2 95 %. Gen: Alert, well appearing.  Patient is oriented to person, place, time, and situation. AFFECT: pleasant, lucid thought and speech. No further exam today.  LABS:  Lab Results  Component Value Date   CHOL 102 03/28/2015   HDL 38.10* 03/28/2015   LDLCALC 38 03/28/2015   TRIG 129.0 03/28/2015   CHOLHDL 3 03/28/2015     Chemistry      Component Value Date/Time   NA 137 03/22/2015 1114   K 4.7 03/22/2015 1114   CL 105 03/22/2015 1114   CO2 26 03/22/2015 1114   BUN 30* 03/22/2015 1114   CREATININE 1.11 03/22/2015 1114      Component Value Date/Time   CALCIUM 9.6 03/22/2015 1114   ALKPHOS 50 03/28/2015 0908   AST 17 03/28/2015 0908   ALT 26 03/28/2015 0908   BILITOT 0.6 03/28/2015 0908     Lab Results  Component Value Date   HGBA1C 7.3* 03/22/2015   Lab Results  Component Value Date   WBC 12.8* 11/17/2013   HGB 15.7 11/17/2013   HCT 47.4 11/17/2013   MCV 92.0 11/17/2013   PLT 300.0 11/17/2013    IMPRESSION AND PLAN:  1) DM 2, control fair per home monitoring. Allergy to pioglitazone that I tried to add this  year. HbA1c today.  2) HTN; The current medical regimen is effective;  continue present plan and medications. BMET today.  3) Hyperlipidemia: recent cholest panel via cardiologist was excellent. Continue statin.  4) CRI stage 2: lytes/cr today.  He is on ACE-I.  5) CAD with hx of NSTEMI and subsequent CABG 07/2013: recent cardiology f/u no changes made--specifically, he remains on DAPT.  6) Insomnia chronic: failed ambien.  Will try sonata 57m qhs.  An After Visit Summary was printed and given to the patient.  FOLLOW UP: Return in about 3 months (around  10/17/2015) for annual CPE (fasting).

## 2015-07-30 ENCOUNTER — Encounter: Payer: Self-pay | Admitting: Family Medicine

## 2015-07-30 ENCOUNTER — Ambulatory Visit (HOSPITAL_BASED_OUTPATIENT_CLINIC_OR_DEPARTMENT_OTHER)
Admission: RE | Admit: 2015-07-30 | Discharge: 2015-07-30 | Disposition: A | Discharge status: Home / Self Care | Payer: 59 | Source: Ambulatory Visit | Attending: Family Medicine | Admitting: Family Medicine

## 2015-07-30 ENCOUNTER — Ambulatory Visit (INDEPENDENT_AMBULATORY_CARE_PROVIDER_SITE_OTHER): Payer: 59 | Admitting: Family Medicine

## 2015-07-30 VITALS — BP 124/81 | Temp 97.8°F | Ht 73.25 in | Wt 227.8 lb

## 2015-07-30 DIAGNOSIS — M79605 Pain in left leg: Secondary | ICD-10-CM | POA: Insufficient documentation

## 2015-07-30 DIAGNOSIS — M79602 Pain in left arm: Secondary | ICD-10-CM | POA: Diagnosis not present

## 2015-07-30 NOTE — Progress Notes (Signed)
OFFICE NOTE  07/30/2015  CC: No chief complaint on file.  HPI: Patient is a 59 y.o. Caucasian male who is here for pain in back of L upper leg, onset last night. No injury.  Started while he was sitting on toilet and he thought maybe it was a cramp about to start.   Hurts some while walking today.  No swelling of legs.  No recent prolonged immobility.  No hx of DVT or PE.  Pertinent PMH:  Past medical, surgical, social, and family history reviewed and no changes are noted since last office visit.  MEDS:  Outpatient Prescriptions Prior to Visit  Medication Sig Dispense Refill  . aspirin EC 81 MG EC tablet Take 1 tablet (81 mg total) by mouth daily.    Marland Kitchen atorvastatin (LIPITOR) 20 MG tablet Take 1 tablet (20 mg total) by mouth daily at 6 PM. 90 tablet 3  . citalopram (CELEXA) 20 MG tablet TAKE 1 TABLET (20 MG TOTAL) BY MOUTH DAILY. 30 tablet 11  . clopidogrel (PLAVIX) 75 MG tablet TAKE ONE TABLET BY MOUTH DAILY WITH BREAKFAST 30 tablet 9  . gabapentin (NEURONTIN) 300 MG capsule TAKE ONE CAPSULE BY MOUTH AT BEDTIME 30 capsule 6  . glucose blood test strip Use as instructed 100 each 12  . glucose monitoring kit (FREESTYLE) monitoring kit 1 each by Does not apply route as needed for other. Please choose glucometer of your choic 1 each 0  . LANCETS ULTRA THIN 30G MISC 1 Units by Does not apply route 4 (four) times daily. Breakfast, Lunch, Dinner, Bedtime 100 each 3  . lisinopril (PRINIVIL,ZESTRIL) 5 MG tablet TAKE ONE TABLET BY MOUTH ONE TIME DAILY 30 tablet 9  . LORazepam (ATIVAN) 0.5 MG tablet 1-2 tabs po q8h prn anxiety 30 tablet 1  . metFORMIN (GLUCOPHAGE) 1000 MG tablet TAKE ONE TABLET BY MOUTH TWICE A DAY WITH MEALS 60 tablet 2  . metoprolol (LOPRESSOR) 50 MG tablet Take 1 tablet (50 mg total) by mouth 2 (two) times daily. 180 tablet 3  . zaleplon (SONATA) 10 MG capsule Take 1 capsule (10 mg total) by mouth at bedtime as needed for sleep. 30 capsule 5  . Alcohol Swabs PADS Please clean  finger prior to checking blood sugar 100 each 12   No facility-administered medications prior to visit.    PE: Blood pressure 124/81, temperature 97.8 F (36.6 C), temperature source Oral, height 6' 1.25" (1.861 m), weight 227 lb 12.8 oz (103.329 kg). Gen: Alert, well appearing.  Patient is oriented to person, place, time, and situation. L hamstring region has mild TTP w/out focal skin or soft tissue nodularity.  There is a patch of ecchymoses just proximal to the popliteal fossa that is nontender, w/out erythema or warmth. No LE edema or asymmetry.   IMPRESSION AND PLAN:  L hamstring region pain/tenderness; need to r/o DVT. Ordered venous doppler on L leg to be done ASAP: [facility], 6:30 pm tonight.  An After Visit Summary was printed and given to the patient.  FOLLOW UP: to be determined based on results of w/u.

## 2015-08-11 ENCOUNTER — Other Ambulatory Visit: Payer: Self-pay | Admitting: Family Medicine

## 2015-08-19 DIAGNOSIS — R310 Gross hematuria: Secondary | ICD-10-CM

## 2015-08-19 HISTORY — DX: Gross hematuria: R31.0

## 2015-09-27 ENCOUNTER — Other Ambulatory Visit: Payer: Self-pay | Admitting: *Deleted

## 2015-09-27 MED ORDER — ZALEPLON 10 MG PO CAPS: 10.0000 mg | ORAL_CAPSULE | Freq: Every evening | ORAL | Status: DC | PRN

## 2015-09-27 NOTE — Telephone Encounter (Signed)
Pt LMOM on 09/27/15 at 11:39am stating that [facility] closed so he needs a new Rx sent to [place].   RF request for zaleplon LOV: 07/19/15 Next ov: 10/17/15 Last written: 07/19/15 #30 w/ 5RF  Please advise. Thanks.

## 2015-09-27 NOTE — Telephone Encounter (Signed)
Rx faxed

## 2015-10-04 ENCOUNTER — Telehealth: Payer: Self-pay | Admitting: *Deleted

## 2015-10-04 ENCOUNTER — Other Ambulatory Visit: Payer: Self-pay | Admitting: Family Medicine

## 2015-10-04 MED ORDER — ZOLPIDEM TARTRATE 10 MG PO TABS: 10.0000 mg | ORAL_TABLET | Freq: Every evening | ORAL | Status: DC | PRN

## 2015-10-04 NOTE — Telephone Encounter (Signed)
Rx faxed.   Pt advised and voiced understanding.   

## 2015-10-04 NOTE — Telephone Encounter (Signed)
Pt LMOM on 10/04/15 at 12:42pm requesting a call back. No other information left.   Spoke to pt and he stated that he has been picked for jury duty and the prosecutor has been badgering him which has been causing his to get upset and stressed. He is requesting that Dr. Anitra Lauth write a letter for the court stating that he can not be a juror due to recent cardiac problems and stress.    He also wants to know if he can go back to [place] to help his sleep. He stated that the zaleplon is not helping and he is having to take two. Please advise. Thanks.

## 2015-10-04 NOTE — Telephone Encounter (Signed)
I'll change him back to [place]. However, I don't write jury letters.  Tell him I'm sorry but I cannot help him with this.

## 2015-10-15 ENCOUNTER — Other Ambulatory Visit: Payer: Self-pay | Admitting: *Deleted

## 2015-10-15 MED ORDER — ATORVASTATIN CALCIUM 20 MG PO TABS: 20.0000 mg | ORAL_TABLET | Freq: Every day | ORAL | Status: DC

## 2015-10-15 NOTE — Telephone Encounter (Signed)
Fax from [facility].  RF request for atorvastatin LOV: 07/19/15 Next ov: 10/17/15 Last written: 03/28/15 #90 w/ 3RF ([facility] closed).

## 2015-10-17 ENCOUNTER — Encounter: Payer: Self-pay | Admitting: Internal Medicine

## 2015-10-17 ENCOUNTER — Encounter: Payer: Self-pay | Admitting: Family Medicine

## 2015-10-17 ENCOUNTER — Ambulatory Visit (INDEPENDENT_AMBULATORY_CARE_PROVIDER_SITE_OTHER): Payer: BLUE CROSS/BLUE SHIELD | Admitting: Family Medicine

## 2015-10-17 VITALS — BP 147/96 | HR 69 | Temp 97.3°F | Resp 16 | Ht 72.0 in | Wt 224.0 lb

## 2015-10-17 DIAGNOSIS — Z0000 Encounter for general adult medical examination without abnormal findings: Secondary | ICD-10-CM | POA: Diagnosis not present

## 2015-10-17 DIAGNOSIS — E118 Type 2 diabetes mellitus with unspecified complications: Secondary | ICD-10-CM | POA: Diagnosis not present

## 2015-10-17 DIAGNOSIS — R310 Gross hematuria: Secondary | ICD-10-CM | POA: Diagnosis not present

## 2015-10-17 DIAGNOSIS — Z1211 Encounter for screening for malignant neoplasm of colon: Secondary | ICD-10-CM

## 2015-10-17 DIAGNOSIS — I10 Essential (primary) hypertension: Secondary | ICD-10-CM | POA: Diagnosis not present

## 2015-10-17 LAB — HEPATITIS C ANTIBODY: HCV Ab: REACTIVE — AB

## 2015-10-17 LAB — URINALYSIS, ROUTINE W REFLEX MICROSCOPIC
Bilirubin Urine: NEGATIVE
Hgb urine dipstick: NEGATIVE
Ketones, ur: NEGATIVE
Leukocytes, UA: NEGATIVE
Nitrite: NEGATIVE
RBC / HPF: NONE SEEN (ref 0–?)
Specific Gravity, Urine: 1.02 (ref 1.000–1.030)
Total Protein, Urine: NEGATIVE
Urine Glucose: NEGATIVE
Urobilinogen, UA: 0.2 (ref 0.0–1.0)
pH: 6 (ref 5.0–8.0)

## 2015-10-17 LAB — HEMOGLOBIN A1C: Hgb A1c MFr Bld: 7.3 % — ABNORMAL HIGH (ref 4.6–6.5)

## 2015-10-17 LAB — CBC WITH DIFFERENTIAL/PLATELET
Basophils Absolute: 0.1 10*3/uL (ref 0.0–0.1)
Basophils Relative: 0.5 % (ref 0.0–3.0)
Eosinophils Absolute: 0.4 10*3/uL (ref 0.0–0.7)
Eosinophils Relative: 4.1 % (ref 0.0–5.0)
HCT: 46.5 % (ref 39.0–52.0)
Hemoglobin: 15.4 g/dL (ref 13.0–17.0)
Lymphocytes Relative: 28.7 % (ref 12.0–46.0)
Lymphs Abs: 3.1 10*3/uL (ref 0.7–4.0)
MCHC: 33 g/dL (ref 30.0–36.0)
MCV: 95 fl (ref 78.0–100.0)
Monocytes Absolute: 0.9 10*3/uL (ref 0.1–1.0)
Monocytes Relative: 8.7 % (ref 3.0–12.0)
Neutro Abs: 6.2 10*3/uL (ref 1.4–7.7)
Neutrophils Relative %: 58 % (ref 43.0–77.0)
Platelets: 275 10*3/uL (ref 150.0–400.0)
RBC: 4.9 Mil/uL (ref 4.22–5.81)
RDW: 13.4 % (ref 11.5–15.5)
WBC: 10.6 10*3/uL — ABNORMAL HIGH (ref 4.0–10.5)

## 2015-10-17 LAB — BASIC METABOLIC PANEL
BUN: 22 mg/dL (ref 6–23)
CO2: 26 mEq/L (ref 19–32)
Calcium: 9.9 mg/dL (ref 8.4–10.5)
Chloride: 101 mEq/L (ref 96–112)
Creatinine, Ser: 0.93 mg/dL (ref 0.40–1.50)
GFR: 87.72 mL/min (ref >60.00)
Glucose, Bld: 140 mg/dL — ABNORMAL HIGH (ref 70–99)
Potassium: 4.6 mEq/L (ref 3.5–5.1)
Sodium: 135 mEq/L (ref 135–145)

## 2015-10-17 LAB — TSH: TSH: 1.33 u[IU]/mL (ref 0.35–4.50)

## 2015-10-17 NOTE — Progress Notes (Signed)
Pre visit review using our clinic review tool, if applicable. No additional management support is needed unless otherwise documented below in the visit note. 

## 2015-10-17 NOTE — Patient Instructions (Signed)
Make sure you call your urologist, Dr. Tresa Moore, to make appt about seeing blood in your urine.

## 2015-10-17 NOTE — Progress Notes (Signed)
Office Note 10/17/2015  CC:  Chief Complaint  Patient presents with  . Annual Exam    patient is not fasting   HPI:  Alex Yang is a 60 y.o. White male who is here for annual health maintenance exam. Home bp monitoring normal.   About 1 mo ago he urinated in the morning and saw blood.  No pain.  It has not happened since. He said he had been holding his urine for too long, otherwise no problems.  Past Medical History  Diagnosis Date  . NSTEMI (non-ST elevated myocardial infarction) [facility]) 07/2013    CABG x 22 Jul 2013  . Diabetes ([place]) 07/2013  . Hyperlipidemia   . Hypertension   . Paroxysmal atrial fibrillation ([place]) 07/2013    Started post-op CABG, converted on amiodarone--amiodarone stopped after a couple months  . Chronic renal insufficiency, stage II (mild) 2014/2015    CrCl @ 60 ml/min  . Obesity, Class I, BMI 30-34.9   . Erectile dysfunction     Dr. Tresa Moore (urol is also doing prostate ca screening).  Refractory to medications.  Pt likely to get penile implant as of urol f/u 01/2015.  . Diabetic peripheral neuropathy associated with type 2 diabetes mellitus [facility])     Past Surgical History  Procedure Laterality Date  . Shoulder surgery  1983    left; pins are in place  . Nasal fracture surgery  1979  . Coronary artery bypass graft N/A 08/02/2013    Procedure: CORONARY ARTERY BYPASS GRAFTING (CABG);  Surgeon: Grace Isaac, MD;  Location: [place];  Service: Open Heart Surgery;  Laterality: N/A;  Coronary artery bypass graft times five utilizing the left internal mammary artery and the right greater saphenous vein.  . Intraoperative transesophageal echocardiogram N/A 08/02/2013    Procedure: INTRAOPERATIVE TRANSESOPHAGEAL ECHOCARDIOGRAM;  Surgeon: Grace Isaac, MD;  Location: [place];  Service: Open Heart Surgery;  Laterality: N/A;.  EF 40%, LVH and LV dilatation, inf wall hypokinesis (at the time of his MI)  . Endovein harvest of greater saphenous vein Right  08/02/2013    Procedure: ENDOVEIN HARVEST OF GREATER SAPHENOUS VEIN;  Surgeon: Grace Isaac, MD;  Location: [place];  Service: Open Heart Surgery;  Laterality: Right;  . Left heart catheterization with coronary angiogram N/A 08/01/2013    Procedure: LEFT HEART CATHETERIZATION WITH CORONARY ANGIOGRAM;  Surgeon: Blane Ohara, MD;  Location: [facility] CATH LAB;  Service: Cardiovascular;  Laterality: N/A;    Family History  Problem Relation Age of Onset  . Heart attack Mother   . Heart disease Mother   . Heart disease Father     Social History   Social History  . Marital Status: Divorced    Spouse Name: N/A  . Number of Children: N/A  . Years of Education: N/A   Occupational History  . Process Server    Social History Main Topics  . Smoking status: Never Smoker   . Smokeless tobacco: Not on file  . Alcohol Use: No  . Drug Use: No  . Sexual Activity: Not on file   Other Topics Concern  . Not on file   Social History Narrative   Single, lives with fiance'.   Has 3 biologic children.   Occupation:  Theme park manager (self employed).   Education: HS   No tobacco, alc, or drugs   Starts cardiac rehab 10/06/13.    Outpatient Prescriptions Prior to Visit  Medication Sig Dispense Refill  . Alcohol Swabs PADS Please clean  finger prior to checking blood sugar 100 each 12  . aspirin EC 81 MG EC tablet Take 1 tablet (81 mg total) by mouth daily.    Marland Kitchen atorvastatin (LIPITOR) 20 MG tablet Take 1 tablet (20 mg total) by mouth daily at 6 PM. 90 tablet 3  . citalopram (CELEXA) 20 MG tablet TAKE 1 TABLET (20 MG TOTAL) BY MOUTH DAILY. 30 tablet 11  . clopidogrel (PLAVIX) 75 MG tablet TAKE ONE TABLET BY MOUTH DAILY WITH BREAKFAST 30 tablet 9  . gabapentin (NEURONTIN) 300 MG capsule TAKE ONE CAPSULE BY MOUTH AT BEDTIME 30 capsule 6  . glucose blood test strip Use as instructed 100 each 12  . glucose monitoring kit (FREESTYLE) monitoring kit 1 each by Does not apply route as needed for other.  Please choose glucometer of your choic 1 each 0  . LANCETS ULTRA THIN 30G MISC 1 Units by Does not apply route 4 (four) times daily. Breakfast, Lunch, Dinner, Bedtime 100 each 3  . lisinopril (PRINIVIL,ZESTRIL) 5 MG tablet TAKE ONE TABLET BY MOUTH ONE TIME DAILY 30 tablet 9  . LORazepam (ATIVAN) 0.5 MG tablet 1-2 tabs po q8h prn anxiety 30 tablet 1  . metFORMIN (GLUCOPHAGE) 1000 MG tablet TAKE ONE TABLET BY MOUTH TWICE A DAY WITH MEALS 60 tablet 3  . metoprolol (LOPRESSOR) 50 MG tablet Take 1 tablet (50 mg total) by mouth 2 (two) times daily. 180 tablet 3  . zolpidem (AMBIEN) 10 MG tablet Take 1 tablet (10 mg total) by mouth at bedtime as needed for sleep. 30 tablet 5   No facility-administered medications prior to visit.    Allergies  Allergen Reactions  . Oxycodone Other (See Comments)    Psych/mental adverse effects  . Pioglitazone Swelling and Rash    ROS Review of Systems  Constitutional: Negative for fever, chills, appetite change and fatigue.  HENT: Negative for congestion, dental problem, ear pain and sore throat.   Eyes: Negative for discharge, redness and visual disturbance.  Respiratory: Negative for cough, chest tightness, shortness of breath and wheezing.   Cardiovascular: Negative for chest pain, palpitations and leg swelling.  Gastrointestinal: Negative for nausea, vomiting, abdominal pain, diarrhea and blood in stool.  Genitourinary: Positive for hematuria (gross hematuria x 1 episode approx Jan 2017). Negative for dysuria, urgency, frequency, flank pain and difficulty urinating.  Musculoskeletal: Negative for myalgias, back pain, joint swelling, arthralgias and neck stiffness.  Skin: Negative for pallor and rash.  Neurological: Negative for dizziness, speech difficulty, weakness and headaches.  Hematological: Negative for adenopathy. Does not bruise/bleed easily.  Psychiatric/Behavioral: Negative for confusion and sleep disturbance. The patient is not nervous/anxious.     PE; Blood pressure 147/96, pulse 69, temperature 97.3 F (36.3 C), temperature source Oral, resp. rate 16, height 6' (1.829 m), weight 224 lb (101.606 kg), SpO2 99 %. Gen: Alert, well appearing.  Patient is oriented to person, place, time, and situation. AFFECT: pleasant, lucid thought and speech. ENT: Ears: EACs clear, normal epithelium.  TMs with good light reflex and landmarks bilaterally.  Eyes: no injection, icteris, swelling, or exudate.  EOMI, PERRLA. Nose: no drainage or turbinate edema/swelling.  No injection or focal lesion.  Mouth: lips without lesion/swelling.  Oral mucosa pink and moist.  Dentition intact and without obvious caries or gingival swelling.  Oropharynx without erythema, exudate, or swelling.  Neck: supple/nontender.  No LAD, mass, or TM.  Carotid pulses 2+ bilaterally, without bruits. CV: RRR, no m/r/g.   LUNGS: CTA bilat, nonlabored resps, good  aeration in all lung fields. ABD: soft, NT, ND, BS normal.  No hepatospenomegaly or mass.  No bruits. EXT: no clubbing, cyanosis, or edema.  Musculoskeletal: no joint swelling, erythema, warmth, or tenderness.  ROM of all joints intact. Skin - no sores or suspicious lesions or rashes or color changes Rectal: deferred due to prostate cancer screening being done by pt's urologist.  Pertinent labs:  Lab Results  Component Value Date   TSH 2.457 07/30/2013   Lab Results  Component Value Date   WBC 12.8* 11/17/2013   HGB 15.7 11/17/2013   HCT 47.4 11/17/2013   MCV 92.0 11/17/2013   PLT 300.0 11/17/2013   Lab Results  Component Value Date   CREATININE 0.99 07/19/2015   BUN 25* 07/19/2015   NA 136 07/19/2015   K 5.0 07/19/2015   CL 101 07/19/2015   CO2 28 07/19/2015   Lab Results  Component Value Date   ALT 26 03/28/2015   AST 17 03/28/2015   ALKPHOS 50 03/28/2015   BILITOT 0.6 03/28/2015   Lab Results  Component Value Date   CHOL 102 03/28/2015   Lab Results  Component Value Date   HDL 38.10*  03/28/2015   Lab Results  Component Value Date   LDLCALC 38 03/28/2015   Lab Results  Component Value Date   TRIG 129.0 03/28/2015   Lab Results  Component Value Date   CHOLHDL 3 03/28/2015   Lab Results  Component Value Date   HGBA1C 7.1* 07/19/2015    ASSESSMENT AND PLAN:   Health maintenance exam: Reviewed age and gender appropriate health maintenance issues (prudent diet, regular exercise, health risks of tobacco and excessive alcohol, use of seatbelts, fire alarms in home, use of sunscreen).  Also reviewed age and gender appropriate health screening as well as vaccine recommendations. No vaccines due. Will do Hep C screening, as well as non- fasting CBC, TSH, BMET, and HbA1c. Refer to GI for colon cancer screening (referred last year but pt cancelled b/c he got scared.  He now agrees to try again). Prostate ca screening per Dr. Tresa Moore with [facility]. Further instructions: Make sure you call your urologist, Dr. Tresa Moore, to make appt about seeing blood in your urine. CC urine specimen obtained today: sent for UA with reflex microscopy today.  An After Visit Summary was printed and given to the patient.  FOLLOW UP:  Return in about 3 months (around 01/17/2016) for routine chronic illness f/u.

## 2015-10-17 NOTE — Addendum Note (Signed)
Addended by: Milta Deiters on: 10/17/2015 10:11 AM   Modules accepted: Miquel Dunn

## 2015-10-19 LAB — HEPATITIS C RNA QUANTITATIVE: HCV Quantitative: NOT DETECTED IU/mL (ref <15)

## 2015-11-14 ENCOUNTER — Other Ambulatory Visit: Payer: Self-pay | Admitting: *Deleted

## 2015-11-14 MED ORDER — METFORMIN HCL 1000 MG PO TABS: 1000.0000 mg | ORAL_TABLET | Freq: Two times a day (BID) | ORAL | Status: DC

## 2015-11-14 NOTE — Telephone Encounter (Signed)
RF request for metformin LOV: 10/17/15 Next ov: 01/17/16 Last written: 08/14/15 #60 w/ 3RF

## 2015-11-19 ENCOUNTER — Other Ambulatory Visit: Payer: Self-pay | Admitting: *Deleted

## 2015-11-19 MED ORDER — CLOPIDOGREL BISULFATE 75 MG PO TABS: 75.0000 mg | ORAL_TABLET | Freq: Every day | ORAL | Status: DC

## 2015-11-19 NOTE — Telephone Encounter (Signed)
RF request for gabapentin - requesting 90 day supply LOV: 10/17/15 Next ov: 01/17/16 Last written: 07/11/15 #30 w/ 6RF  Please advise. Thanks.

## 2015-11-20 MED ORDER — GABAPENTIN 300 MG PO CAPS: 300.0000 mg | ORAL_CAPSULE | Freq: Every day | ORAL | Status: DC

## 2015-12-06 ENCOUNTER — Encounter: Payer: BLUE CROSS/BLUE SHIELD | Admitting: Internal Medicine

## 2016-01-17 ENCOUNTER — Ambulatory Visit: Payer: BLUE CROSS/BLUE SHIELD | Admitting: Family Medicine

## 2016-01-21 ENCOUNTER — Encounter: Payer: Self-pay | Admitting: Internal Medicine

## 2016-01-21 ENCOUNTER — Ambulatory Visit (INDEPENDENT_AMBULATORY_CARE_PROVIDER_SITE_OTHER): Payer: BLUE CROSS/BLUE SHIELD | Admitting: Internal Medicine

## 2016-01-21 VITALS — BP 120/78 | HR 76 | Ht 72.0 in | Wt 219.4 lb

## 2016-01-21 DIAGNOSIS — Z1211 Encounter for screening for malignant neoplasm of colon: Secondary | ICD-10-CM

## 2016-01-21 DIAGNOSIS — T45515A Adverse effect of anticoagulants, initial encounter: Secondary | ICD-10-CM | POA: Diagnosis not present

## 2016-01-21 DIAGNOSIS — Z7902 Long term (current) use of antithrombotics/antiplatelets: Secondary | ICD-10-CM

## 2016-01-21 DIAGNOSIS — D6959 Other secondary thrombocytopenia: Secondary | ICD-10-CM | POA: Diagnosis not present

## 2016-01-21 MED ORDER — NA SULFATE-K SULFATE-MG SULF 17.5-3.13-1.6 GM/177ML PO SOLN: ORAL | Status: DC

## 2016-01-21 NOTE — Progress Notes (Signed)
Patient ID: MONTA POLICE, male   DOB: [birth year], 60 y.o.   MRN: [id number] HPI: Alex Yang is a 60 year old male with a history of CAD status post 5 vessel CABG in December 2014, hypertension, hyperlipidemia, diabetes, chronic renal insufficiency who is seen in consultation at the request of Dr. Anitra Lauth to evaluate colorectal cancer screening in the setting of chronic Plavix use. He is here alone today. He reports that he is feeling well. He denies specific GI complaint. Bowel movements are regular though he has noticed looser stools with more intestinal gas and starting metformin for diabetes. He denies blood in his stool or melena. Denies abdominal pain. Denies issues with heartburn though occasionally he does have indigestion. No dysphagia or odynophagia. Stable weight. No nausea or vomiting. He takes Plavix daily under the direction of his cardiologist Dr. Acie Fredrickson. He denies family history of colon cancer. 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 He has never been a smoker. No alcohol use. He is married with 3 children.  Past Medical History  Diagnosis Date  . NSTEMI (non-ST elevated myocardial infarction) [facility]) 07/2013    CABG x 22 Jul 2013  . Diabetes ([place]) 07/2013  . Hyperlipidemia   . Hypertension   . Paroxysmal atrial fibrillation ([place]) 07/2013    Started post-op CABG, converted on amiodarone--amiodarone stopped after a couple months  . Chronic renal insufficiency, stage II (mild) 2014/2015    CrCl @ 60 ml/min  . Obesity, Class I, BMI 30-34.9   . Erectile dysfunction     Dr. Tresa Moore (urol is also doing prostate ca screening).  Refractory to medications.  Pt likely to get penile implant as of urol f/u 01/2015.  . Diabetic peripheral neuropathy associated with type 2 diabetes mellitus [facility])     Past Surgical History  Procedure Laterality Date  . Shoulder surgery  1983    left; pins are in place  . Nasal fracture surgery  1979  . Coronary artery bypass graft N/A  08/02/2013    Procedure: CORONARY ARTERY BYPASS GRAFTING (CABG);  Surgeon: Grace Isaac, MD;  Location: [place];  Service: Open Heart Surgery;  Laterality: N/A;  Coronary artery bypass graft times five utilizing the left internal mammary artery and the right greater saphenous vein.  . Intraoperative transesophageal echocardiogram N/A 08/02/2013    Procedure: INTRAOPERATIVE TRANSESOPHAGEAL ECHOCARDIOGRAM;  Surgeon: Grace Isaac, MD;  Location: [place];  Service: Open Heart Surgery;  Laterality: N/A;.  EF 40%, LVH and LV dilatation, inf wall hypokinesis (at the time of his MI)  . Endovein harvest of greater saphenous vein Right 08/02/2013    Procedure: ENDOVEIN HARVEST OF GREATER SAPHENOUS VEIN;  Surgeon: Grace Isaac, MD;  Location: [place];  Service: Open Heart Surgery;  Laterality: Right;  . Left heart catheterization with coronary angiogram N/A 08/01/2013    Procedure: LEFT HEART CATHETERIZATION WITH CORONARY ANGIOGRAM;  Surgeon: Blane Ohara, MD;  Location: [facility] CATH LAB;  Service: Cardiovascular;  Laterality: N/A;    Outpatient Prescriptions Prior to Visit  Medication Sig Dispense Refill  . Alcohol Swabs PADS Please clean finger prior to checking blood sugar 100 each 12  . aspirin EC 81 MG EC tablet Take 1 tablet (81 mg total) by mouth daily.    Marland Kitchen atorvastatin (LIPITOR) 20 MG tablet Take 1 tablet (20 mg total) by mouth daily at 6 PM. 90 tablet 3  . citalopram (CELEXA) 20 MG tablet TAKE 1 TABLET (20 MG TOTAL) BY MOUTH  DAILY. 30 tablet 11  . clopidogrel (PLAVIX) 75 MG tablet Take 1 tablet (75 mg total) by mouth daily with breakfast. 90 tablet 1  . gabapentin (NEURONTIN) 300 MG capsule Take 1 capsule (300 mg total) by mouth at bedtime. 90 capsule 3  . glucose blood test strip Use as instructed 100 each 12  . glucose monitoring kit (FREESTYLE) monitoring kit 1 each by Does not apply route as needed for other. Please choose glucometer of your choic 1 each 0  . LANCETS ULTRA THIN 30G  MISC 1 Units by Does not apply route 4 (four) times daily. Breakfast, Lunch, Dinner, Bedtime 100 each 3  . lisinopril (PRINIVIL,ZESTRIL) 5 MG tablet TAKE ONE TABLET BY MOUTH ONE TIME DAILY 30 tablet 9  . LORazepam (ATIVAN) 0.5 MG tablet 1-2 tabs po q8h prn anxiety 30 tablet 1  . metFORMIN (GLUCOPHAGE) 1000 MG tablet Take 1 tablet (1,000 mg total) by mouth 2 (two) times daily with a meal. 180 tablet 1  . metoprolol (LOPRESSOR) 50 MG tablet Take 1 tablet (50 mg total) by mouth 2 (two) times daily. 180 tablet 3  . zolpidem (AMBIEN) 10 MG tablet Take 1 tablet (10 mg total) by mouth at bedtime as needed for sleep. 30 tablet 5   No facility-administered medications prior to visit.    Allergies  Allergen Reactions  . Oxycodone Other (See Comments)    Psych/mental adverse effects  . Pioglitazone Swelling and Rash    Family History  Problem Relation Age of Onset  . Heart attack Mother   . Heart disease Mother   . Heart disease Father   . Diabetes Mother     Social History  Substance Use Topics  . Smoking status: Never Smoker   . Smokeless tobacco: None  . Alcohol Use: No    ROS: As per history of present illness, otherwise negative  BP 120/78 mmHg  Pulse 76  Ht 6' (1.829 m)  Wt 219 lb 6.4 oz (99.519 kg)  BMI 29.75 kg/m2 Constitutional: Well-developed and well-nourished. No distress. HEENT: Normocephalic and atraumatic. Oropharynx is clear and moist. No oropharyngeal exudate. Conjunctivae are normal.  No scleral icterus. Neck: Neck supple. Trachea midline. Cardiovascular: Normal rate, regular rhythm and intact distal pulses. No M/R/G Pulmonary/chest: Effort normal and breath sounds normal. No wheezing, rales or rhonchi. Abdominal: Soft, nontender, nondistended. Bowel sounds active throughout. There are no masses palpable. No hepatosplenomegaly. Extremities: no clubbing, cyanosis, or edema Lymphadenopathy: No cervical adenopathy noted. Neurological: Alert and oriented to person  place and time. Skin: Skin is warm and dry. No rashes noted. Psychiatric: Normal mood and affect. Behavior is normal.  RELEVANT LABS AND IMAGING: CBC    Component Value Date/Time   WBC 10.6* 10/17/2015 1000   RBC 4.90 10/17/2015 1000   HGB 15.4 10/17/2015 1000   HCT 46.5 10/17/2015 1000   PLT 275.0 10/17/2015 1000   MCV 95.0 10/17/2015 1000   MCH 31.4 08/05/2013 0450   MCHC 33.0 10/17/2015 1000   RDW 13.4 10/17/2015 1000   LYMPHSABS 3.1 10/17/2015 1000   MONOABS 0.9 10/17/2015 1000   EOSABS 0.4 10/17/2015 1000   BASOSABS 0.1 10/17/2015 1000    CMP     Component Value Date/Time   NA 135 10/17/2015 1000   K 4.6 10/17/2015 1000   CL 101 10/17/2015 1000   CO2 26 10/17/2015 1000   GLUCOSE 140* 10/17/2015 1000   BUN 22 10/17/2015 1000   CREATININE 0.93 10/17/2015 1000   CALCIUM 9.9 10/17/2015 1000  PROT 7.4 03/28/2015 0908   ALBUMIN 4.5 03/28/2015 0908   AST 17 03/28/2015 0908   ALT 26 03/28/2015 0908   ALKPHOS 50 03/28/2015 0908   BILITOT 0.6 03/28/2015 0908   GFRNONAA >90 08/05/2013 0450   GFRAA >90 08/05/2013 0450    ASSESSMENT/PLAN: 60 year old male with a history of CAD status post 5 vessel CABG in December 2014, hypertension, hyperlipidemia, diabetes, chronic renal insufficiency who is seen in consultation at the request of Dr. Anitra Lauth to evaluate colorectal cancer screening in the setting of chronic Plavix use  1. CRC screening -- he is appropriate for average risk screening colonoscopy. He has never before had colonoscopy. We discussed the risks, benefits and alternatives and he is agreeable to proceed. We will discuss holding Plavix with his cardiologist prior to the procedure. Hold Plavix 5 days before procedure - will instruct when and how to resume after procedure. Risks and benefits of procedure including bleeding, perforation, infection, missed lesions, medication reactions and possible hospitalization or surgery if complications occur explained. Additional  rare but real risk of cardiovascular event such as heart attack or ischemia/infarct of other organs off Plavix explained and need to seek urgent help if this occurs. Will communicate by phone or EMR with patient's prescribing provider that to confirm holding Plavix is reasonable in this case.      WU:[id number] H [address], [address], [place] [id number]

## 2016-01-21 NOTE — Patient Instructions (Signed)
You have been scheduled for a colonoscopy. Please follow written instructions given to you at your visit today.  Please pick up your prep supplies at the pharmacy within the next 1-3 days. If you use inhalers (even only as needed), please bring them with you on the day of your procedure. Your physician has requested that you go to www.startemmi.com and enter the access code given to you at your visit today. This web site gives a general overview about your procedure. However, you should still follow specific instructions given to you by our office regarding your preparation for the procedure.  You will be contacted by our office prior to your procedure for directions on holding your Plavix.  If you do not hear from our office 1 week prior to your scheduled procedure, please call [phone] to discuss.   If you are age 36 or older, your body mass index should be between 23-30. Your Body mass index is 29.75 kg/(m^2). If this is out of the aforementioned range listed, please consider follow up with your Primary Care Provider.  If you are age 36 or younger, your body mass index should be between 19-25. Your Body mass index is 29.75 kg/(m^2). If this is out of the aformentioned range listed, please consider follow up with your Primary Care Provider.

## 2016-01-30 ENCOUNTER — Telehealth: Payer: Self-pay | Admitting: Internal Medicine

## 2016-01-30 ENCOUNTER — Telehealth: Payer: Self-pay | Admitting: *Deleted

## 2016-01-30 NOTE — Telephone Encounter (Signed)
Patient placed on Suprep wait list. He has been advised.

## 2016-01-30 NOTE — Telephone Encounter (Signed)
Mr. Alex Yang may hold his Plavix for 5 days prior to GI procedure

## 2016-01-30 NOTE — Telephone Encounter (Signed)
RE: Alex Yang DOB: 06-19-1956 MRN: [id number]  Dear Dr Acie Fredrickson   wE have scheduled the above patient for a colonoscopy procedure. Our records show that he is on anticoagulation therapy.  Please advise as to whether the patient may come off his therapy of Plavix 5 days prior to the procedure, which is scheduled for 02/21/16. Please route your response to Dixon Boos, CMA.  Sincerely,  Dixon Boos

## 2016-01-31 NOTE — Telephone Encounter (Signed)
Left voicemail for patient to call back. 

## 2016-01-31 NOTE — Telephone Encounter (Signed)
Patient advised that per Dr Acie Fredrickson, he may hold Plavix 5 days prior to procedure. He verbalizes understanding.

## 2016-02-09 ENCOUNTER — Other Ambulatory Visit: Payer: Self-pay | Admitting: Cardiovascular Disease

## 2016-02-21 ENCOUNTER — Encounter: Payer: BLUE CROSS/BLUE SHIELD | Admitting: Internal Medicine

## 2016-02-27 ENCOUNTER — Encounter: Payer: Self-pay | Admitting: Family Medicine

## 2016-02-27 ENCOUNTER — Ambulatory Visit (INDEPENDENT_AMBULATORY_CARE_PROVIDER_SITE_OTHER): Payer: BLUE CROSS/BLUE SHIELD | Admitting: Family Medicine

## 2016-02-27 VITALS — BP 143/84 | HR 70 | Temp 98.0°F | Resp 16 | Ht 72.0 in | Wt 225.8 lb

## 2016-02-27 DIAGNOSIS — R310 Gross hematuria: Secondary | ICD-10-CM

## 2016-02-27 DIAGNOSIS — E118 Type 2 diabetes mellitus with unspecified complications: Secondary | ICD-10-CM

## 2016-02-27 DIAGNOSIS — E785 Hyperlipidemia, unspecified: Secondary | ICD-10-CM | POA: Diagnosis not present

## 2016-02-27 DIAGNOSIS — Z1211 Encounter for screening for malignant neoplasm of colon: Secondary | ICD-10-CM

## 2016-02-27 DIAGNOSIS — I10 Essential (primary) hypertension: Secondary | ICD-10-CM | POA: Diagnosis not present

## 2016-02-27 LAB — LIPID PANEL
Cholesterol: 108 mg/dL (ref 0–200)
HDL: 44 mg/dL (ref >39.00)
LDL Cholesterol: 41 mg/dL (ref 0–99)
NonHDL: 64.1
Total CHOL/HDL Ratio: 2
Triglycerides: 116 mg/dL (ref 0.0–149.0)
VLDL: 23.2 mg/dL (ref 0.0–40.0)

## 2016-02-27 LAB — HEMOGLOBIN A1C: Hgb A1c MFr Bld: 7.7 % — ABNORMAL HIGH (ref 4.6–6.5)

## 2016-02-27 MED ORDER — LORAZEPAM 0.5 MG PO TABS: ORAL_TABLET | ORAL | Status: DC

## 2016-02-27 NOTE — Progress Notes (Signed)
OFFICE VISIT  02/27/2016   CC:  Chief Complaint  Patient presents with  . Follow-up    Pt is fasting.    HPI:    Patient is a 60 y.o. Caucasian male who presents for 4 mo f/u DM 2, HTN, hyperlipidemia. After our last visit he saw Dr. Hilarie Fredrickson with GI and they plan on doing a screening colonoscopy.  He did not go see his urologist for his isolated episode of gross hematuria that he had just prior to his last visit with me.  UA with microscopy at last visit with me showed no blood or other abnormality.  He says he plans on following up with Dr. Tresa Moore on an annual basis, and he'll tell him about the episode when he follows up. He has had no further episodes of gross hematuria since that time.  Home bp monitoring well within normal range.  Compliant with meds.  Compliant with atorvastatin 61m qd, no side effects felt.  Glucoses: checks a few times a week.  Usually <130.  Eats diabetic diet. He exercises on a treadmill sometimes, walks regularly.  Feet: all toes in both feet feel numb and there is also some hurting that is hard to describe.  Past Medical History  Diagnosis Date  . NSTEMI (non-ST elevated myocardial infarction) ([facility] 07/2013    CABG x 22 Jul 2013  . [place] 07/[place]  . Hyperlipidemia   . Hypertension   . Paroxysmal atrial fibrillation ([place] 07/2013    Started post-op CABG, converted on amiodarone--amiodarone stopped after a couple months  . Chronic renal insufficiency, stage II (mild) 2014/2015    CrCl @ 60 ml/min  . Obesity, Class I, BMI 30-34.9   . Erectile dysfunction     Dr. MTresa Moore(urol is also doing prostate ca screening).  Refractory to medications.  Pt likely to get penile implant as of urol f/u 01/2015.  . Diabetic peripheral neuropathy associated with type 2 diabetes mellitus ([facility]     Past Surgical History  Procedure Laterality Date  . Shoulder surgery  1983    left; pins are in place  . Nasal fracture surgery  1979  . Coronary artery bypass  graft N/A 08/02/2013    Procedure: CORONARY ARTERY BYPASS GRAFTING (CABG);  Surgeon: EGrace Isaac MD;  [place]  Service: Open Heart Surgery;  Laterality: N/A;  Coronary artery bypass graft times five utilizing the left internal mammary artery and the right greater saphenous vein.  . Intraoperative transesophageal echocardiogram N/A 08/02/2013    Procedure: INTRAOPERATIVE TRANSESOPHAGEAL ECHOCARDIOGRAM;  Surgeon: EGrace Isaac MD;  [place]  Service: Open Heart Surgery;  Laterality: N/A;.  EF 40%, LVH and LV dilatation, inf wall hypokinesis (at the time of his MI)  . Endovein harvest of greater saphenous vein Right 08/02/2013    Procedure: ENDOVEIN HARVEST OF GREATER SAPHENOUS VEIN;  Surgeon: EGrace Isaac MD;  [place]  Service: Open Heart Surgery;  Laterality: Right;  . Left heart catheterization with coronary angiogram N/A 08/01/2013    Procedure: LEFT HEART CATHETERIZATION WITH CORONARY ANGIOGRAM;  Surgeon: MBlane Ohara MD;  [facility];  Service: Cardiovascular;  Laterality: N/A;    Outpatient Prescriptions Prior to Visit  Medication Sig Dispense Refill  . Alcohol Swabs PADS Please clean finger prior to checking blood sugar 100 each 12  . aspirin EC 81 MG EC tablet Take 1 tablet (81 mg total) by mouth daily.    .Marland Kitchenatorvastatin (LIPITOR) 20 MG  tablet Take 1 tablet (20 mg total) by mouth daily at 6 PM. 90 tablet 3  . citalopram (CELEXA) 20 MG tablet TAKE 1 TABLET (20 MG TOTAL) BY MOUTH DAILY. 30 tablet 11  . clopidogrel (PLAVIX) 75 MG tablet Take 1 tablet (75 mg total) by mouth daily with breakfast. 90 tablet 1  . gabapentin (NEURONTIN) 300 MG capsule Take 1 capsule (300 mg total) by mouth at bedtime. 90 capsule 3  . glucose blood test strip Use as instructed 100 each 12  . glucose monitoring kit (FREESTYLE) monitoring kit 1 each by Does not apply route as needed for other. Please choose glucometer of your choic 1 each 0  . LANCETS ULTRA  THIN 30G MISC 1 Units by Does not apply route 4 (four) times daily. Breakfast, Lunch, Dinner, Bedtime 100 each 3  . lisinopril (PRINIVIL,ZESTRIL) 5 MG tablet TAKE ONE TABLET BY MOUTH ONE TIME DAILY 30 tablet 3  . metFORMIN (GLUCOPHAGE) 1000 MG tablet Take 1 tablet (1,000 mg total) by mouth 2 (two) times daily with a meal. 180 tablet 1  . metoprolol (LOPRESSOR) 50 MG tablet Take 1 tablet (50 mg total) by mouth 2 (two) times daily. 180 tablet 3  . LORazepam (ATIVAN) 0.5 MG tablet 1-2 tabs po q8h prn anxiety 30 tablet 1  . zolpidem (AMBIEN) 10 MG tablet Take 1 tablet (10 mg total) by mouth at bedtime as needed for sleep. 30 tablet 5  . Na Sulfate-K Sulfate-Mg Sulf 17.5-3.13-1.6 GM/180ML SOLN Suprep-Use as directed (Patient not taking: Reported on 02/27/2016) 354 mL 0   No facility-administered medications prior to visit.    Allergies  Allergen Reactions  . Oxycodone Other (See Comments)    Psych/mental adverse effects  . Pioglitazone Swelling and Rash    ROS As per HPI  PE: Blood pressure 143/84, pulse 70, temperature 98 F (36.7 C), temperature source Oral, resp. rate 16, height 6' (1.829 m), weight 225 lb 12 oz (102.4 kg), SpO2 96 %. Gen: Alert, well appearing.  Patient is oriented to person, place, time, and situation. CV: RRR, no m/r/g.   LUNGS: CTA bilat, nonlabored resps, good aeration in all lung fields. EXT: no clubbing, cyanosis, or edema.  Foot exam - bilateral normal; no swelling, tenderness or skin or vascular lesions. Color and temperature is normal. Sensation is diminished with monofilament testing on plantar surfaces over metatarsal heads bilat. Peripheral pulses are palpable. Toenails are normal.   LABS:  Lab Results  Component Value Date   TSH 1.33 10/17/2015   Lab Results  Component Value Date   WBC 10.6* 10/17/2015   HGB 15.4 10/17/2015   HCT 46.5 10/17/2015   MCV 95.0 10/17/2015   PLT 275.0 10/17/2015   Lab Results  Component Value Date   CREATININE 0.93  10/17/2015   BUN 22 10/17/2015   NA 135 10/17/2015   K 4.6 10/17/2015   CL 101 10/17/2015   CO2 26 10/17/2015   Lab Results  Component Value Date   ALT 26 03/28/2015   AST 17 03/28/2015   ALKPHOS 50 03/28/2015   BILITOT 0.6 03/28/2015   Lab Results  Component Value Date   CHOL 102 03/28/2015   Lab Results  Component Value Date   HDL 38.10* 03/28/2015   Lab Results  Component Value Date   LDLCALC 38 03/28/2015   Lab Results  Component Value Date   TRIG 129.0 03/28/2015   Lab Results  Component Value Date   CHOLHDL 3 03/28/2015   Lab Results  Component Value Date   HGBA1C 7.3* 10/17/2015    IMPRESSION AND PLAN:  1) DM 2; historically pretty well controlled. HbA1c today. 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 His eye exam is UTD.  2) HTN; The current medical regimen is effective;  continue present plan and medications. Lytes/cr great 4 mo ago.  We'll recheck these at next f/u in 4 mo.  3) Hyperlipidemia: tolerating statin.  He is fasting for recheck lipid panel today.  4) Colon cancer screening: he had colonoscopy planned but there was a problem with insurance and the prep, so he had to cancel.  He has not rescheduled yet but he says he plans to.  5) Gross hematuria: isolated episode.  I encouraged him to make appt with his urologist at our last visit. However, since this has not recurred he has decided to simply mention it to Dr. Tresa Moore at his next routine f/u in the office.   An After Visit Summary was printed and given to the patient.  FOLLOW UP: Return in about 4 months (around 06/29/2016) for routine chronic illness f/u.  Signed:  Crissie Sickles, MD           02/27/2016

## 2016-02-27 NOTE — Progress Notes (Signed)
Pre visit review using our clinic review tool, if applicable. No additional management support is needed unless otherwise documented below in the visit note. 

## 2016-02-28 ENCOUNTER — Other Ambulatory Visit: Payer: Self-pay | Admitting: *Deleted

## 2016-02-28 MED ORDER — GLIPIZIDE ER 5 MG PO TB24: 5.0000 mg | ORAL_TABLET | Freq: Every day | ORAL | Status: DC

## 2016-04-10 ENCOUNTER — Other Ambulatory Visit: Payer: Self-pay | Admitting: *Deleted

## 2016-04-10 MED ORDER — ZOLPIDEM TARTRATE 10 MG PO TABS: 10.0000 mg | ORAL_TABLET | Freq: Every evening | ORAL | 5 refills | Status: DC | PRN

## 2016-04-10 NOTE — Telephone Encounter (Signed)
Rx faxed

## 2016-04-10 NOTE — Telephone Encounter (Signed)
[facility]  RF request for zolpidem LOV: 02/27/16 Next ov: 06/30/16 Last written: 10/04/15 #30 w/ 5RF  Please advise. Thanks.

## 2016-04-18 ENCOUNTER — Encounter: Payer: Self-pay | Admitting: Cardiovascular Disease

## 2016-05-05 ENCOUNTER — Ambulatory Visit (INDEPENDENT_AMBULATORY_CARE_PROVIDER_SITE_OTHER): Payer: Medicare Other | Admitting: Cardiovascular Disease

## 2016-05-05 ENCOUNTER — Encounter: Payer: Self-pay | Admitting: Cardiovascular Disease

## 2016-05-05 VITALS — BP 132/90 | HR 63 | Ht 72.0 in | Wt 235.4 lb

## 2016-05-05 DIAGNOSIS — E785 Hyperlipidemia, unspecified: Secondary | ICD-10-CM

## 2016-05-05 DIAGNOSIS — I10 Essential (primary) hypertension: Secondary | ICD-10-CM

## 2016-05-05 DIAGNOSIS — I2510 Atherosclerotic heart disease of native coronary artery without angina pectoris: Secondary | ICD-10-CM

## 2016-05-05 MED ORDER — LOSARTAN POTASSIUM 50 MG PO TABS: 50.0000 mg | ORAL_TABLET | Freq: Every day | ORAL | 11 refills | Status: DC

## 2016-05-05 NOTE — Progress Notes (Signed)
Alex Yang Date of Birth  [birth year]       [place] [address], [address], [place], [place]  [address]   [place], [place]  [address] [phone]     [phone]   Fax  [phone]    Fax [phone]  Problem List: 1. Coronary artery disease status post coronary artery bypass grafting ( Dec. 2014)  2. Type 2 diabetes mellitus 3. Hyperlipidemia 4. Atrial fibrillation - converted after being started on amiodarone.  History of Present Illness:  Mr. Alex Yang is a 60 year old gentleman who I have seen in the past. He was admitted on December 12 with a non-ST segment elevation myocardial infarction. He was found to have significant coronary artery disease and had coronary artery bypass grafting.  He developed paroxysmal atrial fibrillation following surgery. He was started on amiodarone. He converted to sinus rhythm and did not require cardioversion.    He was newly diagnosed with diabetes.   He is walking - 25 minutes yesterday.    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    December 08, 2013:  [name] is doing well.  Occasional  episodes of orthostatic hypotension. His glucose levels have been well-controlled.     Nov. 9, 2015:  [name] is doing well .  It has been almost one year since his coronary artery bypass grafting. 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   Aug. 10, 2016:  He is doing well.  Has some MSK pain in his sternum . glucoose is well controlled Is in a new band - 3 piece  Exercises regularly   Sept. 18, 2017:  New band,   Going to [place], [place]. To the [employer] " . Tribute festival to [employer] .  Doing well from a cardiac standpoint Has a cough - thinks its Lisinopril   Has bought a new guitar and a new house  Current Outpatient Prescriptions on File Prior to Visit  Medication Sig Dispense Refill    . Alcohol Swabs PADS Please clean finger prior to checking blood sugar 100 each 12  . aspirin EC 81 MG EC tablet Take 1 tablet (81 mg total) by mouth daily.    Marland Kitchen atorvastatin (LIPITOR) 20 MG tablet Take 1 tablet (20 mg total) by mouth daily at 6 PM. 90 tablet 3  . citalopram (CELEXA) 20 MG tablet TAKE 1 TABLET (20 MG TOTAL) BY MOUTH DAILY. 30 tablet 11  . clopidogrel (PLAVIX) 75 MG tablet Take 1 tablet (75 mg total) by mouth daily with breakfast. 90 tablet 1  . gabapentin (NEURONTIN) 300 MG capsule Take 1 capsule (300 mg total) by mouth at bedtime. 90 capsule 3  . glipiZIDE (GLUCOTROL XL) 5 MG 24 hr tablet Take 1 tablet (5 mg total) by mouth daily with breakfast. 30 tablet 4  . glucose blood test strip Use as instructed 100 each 12  . glucose monitoring kit (FREESTYLE) monitoring kit 1 each by Does not apply route as needed for other. Please choose glucometer of your choic 1 each 0  . LANCETS ULTRA THIN 30G MISC 1 Units by Does not apply route 4 (four) times daily. Breakfast, Lunch, Dinner, Bedtime 100 each 3  . lisinopril (PRINIVIL,ZESTRIL) 5 MG tablet TAKE ONE TABLET BY MOUTH ONE TIME DAILY 30 tablet 3  .  LORazepam (ATIVAN) 0.5 MG tablet 1-2 tabs po q8h prn anxiety 30 tablet 5  . metFORMIN (GLUCOPHAGE) 1000 MG tablet Take 1 tablet (1,000 mg total) by mouth 2 (two) times daily with a meal. 180 tablet 1  . metoprolol (LOPRESSOR) 50 MG tablet Take 1 tablet (50 mg total) by mouth 2 (two) times daily. 180 tablet 3  . zolpidem (AMBIEN) 10 MG tablet Take 1 tablet (10 mg total) by mouth at bedtime as needed for sleep. 30 tablet 5   No current facility-administered medications on file prior to visit.     Allergies  Allergen Reactions  . Oxycodone Other (See Comments)    Psych/mental adverse effects  . Pioglitazone Swelling and Rash    Past Medical History:  Diagnosis Date  . Chronic renal insufficiency, stage II (mild) 2014/2015   CrCl @ 60 ml/min  . Diabetes ([place]) 07/2013  . Diabetic  peripheral neuropathy associated with type 2 diabetes mellitus ([place])   . Erectile dysfunction    Dr. Tresa Moore (urol is also doing prostate ca screening).  Refractory to medications.  Pt likely to get penile implant as of urol f/u 01/2015.  Marland Kitchen Hyperlipidemia   . Hypertension   . NSTEMI (non-ST elevated myocardial infarction) [facility]) 07/2013   CABG x 22 Jul 2013  . Obesity, Class I, BMI 30-34.9   . Paroxysmal atrial fibrillation ([place]) 07/2013   Started post-op CABG, converted on amiodarone--amiodarone stopped after a couple months    Past Surgical History:  Procedure Laterality Date  . CORONARY ARTERY BYPASS GRAFT N/A 08/02/2013   Procedure: CORONARY ARTERY BYPASS GRAFTING (CABG);  Surgeon: Grace Isaac, MD;  Location: [facility];  Service: Open Heart Surgery;  Laterality: N/A;  Coronary artery bypass graft times five utilizing the left internal mammary artery and the right greater saphenous vein.  Marland Kitchen ENDOVEIN HARVEST OF GREATER SAPHENOUS VEIN Right 08/02/2013   Procedure: ENDOVEIN HARVEST OF GREATER SAPHENOUS VEIN;  Surgeon: Grace Isaac, MD;  Location: [facility];  Service: Open Heart Surgery;  Laterality: Right;  . INTRAOPERATIVE TRANSESOPHAGEAL ECHOCARDIOGRAM N/A 08/02/2013   Procedure: INTRAOPERATIVE TRANSESOPHAGEAL ECHOCARDIOGRAM;  Surgeon: Grace Isaac, MD;  Location: [facility];  Service: Open Heart Surgery;  Laterality: N/A;.  EF 40%, LVH and LV dilatation, inf wall hypokinesis (at the time of his MI)  . LEFT HEART CATHETERIZATION WITH CORONARY ANGIOGRAM N/A 08/01/2013   Procedure: LEFT HEART CATHETERIZATION WITH CORONARY ANGIOGRAM;  Surgeon: Blane Ohara, MD;  Location: [facility] C[facility];  Service: Cardiovascular;  Laterality: N/A;  . NASAL FRACTURE SURGERY  1979  . SHOULDER SURGERY  1983   left; pins are in place  . TRANSTHORACIC ECHOCARDIOGRAM  07/2013   EF 40%+ wall motion abnl--in the context of acute NSTEMI    History  Smoking Status  . Never Smoker  Smokeless Tobacco  . Never Used     History  Alcohol Use No    Family History  Problem Relation Age of Onset  . Heart attack Mother   . Heart disease Mother   . Diabetes Mother   . Heart disease Father     Reviw of Systems:  Reviewed in the HPI.  All other systems are negative.  Physical Exam: Blood pressure 132/90, pulse 63, height 6' (1.829 m), weight 235 lb 6.4 oz (106.8 kg). General: Well developed, well nourished, in no acute distress.  Head: Normocephalic, atraumatic, sclera non-icteric, mucus membranes are moist,   Neck: Supple. Carotids are 2 + without bruits. No JVD  Lungs: Clear   Heart: RR,   Sternum is healing well.   Abdomen: Soft, non-tender, non-distended with normal bowel sounds.  Msk:  Strength and tone are normal   Extremities: No clubbing or cyanosis. No edema.  Distal pedal pulses are 2+ and equal .     Neuro: CN II - XII intact.  Alert and oriented X 3.   Psych:  Normal   ECG: NSR at 63.   1st degree AV block .   Otherwise normal ECG  Assessment / Plan:   1. Coronary artery disease status post coronary artery bypass grafting ( Dec. 2014)  -  he's doing very well. He's not had any episodes of angina.  2. Type 2 diabetes mellitus -   3. Hyperlipidemia - we'll check fasting lipids today.  4. Atrial fibrillation - converted after being started on amiodarone. - He's maintaining normal sinus rhythm. Continue current medications.    Mertie Moores, MD  05/05/2016 3:32 PM    [facility] [place],  [place], [place]  [address] Pager [phone] Phone: [phone]; Fax: [phone]

## 2016-05-05 NOTE — Patient Instructions (Signed)
Medication Instructions:  STOP Lisinopril START Losartan (Cozaar) 50 mg once daily   Labwork: Your physician recommends that you return for lab work in: 3 weeks for basic metabolic panel  Your physician recommends that you return for lab work in: 1 year on the day of or a few days before your office visit with Dr. Acie Fredrickson.  You will need to FAST for this appointment - nothing to eat or drink after midnight the night before except water.    Testing/Procedures: None Ordered   Follow-Up: Your physician wants you to follow-up in: 1 year with Dr. Acie Fredrickson.  You will receive a reminder letter in the mail two months in advance. If you don't receive a letter, please call our office to schedule the follow-up appointment.   If you need a refill on your cardiac medications before your next appointment, please call your pharmacy.   Thank you for choosing [facility]! Christen Bame, RN [phone]

## 2016-05-09 DIAGNOSIS — Z23 Encounter for immunization: Secondary | ICD-10-CM | POA: Diagnosis not present

## 2016-05-19 ENCOUNTER — Other Ambulatory Visit: Payer: Self-pay

## 2016-05-19 MED ORDER — GLIPIZIDE ER 5 MG PO TB24: 5.0000 mg | ORAL_TABLET | Freq: Every day | ORAL | 0 refills | Status: DC

## 2016-05-19 MED ORDER — METFORMIN HCL 1000 MG PO TABS: 1000.0000 mg | ORAL_TABLET | Freq: Two times a day (BID) | ORAL | 0 refills | Status: DC

## 2016-05-19 NOTE — Telephone Encounter (Signed)
Medication filled.  

## 2016-05-19 NOTE — Telephone Encounter (Signed)
Medication refilled

## 2016-05-27 ENCOUNTER — Other Ambulatory Visit: Payer: Medicare Other

## 2016-06-05 ENCOUNTER — Other Ambulatory Visit: Payer: Self-pay | Admitting: Cardiovascular Disease

## 2016-06-18 ENCOUNTER — Telehealth: Payer: Self-pay | Admitting: Cardiovascular Disease

## 2016-06-18 NOTE — Telephone Encounter (Signed)
New Message:     Pt wants to know if there is some where close he can get his lab work,he lives in [place].

## 2016-06-18 NOTE — Telephone Encounter (Signed)
Left message to call back Pt can try having labs drawn at [facility] or [facility] in [place].

## 2016-06-19 ENCOUNTER — Other Ambulatory Visit: Payer: Medicare Other

## 2016-06-20 NOTE — Telephone Encounter (Signed)
Advised patient to get bmet at [facility] in [place] next week.  He verbalized understanding and thanked me for the call.

## 2016-06-26 ENCOUNTER — Other Ambulatory Visit: Payer: Self-pay

## 2016-06-26 MED ORDER — CITALOPRAM HYDROBROMIDE 20 MG PO TABS: ORAL_TABLET | ORAL | 0 refills | Status: DC

## 2016-06-26 MED ORDER — METOPROLOL TARTRATE 50 MG PO TABS: 50.0000 mg | ORAL_TABLET | Freq: Two times a day (BID) | ORAL | 3 refills | Status: DC

## 2016-06-26 NOTE — Telephone Encounter (Signed)
refill sent 

## 2016-06-30 ENCOUNTER — Encounter: Payer: Self-pay | Admitting: Family Medicine

## 2016-06-30 ENCOUNTER — Ambulatory Visit (INDEPENDENT_AMBULATORY_CARE_PROVIDER_SITE_OTHER): Payer: Medicare Other | Admitting: Family Medicine

## 2016-06-30 VITALS — BP 113/81 | HR 76 | Temp 95.9°F | Resp 16 | Wt 230.0 lb

## 2016-06-30 DIAGNOSIS — F5101 Primary insomnia: Secondary | ICD-10-CM

## 2016-06-30 DIAGNOSIS — I2510 Atherosclerotic heart disease of native coronary artery without angina pectoris: Secondary | ICD-10-CM

## 2016-06-30 DIAGNOSIS — R05 Cough: Secondary | ICD-10-CM | POA: Diagnosis not present

## 2016-06-30 DIAGNOSIS — I10 Essential (primary) hypertension: Secondary | ICD-10-CM

## 2016-06-30 DIAGNOSIS — E118 Type 2 diabetes mellitus with unspecified complications: Secondary | ICD-10-CM

## 2016-06-30 DIAGNOSIS — T887XXA Unspecified adverse effect of drug or medicament, initial encounter: Secondary | ICD-10-CM

## 2016-06-30 DIAGNOSIS — T50905A Adverse effect of unspecified drugs, medicaments and biological substances, initial encounter: Secondary | ICD-10-CM

## 2016-06-30 DIAGNOSIS — T464X5A Adverse effect of angiotensin-converting-enzyme inhibitors, initial encounter: Secondary | ICD-10-CM

## 2016-06-30 MED ORDER — TRAZODONE HCL 50 MG PO TABS: ORAL_TABLET | ORAL | 3 refills | Status: DC

## 2016-06-30 NOTE — Progress Notes (Signed)
Pre visit review using our clinic review tool, if applicable. No additional management support is needed unless otherwise documented below in the visit note. 

## 2016-06-30 NOTE — Progress Notes (Signed)
OFFICE VISIT  06/30/2016   CC:  Chief Complaint  Patient presents with  . Follow-up    discuss changing to trazodone fronm ambien, and labs   HPI:    Patient is a 60 y.o. Caucasian male who presents for 4 mo f/u DM 2, HTN, CRI stage II (GFR @60 ). Reports having weird dreams, sleep walking, and sleep driving when taking zolpidem.  It also increased his appetite and may even have caused recent poorer control of his DM due to excessive eating.   He was having cough on lisinopril so cardiologist changed him to losartan 59m qd.  Needs f/u BMET today.  BP staying normal at home. Glucoses: much improved since getting on glipizide ER after last f/u visit.   Past Medical History:  Diagnosis Date  . Chronic renal insufficiency, stage II (mild) 2014/2015   CrCl @ 60 ml/min  . Diabetes ([place] 07/2013  . Diabetic peripheral neuropathy associated with type 2 diabetes mellitus ([facility]   . Erectile dysfunction    Dr. MTresa Moore(urol is also doing prostate ca screening).  Refractory to medications.  Pt likely to get penile implant as of urol f/u 01/2015.  .Johney Mainehematuria 2017   Pt is to bring this up with his urologist as of 06/2016.  .Marland KitchenHyperlipidemia   . Hypertension   . NSTEMI (non-ST elevated myocardial infarction) ([facility] 07/2013   CABG x 22 Jul 2013  . Obesity, Class I, BMI 30-34.9   . Paroxysmal atrial fibrillation ([facility] 07/2013   Started post-op CABG, converted on amiodarone--amiodarone stopped after a couple months    Past Surgical History:  Procedure Laterality Date  . CORONARY ARTERY BYPASS GRAFT N/A 08/02/2013   Procedure: CORONARY ARTERY BYPASS GRAFTING (CABG);  Surgeon: EGrace Isaac MD;  [facility] [facility]  Service: Open Heart Surgery;  Laterality: N/A;  Coronary artery bypass graft times five utilizing the left internal mammary artery and the right greater saphenous vein.  .Marland KitchenENDOVEIN HARVEST OF GREATER SAPHENOUS VEIN Right 08/02/2013   Procedure: ENDOVEIN HARVEST OF GREATER  SAPHENOUS VEIN;  Surgeon: EGrace Isaac MD;  [place] [facility]  Service: Open Heart Surgery;  Laterality: Right;  . INTRAOPERATIVE TRANSESOPHAGEAL ECHOCARDIOGRAM N/A 08/02/2013   Procedure: INTRAOPERATIVE TRANSESOPHAGEAL ECHOCARDIOGRAM;  Surgeon: EGrace Isaac MD;  [facility]  Service: Open Heart Surgery;  Laterality: N/A;.  EF 40%, LVH and LV dilatation, inf wall hypokinesis (at the time of his MI)  . LEFT HEART CATHETERIZATION WITH CORONARY ANGIOGRAM N/A 08/01/2013   Procedure: LEFT HEART CATHETERIZATION WITH CORONARY ANGIOGRAM;  Surgeon: MBlane Ohara MD;  [facility];  Service: Cardiovascular;  Laterality: N/A;  . NASAL FRACTURE SURGERY  1979  . SHOULDER SURGERY  1983   left; pins are in place  . TRANSTHORACIC ECHOCARDIOGRAM  07/2013   EF 40%+ wall motion abnl--in the context of acute NSTEMI    Outpatient Medications Prior to Visit  Medication Sig Dispense Refill  . Alcohol Swabs PADS Please clean finger prior to checking blood sugar 100 each 12  . aspirin EC 81 MG EC tablet Take 1 tablet (81 mg total) by mouth daily.    .Marland Kitchenatorvastatin (LIPITOR) 20 MG tablet Take 1 tablet (20 mg total) by mouth daily at 6 PM. 90 tablet 3  . citalopram (CELEXA) 20 MG tablet TAKE 1 TABLET (20 MG TOTAL) BY MOUTH DAILY. 30 tablet 0  . clopidogrel (PLAVIX) 75 MG tablet Take 1 tablet (75 mg total) by mouth daily with breakfast.  90 tablet 1  . gabapentin (NEURONTIN) 300 MG capsule Take 1 capsule (300 mg total) by mouth at bedtime. 90 capsule 3  . glipiZIDE (GLUCOTROL XL) 5 MG 24 hr tablet Take 1 tablet (5 mg total) by mouth daily with breakfast. 90 tablet 0  . glucose blood test strip Use as instructed 100 each 12  . glucose monitoring kit (FREESTYLE) monitoring kit 1 each by Does not apply route as needed for other. Please choose glucometer of your choic 1 each 0  . LANCETS ULTRA THIN 30G MISC 1 Units by Does not apply route 4 (four) times daily. Breakfast, Lunch, Dinner, Bedtime 100  each 3  . losartan (COZAAR) 50 MG tablet Take 1 tablet (50 mg total) by mouth daily. 30 tablet 11  . metFORMIN (GLUCOPHAGE) 1000 MG tablet Take 1 tablet (1,000 mg total) by mouth 2 (two) times daily with a meal. 180 tablet 0  . metoprolol (LOPRESSOR) 50 MG tablet Take 1 tablet (50 mg total) by mouth 2 (two) times daily. 180 tablet 3  . LORazepam (ATIVAN) 0.5 MG tablet 1-2 tabs po q8h prn anxiety (Patient not taking: Reported on 06/30/2016) 30 tablet 5  . zolpidem (AMBIEN) 10 MG tablet Take 1 tablet (10 mg total) by mouth at bedtime as needed for sleep. 30 tablet 5   No facility-administered medications prior to visit.     Allergies  Allergen Reactions  . Oxycodone Other (See Comments)    Psych/mental adverse effects  . Pioglitazone Swelling and Rash    ROS As per HPI  PE: Blood pressure 113/81, pulse 76, temperature (!) 95.9 F (35.5 C), temperature source Oral, resp. rate 16, weight 230 lb (104.3 kg), SpO2 97 %. Gen: Alert, well appearing.  Patient is oriented to person, place, time, and situation. AFFECT: pleasant, lucid thought and speech.   LABS:  Lab Results  Component Value Date   TSH 1.33 10/17/2015   Lab Results  Component Value Date   WBC 10.6 (H) 10/17/2015   HGB 15.4 10/17/2015   HCT 46.5 10/17/2015   MCV 95.0 10/17/2015   PLT 275.0 10/17/2015   Lab Results  Component Value Date   CREATININE 0.93 10/17/2015   BUN 22 10/17/2015   NA 135 10/17/2015   K 4.6 10/17/2015   CL 101 10/17/2015   CO2 26 10/17/2015   Lab Results  Component Value Date   ALT 26 03/28/2015   AST 17 03/28/2015   ALKPHOS 50 03/28/2015   BILITOT 0.6 03/28/2015   Lab Results  Component Value Date   CHOL 108 02/27/2016   Lab Results  Component Value Date   HDL 44.00 02/27/2016   Lab Results  Component Value Date   LDLCALC 41 02/27/2016   Lab Results  Component Value Date   TRIG 116.0 02/27/2016   Lab Results  Component Value Date   CHOLHDL 2 02/27/2016   Lab Results   Component Value Date   HGBA1C 7.7 (H) 02/27/2016   IMPRESSION AND PLAN:  1) DM 2, control improving. HbA1c today.  2) HTN; The current medical regimen is effective;  continue present plan and medications.  3) ACE-I cough: resolved with switch to ARB.  Will do f/u BMET to check lytes/cr.  4) Parasomnia behavior on zolpidem: pt has stopped this med now.  5) Insomnia: will start trial of trazodone 37m, 1-2 tabs qhs prn.  Therapeutic expectations and side effect profile of medication discussed today.  Patient's questions answered.  An After Visit Summary was printed  and given to the patient.  FOLLOW UP: Return in about 4 months (around 10/28/2016) for routine chronic illness f/u.  Signed:  Crissie Sickles, MD           06/30/2016

## 2016-07-01 LAB — BASIC METABOLIC PANEL
BUN: 25 mg/dL — ABNORMAL HIGH (ref 6–23)
CO2: 30 mEq/L (ref 19–32)
Calcium: 10 mg/dL (ref 8.4–10.5)
Chloride: 103 mEq/L (ref 96–112)
Creatinine, Ser: 1.12 mg/dL (ref 0.40–1.50)
GFR: 70.62 mL/min (ref >60.00)
Glucose, Bld: 120 mg/dL — ABNORMAL HIGH (ref 70–99)
Potassium: 4.7 mEq/L (ref 3.5–5.1)
Sodium: 141 mEq/L (ref 135–145)

## 2016-07-01 LAB — HEMOGLOBIN A1C: Hgb A1c MFr Bld: 6.9 % — ABNORMAL HIGH (ref 4.6–6.5)

## 2016-07-02 NOTE — Telephone Encounter (Signed)
BMP looks good. After changing to Losartan

## 2016-07-02 NOTE — Telephone Encounter (Signed)
Forwarding to Dr. Acie Fredrickson for review of recent bmet since patient switched from lisinopril to losartan 9/18.

## 2016-08-05 ENCOUNTER — Other Ambulatory Visit: Payer: Self-pay | Admitting: Family Medicine

## 2016-08-05 NOTE — Telephone Encounter (Signed)
RF request for citalopram LOV: 06/30/16 Next ov: 10/29/16 Last written: 06/26/16 #30 w/ [id number]

## 2016-08-05 NOTE — Telephone Encounter (Signed)
Patient was requesting ativan but he has additional refills.  I told patient to contact the pharmacy.

## 2016-08-15 ENCOUNTER — Other Ambulatory Visit: Payer: Self-pay | Admitting: Family Medicine

## 2016-08-15 NOTE — Telephone Encounter (Signed)
RF request for metformin LOV: 06/30/16 Next ov: 10/29/16 Last written: 05/19/16 #180 w/ 0RF

## 2016-08-23 ENCOUNTER — Other Ambulatory Visit: Payer: Self-pay | Admitting: Cardiovascular Disease

## 2016-08-23 ENCOUNTER — Other Ambulatory Visit: Payer: Self-pay | Admitting: Family Medicine

## 2016-09-05 ENCOUNTER — Telehealth: Payer: Self-pay | Admitting: *Deleted

## 2016-09-05 NOTE — Telephone Encounter (Signed)
PLEASE NOTE: All timestamps contained within this report are represented as [place] Standard Time. CONFIDENTIALTY NOTICE: This fax transmission is intended only for the addressee. It contains information that is legally privileged, confidential or otherwise protected from use or disclosure. If you are not the intended recipient, you are strictly prohibited from reviewing, disclosing, copying using or disseminating any of this information or taking any action in reliance on or regarding this information. If you have received this fax in error, please notify us immediately by telephone so that we can arrange for its return to [place]. Phone: [phone], Toll-Free: [phone], Fax: [phone] Page: 1 of 2 Call Id: [id number] [place] Patient Name: Alex Yang Gender: Male DOB: 31-Jul-1956 Age: 61 Y 67 M 23 D Return Phone Number: [phone] (Primary), [phone] (Secondary) Address: [address] City/State/Zip: [place] [address] Client [facility] - Client Client Site [facility] Night Physician Crissie Sickles - MD Contact Type Call Who Is Calling Patient / Member / Family / Caregiver Call Type Triage / Clinical Relationship To Patient Self Return Phone Number [phone] (Primary) Chief Complaint Cough Reason for Call Symptomatic / Request for [place] states has a cough, had open heart surgery, asking what meds he can take, no fever PreDisposition Call Doctor Translation No Nurse Assessment Nurse: Brock Bad, RN, Barnetta Chapel Date/Time (Eastern Time): 09/04/2016 11:31:34 AM Confirm and document reason for call. If symptomatic, describe symptoms. ---Caller states that he has a cough. He has been using honey, lemon, vinegar and water. He had open heart surgery 3 years ago and not sure what he can take. The  cough started Monday and he has mucus in it. No fever. Does the patient have any new or worsening symptoms? ---Yes Will a triage be completed? ---Yes Related visit to physician within the last 2 weeks? ---No Does the PT have any chronic conditions? (i.e. diabetes, asthma, etc.) ---Yes List chronic conditions. ---hypertension, diabetes, hx open heart surgery, high cholesterol Is this a behavioral health or substance abuse call? ---No Guidelines Guideline Title Affirmed Question Affirmed Notes Nurse Date/Time Eilene Ghazi Time) Cough - Acute Productive Cough (all triage questions negative) Brock Bad, RN, Barnetta Chapel 09/04/2016 11:35:06 AM Disp. Time Eilene Ghazi Time) Disposition Final User 09/04/2016 11:43:26 AM Home Care Yes Brock Bad, RN, Ledell Noss Understands: Yes Disagree/Comply: Comply PLEASE NOTE: All timestamps contained within this report are represented as [place] Standard Time. CONFIDENTIALTY NOTICE: This fax transmission is intended only for the addressee. It contains information that is legally privileged, confidential or otherwise protected from use or disclosure. If you are not the intended recipient, you are strictly prohibited from reviewing, disclosing, copying using or disseminating any of this information or taking any action in reliance on or regarding this information. If you have received this fax in error, please notify us immediately by telephone so that we can arrange for its return to [place]. Phone: [phone], Toll-Free: [phone], Fax: [phone] Page: 2 of 2 Call Id: [id number] Care Advice Given Per Guideline HOME CARE: You should be able to treat this at home. REASSURANCE: * It doesn't sound like a serious cough. COUGH MEDICINES: - OTC COUGH SYRUPS: The most common cough suppressant in OTC cough medications is dextromethorphan. Often the letters 'DM' appear in the name. - OTC COUGH DROPS: Cough drops can help a lot, especially for mild coughs. They reduce coughing by  soothing your irritated throat and removing  that tickle sensation in the back of the throat. Cough drops also have the advantage of portability - you can carry them with you. - HOME REMEDY - HONEY: This old home remedy has been shown to help decrease coughing at night. The adult dosage is 2 teaspoons (10 ml) at bedtime. Honey should not be given to infants under one year of age. HUMIDIFIER: If the air is dry, use a humidifier in the bedroom. (Reason: dry air makes coughs worse) PREVENT DEHYDRATION: * Drink adequate liquids. CALL BACK IF: * Cough lasts over 3 weeks * Continuous coughing persists over 2 hours after cough treatment * Difficulty breathing occurs * Fever over 103 F (39.4 C) * Fever lasts over 3 days * You become worse. CARE ADVICE given per Cough - Acute Productive (Adult) guideline. CAUTION - DEXTROMETHORPHAN: * Do not try to completely suppress coughs that produce mucus and phlegm. Remember that coughing is helpful in bringing up mucus from the lungs and preventing pneumonia. EXPECTED COURSE: Viral bronchitis causes a cough for 1 to 3 weeks. Sometimes you may cough up lots of phlegm (mucus). The mucus can normally be white, gray, yellow or green. OTC COUGH SYRUP - DEXTROMETHORPHAN: * Cough syrups containing the cough suppressant dextromethorphan (DM) may help decrease your cough. Cough syrups work best for coughs that keep you awake at night. They can also sometimes help in the late stages of a respiratory infection when the cough is dry and hacking. They can be used along with cough drops.

## 2016-09-05 NOTE — Telephone Encounter (Signed)
Please advise. Thanks.  

## 2016-09-05 NOTE — Telephone Encounter (Signed)
Agree with advice

## 2016-09-10 ENCOUNTER — Encounter: Payer: Self-pay | Admitting: Family Medicine

## 2016-09-10 ENCOUNTER — Ambulatory Visit (INDEPENDENT_AMBULATORY_CARE_PROVIDER_SITE_OTHER): Payer: Medicare HMO | Admitting: Family Medicine

## 2016-09-10 VITALS — BP 126/88 | HR 72 | Temp 97.7°F | Resp 16 | Ht 72.0 in | Wt 238.0 lb

## 2016-09-10 DIAGNOSIS — J209 Acute bronchitis, unspecified: Secondary | ICD-10-CM

## 2016-09-10 MED ORDER — ALBUTEROL SULFATE HFA 108 (90 BASE) MCG/ACT IN AERS: 2.0000 | INHALATION_SPRAY | RESPIRATORY_TRACT | 0 refills | Status: DC | PRN

## 2016-09-10 NOTE — Progress Notes (Signed)
Pre visit review using our clinic review tool, if applicable. No additional management support is needed unless otherwise documented below in the visit note. 

## 2016-09-10 NOTE — Progress Notes (Signed)
OFFICE VISIT  09/10/2016   CC:  Chief Complaint  Patient presents with  . Cough    x 2 weeks, has been productive for the last 2 days since starting mucinex.    HPI:    Patient is a 61 y.o. Caucasian male who presents for cough. Onset about 2 weeks ago, seems to be getting better lately.  Started with nasal congestion/sneezing as well. Mucinex helping make cough productive.  Delsym: not sure if helping or not b/c he used home remedy (hot toddy) at same time. No fever.  No chest tightness and no SOB. Has never been a smoker.  Past Medical History:  Diagnosis Date  . Chronic renal insufficiency, stage II (mild) 2014/2015   CrCl @ 60 ml/min  . Diabetes ([place]) 07/2013  . Diabetic peripheral neuropathy associated with type 2 diabetes mellitus ([place])   . Erectile dysfunction    Dr. Tresa Moore (urol is also doing prostate ca screening).  Refractory to medications.  Pt likely to get penile implant as of urol f/u 01/2015.  Johney Maine hematuria 2017   Pt is to bring this up with his urologist as of 06/2016.  Marland Kitchen Hyperlipidemia   . Hypertension   . NSTEMI (non-ST elevated myocardial infarction) [facility]) 07/2013   CABG x 22 Jul 2013  . Obesity, Class I, BMI 30-34.9   . Paroxysmal atrial fibrillation ([place]) 07/2013   Started post-op CABG, converted on amiodarone--amiodarone stopped after a couple months    Past Surgical History:  Procedure Laterality Date  . CORONARY ARTERY BYPASS GRAFT N/A 08/02/2013   Procedure: CORONARY ARTERY BYPASS GRAFTING (CABG);  Surgeon: Grace Isaac, MD;  Location: [place];  Service: Open Heart Surgery;  Laterality: N/A;  Coronary artery bypass graft times five utilizing the left internal mammary artery and the right greater saphenous vein.  Marland Kitchen ENDOVEIN HARVEST OF GREATER SAPHENOUS VEIN Right 08/02/2013   Procedure: ENDOVEIN HARVEST OF GREATER SAPHENOUS VEIN;  Surgeon: Grace Isaac, MD;  Location: [place];  Service: Open Heart Surgery;  Laterality: Right;  . INTRAOPERATIVE  TRANSESOPHAGEAL ECHOCARDIOGRAM N/A 08/02/2013   Procedure: INTRAOPERATIVE TRANSESOPHAGEAL ECHOCARDIOGRAM;  Surgeon: Grace Isaac, MD;  Location: [place];  Service: Open Heart Surgery;  Laterality: N/A;.  EF 40%, LVH and LV dilatation, inf wall hypokinesis (at the time of his MI)  . LEFT HEART CATHETERIZATION WITH CORONARY ANGIOGRAM N/A 08/01/2013   Procedure: LEFT HEART CATHETERIZATION WITH CORONARY ANGIOGRAM;  Surgeon: Blane Ohara, MD;  Location: [facility] C[facility] LAB;  Service: Cardiovascular;  Laterality: N/A;  . NASAL FRACTURE SURGERY  1979  . SHOULDER SURGERY  1983   left; pins are in place  . TRANSTHORACIC ECHOCARDIOGRAM  07/2013   EF 40%+ wall motion abnl--in the context of acute NSTEMI    Outpatient Medications Prior to Visit  Medication Sig Dispense Refill  . Alcohol Swabs PADS Please clean finger prior to checking blood sugar 100 each 12  . aspirin EC 81 MG EC tablet Take 1 tablet (81 mg total) by mouth daily.    Marland Kitchen atorvastatin (LIPITOR) 20 MG tablet Take 1 tablet (20 mg total) by mouth daily at 6 PM. 90 tablet 3  . citalopram (CELEXA) 20 MG tablet TAKE 1 TABLET (20 MG TOTAL) BY MOUTH DAILY. 30 tablet 11  . clopidogrel (PLAVIX) 75 MG tablet TAKE 1 TABLET (75 MG TOTAL) BY MOUTH DAILY WITH BREAKFAST. 90 tablet 2  . gabapentin (NEURONTIN) 300 MG capsule Take 1 capsule (300 mg total) by mouth at bedtime. [place]  capsule 3  . glipiZIDE (GLUCOTROL XL) 5 MG 24 hr tablet TAKE 1 TABLET BY MOUTH EVERY DAY WITH BREAKFAST 90 tablet 1  . glucose blood test strip Use as instructed 100 each 12  . glucose monitoring kit (FREESTYLE) monitoring kit 1 each by Does not apply route as needed for other. Please choose glucometer of your choic 1 each 0  . LANCETS ULTRA THIN 30G MISC 1 Units by Does not apply route 4 (four) times daily. Breakfast, Lunch, Dinner, Bedtime 100 each 3  . LORazepam (ATIVAN) 0.5 MG tablet 1-2 tabs po q8h prn anxiety 30 tablet 5  . metFORMIN (GLUCOPHAGE) 1000 MG tablet TAKE 1 TABLET  (1,000 MG TOTAL) BY MOUTH 2 (TWO) TIMES DAILY WITH A MEAL. 180 tablet 1  . metoprolol (LOPRESSOR) 50 MG tablet Take 1 tablet (50 mg total) by mouth 2 (two) times daily. 180 tablet 3  . traZODone (DESYREL) 50 MG tablet 1-2 tabs po qhs prn insomnia 60 tablet 3  . losartan (COZAAR) 50 MG tablet Take 1 tablet (50 mg total) by mouth daily. 30 tablet 11   No facility-administered medications prior to visit.     Allergies  Allergen Reactions  . Oxycodone Other (See Comments)    Psych/mental adverse effects  . Pioglitazone Swelling and Rash    ROS As per HPI  PE: Blood pressure 126/88, pulse 72, temperature 97.7 F (36.5 C), temperature source Temporal, resp. rate 16, height 6' (1.829 m), weight 238 lb (108 kg), SpO2 96 %. VS: noted--normal. Gen: alert, NAD, NONTOXIC APPEARING. HEENT: eyes without injection, drainage, or swelling.  Ears: EACs clear, TMs with normal light reflex and landmarks.  Nose: Clear rhinorrhea, with some dried, crusty exudate adherent to mildly injected mucosa.  No purulent d/c.  No paranasal sinus TTP.  No facial swelling.  Throat and mouth without focal lesion.  No pharyngial swelling, erythema, or exudate.   Neck: supple, no LAD.   LUNGS: Slight coarse end exp wheeze diffusely, with mildly prolonged exp phase, nonlabored resps.   CV: RRR, no m/r/g. EXT: no c/c/e SKIN: no rash  LABS:    Chemistry      Component Value Date/Time   NA 141 06/30/2016 1534   K 4.7 06/30/2016 1534   CL 103 06/30/2016 1534   CO2 30 06/30/2016 1534   BUN 25 (H) 06/30/2016 1534   CREATININE 1.12 06/30/2016 1534      Component Value Date/Time   CALCIUM 10.0 06/30/2016 1534   ALKPHOS 50 03/28/2015 0908   AST 17 03/28/2015 0908   ALT 26 03/28/2015 0908   BILITOT 0.6 03/28/2015 0908     Lab Results  Component Value Date   HGBA1C 6.9 (H) 06/30/2016    IMPRESSION AND PLAN:  Acute bronchitis, suspect viral etiology.  Mild RAD component. It has started to  resolve. Risks>benefits of systemic steroids at this time. I rx'd him an albuterol HFA, 2 puffs q4h prn. Continue other current symptomatic care prn.  An After Visit Summary was printed and given to the patient.  FOLLOW UP: Return if symptoms worsen or fail to improve.  Signed:  Crissie Sickles, MD           09/10/2016

## 2016-09-16 ENCOUNTER — Telehealth: Payer: Self-pay | Admitting: Family Medicine

## 2016-09-16 MED ORDER — PREDNISONE 20 MG PO TABS: ORAL_TABLET | ORAL | 0 refills | Status: DC

## 2016-09-16 NOTE — Telephone Encounter (Signed)
Please advise. Thanks.  

## 2016-09-16 NOTE — Telephone Encounter (Signed)
Prednisone eRx'd. 

## 2016-09-16 NOTE — Telephone Encounter (Signed)
Pt is still having a consistent cough since his visit last week. Wanted to see if there was something that could be calling in for him please.  [facility] [place].

## 2016-09-17 NOTE — Telephone Encounter (Signed)
Detailed message left on patients voice mail per DPR.

## 2016-10-08 ENCOUNTER — Other Ambulatory Visit: Payer: Self-pay | Admitting: *Deleted

## 2016-10-08 MED ORDER — LORAZEPAM 0.5 MG PO TABS: ORAL_TABLET | ORAL | 5 refills | Status: DC

## 2016-10-08 NOTE — Telephone Encounter (Signed)
Fax from [facility].  RF request for lorazepam LOV: 06/30/16  Next ov: 10/29/16 Last written: 02/27/16 #30 w/ 5RF  Please advise. Thanks.

## 2016-10-10 NOTE — Telephone Encounter (Signed)
Fax sent to pharmacy.  

## 2016-10-28 NOTE — Progress Notes (Signed)
OFFICE VISIT  10/29/2016   CC:  Chief Complaint  Patient presents with  . Follow-up    RCI, pt is fasting.    HPI:    Patient is a 61 y.o. Caucasian male who presents for 4 mo f/u DM 2, HTN, HLD, CRI stage II/III (GFR @ 60 ml/min). Additionally, last f/u visit he reported adverse side effects from [place] so I changed him to trazedone for insomnia.  DM 2: 138 2H PP "the other day".  99 on another day 2H PP.  He doesn't check fasting glucoses. Compliant with meds, but NOT trying to eat a diabetic diet.   Does karate stretches for exercises.  Also playing in a band--4 hour sets.  HLD: taking statin daily, w/out side effects.  HTN: home bp monitoring has been normal.  Insomnia: helps a lot but does increase his appetite greatly.  He is now adjusting the way he takes this so he doesn't end up eating too much in evenings.  Past Medical History:  Diagnosis Date  . Chronic renal insufficiency, stage II (mild) 2014/2015   CrCl @ 60 ml/min  . Diabetes ([place]) 07/2013  . Diabetic peripheral neuropathy associated with type 2 diabetes mellitus ([place])   . Erectile dysfunction    Dr. Tresa Moore (urol is also doing prostate ca screening).  Refractory to medications.  Pt likely to get penile implant as of urol f/u 01/2015.  Johney Maine hematuria 2017   Pt is to bring this up with his urologist as of 06/2016.  Marland Kitchen Hyperlipidemia   . Hypertension   . NSTEMI (non-ST elevated myocardial infarction) [facility]) 07/2013   CABG x 22 Jul 2013  . Obesity, Class I, BMI 30-34.9   . Paroxysmal atrial fibrillation ([place]) 07/2013   Started post-op CABG, converted on amiodarone--amiodarone stopped after a couple months    Past Surgical History:  Procedure Laterality Date  . CORONARY ARTERY BYPASS GRAFT N/A 08/02/2013   Procedure: CORONARY ARTERY BYPASS GRAFTING (CABG);  Surgeon: Grace Isaac, MD;  Location: [facility];  Service: Open Heart Surgery;  Laterality: N/A;  Coronary artery bypass graft times five utilizing the  left internal mammary artery and the right greater saphenous vein.  Marland Kitchen ENDOVEIN HARVEST OF GREATER SAPHENOUS VEIN Right 08/02/2013   Procedure: ENDOVEIN HARVEST OF GREATER SAPHENOUS VEIN;  Surgeon: Grace Isaac, MD;  Location: [place];  Service: Open Heart Surgery;  Laterality: Right;  . INTRAOPERATIVE TRANSESOPHAGEAL ECHOCARDIOGRAM N/A 08/02/2013   Procedure: INTRAOPERATIVE TRANSESOPHAGEAL ECHOCARDIOGRAM;  Surgeon: Grace Isaac, MD;  Location: [facility];  Service: Open Heart Surgery;  Laterality: N/A;.  EF 40%, LVH and LV dilatation, inf wall hypokinesis (at the time of his MI)  . LEFT HEART CATHETERIZATION WITH CORONARY ANGIOGRAM N/A 08/01/2013   Procedure: LEFT HEART CATHETERIZATION WITH CORONARY ANGIOGRAM;  Surgeon: Blane Ohara, MD;  Location: [facility] CATH LAB;  Service: Cardiovascular;  Laterality: N/A;  . NASAL FRACTURE SURGERY  1979  . SHOULDER SURGERY  1983   left; pins are in place  . TRANSTHORACIC ECHOCARDIOGRAM  07/2013   EF 40%+ wall motion abnl--in the context of acute NSTEMI    Outpatient Medications Prior to Visit  Medication Sig Dispense Refill  . albuterol (PROVENTIL HFA;VENTOLIN HFA) 108 (90 Base) MCG/ACT inhaler Inhale 2 puffs into the lungs every 4 (four) hours as needed for wheezing or shortness of breath. 1 Inhaler 0  . Alcohol Swabs PADS Please clean finger prior to checking blood sugar 100 each 12  . aspirin EC 81  MG EC tablet Take 1 tablet (81 mg total) by mouth daily.    Marland Kitchen atorvastatin (LIPITOR) 20 MG tablet Take 1 tablet (20 mg total) by mouth daily at 6 PM. 90 tablet 3  . citalopram (CELEXA) 20 MG tablet TAKE 1 TABLET (20 MG TOTAL) BY MOUTH DAILY. 30 tablet 11  . clopidogrel (PLAVIX) 75 MG tablet TAKE 1 TABLET (75 MG TOTAL) BY MOUTH DAILY WITH BREAKFAST. 90 tablet 2  . gabapentin (NEURONTIN) 300 MG capsule Take 1 capsule (300 mg total) by mouth at bedtime. 90 capsule 3  . glipiZIDE (GLUCOTROL XL) 5 MG 24 hr tablet TAKE 1 TABLET BY MOUTH EVERY DAY WITH BREAKFAST  90 tablet 1  . glucose blood test strip Use as instructed 100 each 12  . glucose monitoring kit (FREESTYLE) monitoring kit 1 each by Does not apply route as needed for other. Please choose glucometer of your choic 1 each 0  . LANCETS ULTRA THIN 30G MISC 1 Units by Does not apply route 4 (four) times daily. Breakfast, Lunch, Dinner, Bedtime 100 each 3  . LORazepam (ATIVAN) 0.5 MG tablet 1-2 tabs po q8h prn anxiety 30 tablet 5  . metFORMIN (GLUCOPHAGE) 1000 MG tablet TAKE 1 TABLET (1,000 MG TOTAL) BY MOUTH 2 (TWO) TIMES DAILY WITH A MEAL. 180 tablet 1  . metoprolol (LOPRESSOR) 50 MG tablet Take 1 tablet (50 mg total) by mouth 2 (two) times daily. 180 tablet 3  . traZODone (DESYREL) 50 MG tablet 1-2 tabs po qhs prn insomnia 60 tablet 3  . losartan (COZAAR) 50 MG tablet Take 1 tablet (50 mg total) by mouth daily. 30 tablet 11  . predniSONE (DELTASONE) 20 MG tablet 2 tabs po qd x 5d (Patient not taking: Reported on 10/29/2016) 10 tablet 0   No facility-administered medications prior to visit.     Allergies  Allergen Reactions  . Oxycodone Other (See Comments)    Psych/mental adverse effects  . Pioglitazone Swelling and Rash    ROS As per HPI  PE: Blood pressure 124/89, pulse 73, temperature 97.5 F (36.4 C), temperature source Oral, resp. rate 16, height 6' (1.829 m), weight 238 lb 4 oz (108.1 kg), SpO2 98 %. Gen: Alert, well appearing.  Patient is oriented to person, place, time, and situation. AFFECT: pleasant, lucid thought and speech. No further exam today.  LABS:  Lab Results  Component Value Date   TSH 1.33 10/17/2015   Lab Results  Component Value Date   WBC 10.6 (H) 10/17/2015   HGB 15.4 10/17/2015   HCT 46.5 10/17/2015   MCV 95.0 10/17/2015   PLT 275.0 10/17/2015   Lab Results  Component Value Date   CREATININE 1.12 06/30/2016   BUN 25 (H) 06/30/2016   NA 141 06/30/2016   K 4.7 06/30/2016   CL 103 06/30/2016   CO2 30 06/30/2016  GFR 70 ml/min on 06/30/16  Lab  Results  Component Value Date   ALT 26 03/28/2015   AST 17 03/28/2015   ALKPHOS 50 03/28/2015   BILITOT 0.6 03/28/2015   Lab Results  Component Value Date   CHOL 108 02/27/2016   Lab Results  Component Value Date   HDL 44.00 02/27/2016   Lab Results  Component Value Date   LDLCALC 41 02/27/2016   Lab Results  Component Value Date   TRIG 116.0 02/27/2016   Lab Results  Component Value Date   CHOLHDL 2 02/27/2016   Lab Results  Component Value Date   HGBA1C 6.9 (H)  06/30/2016    IMPRESSION AND PLAN:  1) DM 2.  Historically well controlled. Needs to eat diabetic diet and exercise more, though. HbA1c and urine microalb/cr today.  2) HTN; The current medical regimen is effective;  continue present plan and medications. Check lytes/cr today.  3) Hyperlipidemia: tolerating statin.  Excellent lipid panel 02/2016. Check AST/ALT today. Check FLP at next f/u for fasting CPE in 4 mo.  4) Insomnia: doing well on trazodone.  An After Visit Summary was printed and given to the patient.  FOLLOW UP: Return in about 4 months (around 02/28/2017) for annual CPE (fasting).  Signed:  Crissie Sickles, MD           10/29/2016

## 2016-10-29 ENCOUNTER — Encounter: Payer: Self-pay | Admitting: Family Medicine

## 2016-10-29 ENCOUNTER — Ambulatory Visit (INDEPENDENT_AMBULATORY_CARE_PROVIDER_SITE_OTHER): Payer: Medicare HMO | Admitting: Family Medicine

## 2016-10-29 VITALS — BP 124/89 | HR 73 | Temp 97.5°F | Resp 16 | Ht 72.0 in | Wt 238.2 lb

## 2016-10-29 DIAGNOSIS — F5101 Primary insomnia: Secondary | ICD-10-CM | POA: Diagnosis not present

## 2016-10-29 DIAGNOSIS — E7800 Pure hypercholesterolemia, unspecified: Secondary | ICD-10-CM

## 2016-10-29 DIAGNOSIS — I10 Essential (primary) hypertension: Secondary | ICD-10-CM | POA: Diagnosis not present

## 2016-10-29 DIAGNOSIS — E118 Type 2 diabetes mellitus with unspecified complications: Secondary | ICD-10-CM | POA: Diagnosis not present

## 2016-10-29 NOTE — Progress Notes (Signed)
Pre visit review using our clinic review tool, if applicable. No additional management support is needed unless otherwise documented below in the visit note. 

## 2016-10-30 ENCOUNTER — Other Ambulatory Visit: Payer: Self-pay | Admitting: *Deleted

## 2016-10-30 LAB — MICROALBUMIN / CREATININE URINE RATIO
Creatinine,U: 158.4 mg/dL
Microalb Creat Ratio: 1 mg/g (ref 0.0–30.0)
Microalb, Ur: 1.6 mg/dL (ref 0.0–1.9)

## 2016-10-30 LAB — COMPREHENSIVE METABOLIC PANEL
ALT: 22 U/L (ref 0–53)
AST: 13 U/L (ref 0–37)
Albumin: 4.2 g/dL (ref 3.5–5.2)
Alkaline Phosphatase: 53 U/L (ref 39–117)
BUN: 23 mg/dL (ref 6–23)
CO2: 27 mEq/L (ref 19–32)
Calcium: 9.5 mg/dL (ref 8.4–10.5)
Chloride: 106 mEq/L (ref 96–112)
Creatinine, Ser: 1.04 mg/dL (ref 0.40–1.50)
GFR: 76.84 mL/min (ref >60.00)
Glucose, Bld: 128 mg/dL — ABNORMAL HIGH (ref 70–99)
Potassium: 5.3 mEq/L — ABNORMAL HIGH (ref 3.5–5.1)
Sodium: 140 mEq/L (ref 135–145)
Total Bilirubin: 0.4 mg/dL (ref 0.2–1.2)
Total Protein: 6.6 g/dL (ref 6.0–8.3)

## 2016-10-30 LAB — HEMOGLOBIN A1C: Hgb A1c MFr Bld: 7.5 % — ABNORMAL HIGH (ref 4.6–6.5)

## 2016-10-30 MED ORDER — ATORVASTATIN CALCIUM 20 MG PO TABS: 20.0000 mg | ORAL_TABLET | Freq: Every day | ORAL | 3 refills | Status: DC

## 2016-10-30 NOTE — Telephone Encounter (Signed)
[facility].  RF request for atorvastatin LOV: 10/30/16 Next ov: 02/27/17 Last written: 10/15/15 #90 w/ 3RF

## 2016-10-31 ENCOUNTER — Other Ambulatory Visit: Payer: Self-pay | Admitting: *Deleted

## 2016-10-31 DIAGNOSIS — E875 Hyperkalemia: Secondary | ICD-10-CM

## 2016-11-03 ENCOUNTER — Other Ambulatory Visit: Payer: Self-pay | Admitting: Family Medicine

## 2016-11-03 ENCOUNTER — Other Ambulatory Visit: Payer: Self-pay | Admitting: *Deleted

## 2016-11-03 MED ORDER — GLIPIZIDE ER 5 MG PO TB24: 10.0000 mg | ORAL_TABLET | Freq: Every day | ORAL | 0 refills | Status: DC

## 2016-11-17 ENCOUNTER — Other Ambulatory Visit: Payer: Medicare HMO

## 2016-11-24 ENCOUNTER — Other Ambulatory Visit (INDEPENDENT_AMBULATORY_CARE_PROVIDER_SITE_OTHER): Payer: Medicare HMO

## 2016-11-24 DIAGNOSIS — E875 Hyperkalemia: Secondary | ICD-10-CM | POA: Diagnosis not present

## 2016-11-24 LAB — BASIC METABOLIC PANEL
BUN: 24 mg/dL — ABNORMAL HIGH (ref 6–23)
CO2: 25 mEq/L (ref 19–32)
Calcium: 10 mg/dL (ref 8.4–10.5)
Chloride: 102 mEq/L (ref 96–112)
Creatinine, Ser: 1.15 mg/dL (ref 0.40–1.50)
GFR: 68.41 mL/min (ref >60.00)
Glucose, Bld: 216 mg/dL — ABNORMAL HIGH (ref 70–99)
Potassium: 5 mEq/L (ref 3.5–5.1)
Sodium: 136 mEq/L (ref 135–145)

## 2016-12-01 ENCOUNTER — Other Ambulatory Visit: Payer: Self-pay | Admitting: *Deleted

## 2016-12-01 MED ORDER — TRAZODONE HCL 50 MG PO TABS: ORAL_TABLET | ORAL | 3 refills | Status: DC

## 2016-12-01 MED ORDER — CITALOPRAM HYDROBROMIDE 20 MG PO TABS: ORAL_TABLET | ORAL | 3 refills | Status: DC

## 2016-12-01 NOTE — Telephone Encounter (Signed)
[facility] requesting [facility] supply.  RF request for trazodone LOV: 10/29/16 Next ov: 02/27/17 Last written: 11/03/16 #60 w/ 11RF  RF request for citalopram -------------------this Rx has been sent.  Last written: 08/05/16 #30 w/ 11RF   Please advise. Thanks.

## 2016-12-28 ENCOUNTER — Other Ambulatory Visit: Payer: Self-pay | Admitting: Family Medicine

## 2017-01-09 ENCOUNTER — Other Ambulatory Visit: Payer: Self-pay | Admitting: *Deleted

## 2017-01-09 MED ORDER — GLIPIZIDE ER 10 MG PO TB24: 10.0000 mg | ORAL_TABLET | Freq: Every day | ORAL | 1 refills | Status: DC

## 2017-01-30 ENCOUNTER — Other Ambulatory Visit: Payer: Self-pay | Admitting: *Deleted

## 2017-01-30 MED ORDER — GABAPENTIN 300 MG PO CAPS: 300.0000 mg | ORAL_CAPSULE | Freq: Every day | ORAL | 3 refills | Status: DC

## 2017-01-30 NOTE — Telephone Encounter (Signed)
[facility].  RF request for gabapentin LOV: 10/29/16 Next ov: 02/27/17 Last written: 11/20/15 #90 w/ 3RF

## 2017-02-09 ENCOUNTER — Other Ambulatory Visit: Payer: Self-pay | Admitting: Family Medicine

## 2017-02-15 DIAGNOSIS — Z1211 Encounter for screening for malignant neoplasm of colon: Secondary | ICD-10-CM

## 2017-02-15 HISTORY — DX: Encounter for screening for malignant neoplasm of colon: Z12.11

## 2017-02-23 ENCOUNTER — Other Ambulatory Visit: Payer: Self-pay | Admitting: Cardiovascular Disease

## 2017-02-26 NOTE — Progress Notes (Signed)
Subjective:   Alex Yang is a 61 y.o. male who presents for an Initial Medicare Annual Wellness Visit.  Review of Systems  No ROS.  Medicare Wellness Visit. Additional risk factors are reflected in the social history.  Cardiac Risk Factors include: advanced age (>14mn, >>40women);diabetes mellitus;dyslipidemia;hypertension;obesity (BMI >30kg/m2);family history of premature cardiovascular disease   Sleep patterns: Sleeps 7-8 hours, feels rested. Takes Trazadone.  Home Safety/Smoke Alarms: Feels safe in home. Smoke alarms in place.  Living environment; residence and Firearm Safety: Lives with wife in 1 story home.  Seat Belt Safety/Bike Helmet: Wears seat belt.   Counseling:   Eye Exam-Last exam 02/23/2017, yearly Dr. LTruman Hayward([facility]-Last exam > 5 years.   Male:   CCS-Cologuard ordered.     PSA- No results found for: PSA     Objective:    Today's Vitals   02/27/17 0952  BP: 110/69  Pulse: 65  Resp: 16  Temp: 97.9 F (36.6 C)  TempSrc: Oral  SpO2: 96%  Weight: 238 lb (108 kg)  Height: 5' 11.5" (1.816 m)   Body mass index is 32.73 kg/m.  Current Medications (verified) Outpatient Encounter Prescriptions as of 02/27/2017  Medication Sig  . albuterol (PROVENTIL HFA;VENTOLIN HFA) 108 (90 Base) MCG/ACT inhaler Inhale 2 puffs into the lungs every 4 (four) hours as needed for wheezing or shortness of breath.  . Alcohol Swabs PADS Please clean finger prior to checking blood sugar  . aspirin EC 81 MG EC tablet Take 1 tablet (81 mg total) by mouth daily.  .Marland Kitchenatorvastatin (LIPITOR) 20 MG tablet Take 1 tablet (20 mg total) by mouth daily at 6 PM.  . citalopram (CELEXA) 20 MG tablet TAKE 1 TABLET (20 MG TOTAL) BY MOUTH DAILY.  .Marland Kitchenclopidogrel (PLAVIX) 75 MG tablet TAKE 1 TABLET (75 MG TOTAL) BY MOUTH DAILY WITH BREAKFAST.  .Marland Kitchengabapentin (NEURONTIN) 300 MG capsule Take 1 capsule (300 mg total) by mouth at bedtime.  .Marland KitchenglipiZIDE (GLUCOTROL XL) 10 MG 24 hr tablet  Take 1 tablet (10 mg total) by mouth daily with breakfast.  . glucose blood test strip Use as instructed  . LANCETS ULTRA THIN 30G MISC 1 Units by Does not apply route 4 (four) times daily. Breakfast, Lunch, Dinner, Bedtime  . LORazepam (ATIVAN) 0.5 MG tablet 1-2 tabs po q8h prn anxiety  . losartan (COZAAR) 50 MG tablet TAKE 1 TABLET (50 MG TOTAL) BY MOUTH DAILY.  . metFORMIN (GLUCOPHAGE) 1000 MG tablet TAKE 1 TABLET (1,000 MG TOTAL) BY MOUTH 2 (TWO) TIMES DAILY WITH A MEAL.  . metoprolol (LOPRESSOR) 50 MG tablet Take 1 tablet (50 mg total) by mouth 2 (two) times daily.  . traZODone (DESYREL) 50 MG tablet TAKE 1 TO 2 TABLETS AT BEDTIME AS NEEDED FOR INSOMNIA  . [DISCONTINUED] glucose monitoring kit (FREESTYLE) monitoring kit 1 each by Does not apply route as needed for other. Please choose glucometer of your choic (Patient not taking: Reported on 02/27/2017)   No facility-administered encounter medications on file as of 02/27/2017.     Allergies (verified) Oxycodone and Pioglitazone   History: Past Medical History:  Diagnosis Date  . Chronic renal insufficiency, stage II (mild) 2014/2015   CrCl @ 60 ml/min  . Diabetes ([place] 07[place]2013  . Diabetic peripheral neuropathy associated with type 2 diabetes mellitus ([place]   . Erectile dysfunction    Dr. MTresa Moore(urol is also doing prostate ca screening).  Refractory to medications.  Pt likely to get penile implant as  of urol f/u 01/2015.  Johney Maine hematuria 2017   Pt is to bring this up with his urologist as of 06/2016.  Marland Kitchen Hyperlipidemia   . Hypertension   . NSTEMI (non-ST elevated myocardial infarction) [facility]) 07/2013   CABG x 22 Jul 2013  . Obesity, Class I, BMI 30-34.9   . Paroxysmal atrial fibrillation ([place]) 07/2013   Started post-op CABG, converted on amiodarone--amiodarone stopped after a couple months   Past Surgical History:  Procedure Laterality Date  . CORONARY ARTERY BYPASS GRAFT N/A 08/02/2013   Procedure: CORONARY ARTERY BYPASS  GRAFTING (CABG);  Surgeon: Grace Isaac, MD;  Location: [place];  Service: Open Heart Surgery;  Laterality: N/A;  Coronary artery bypass graft times five utilizing the left internal mammary artery and the right greater saphenous vein.  Marland Kitchen ENDOVEIN HARVEST OF GREATER SAPHENOUS VEIN Right 08/02/2013   Procedure: ENDOVEIN HARVEST OF GREATER SAPHENOUS VEIN;  Surgeon: Grace Isaac, MD;  Location: [place];  Service: Open Heart Surgery;  Laterality: Right;  . INTRAOPERATIVE TRANSESOPHAGEAL ECHOCARDIOGRAM N/A 08/02/2013   Procedure: INTRAOPERATIVE TRANSESOPHAGEAL ECHOCARDIOGRAM;  Surgeon: Grace Isaac, MD;  Location: [place];  Service: Open Heart Surgery;  Laterality: N/A;.  EF 40%, LVH and LV dilatation, inf wall hypokinesis (at the time of his MI)  . LEFT HEART CATHETERIZATION WITH CORONARY ANGIOGRAM N/A 08/01/2013   Procedure: LEFT HEART CATHETERIZATION WITH CORONARY ANGIOGRAM;  Surgeon: Blane Ohara, MD;  [facility] LAB;  Service: Cardiovascular;  Laterality: N/A;  . NASAL FRACTURE SURGERY  1979  . SHOULDER SURGERY  1983   left; pins are in place  . TRANSTHORACIC ECHOCARDIOGRAM  07/2013   EF 40%+ wall motion abnl--in the context of acute NSTEMI   Family History  Problem Relation Age of Onset  . Heart attack Mother   . Heart disease Mother   . Diabetes Mother   . Heart disease Father   . Cancer Brother        lung and liver   Social History   Occupational History  . Process Server    Social History Main Topics  . Smoking status: Never Smoker  . Smokeless tobacco: Never Used  . Alcohol use No  . Drug use: No  . Sexual activity: Not on file   Tobacco Counseling Counseling given: No   Activities of Daily Living In your present state of health, do you have any difficulty performing the following activities: 02/27/2017  Hearing? N  Vision? N  Difficulty concentrating or making decisions? N  Walking or climbing stairs? N  Dressing or bathing? N  Doing errands,  shopping? N  Preparing Food and eating ? N  Using the Toilet? N  In the past six months, have you accidently leaked urine? N  Do you have problems with loss of bowel control? N  Managing your Medications? N  Managing your Finances? N  Housekeeping or managing your Housekeeping? N  Some recent data might be hidden    Immunizations and Health Maintenance Immunization History  Administered Date(s) Administered  . Influenza Split 05/09/2016  . Influenza,inj,Quad PF,36+ Mos 07/12/2014, 07/19/2015  . Pneumococcal Polysaccharide-23 11/17/2013  . Tdap 03/08/2014   Health Maintenance Due  Topic Date Due  . FOOT EXAM  02/26/2017    Patient Care Team: Tammi Sou, MD as PCP - General (Family Medicine) Alexis Frock, MD as Consulting Physician (Urology) Nahser, Wonda Cheng, MD as Consulting Physician (Cardiology)  Indicate any recent Medical Services you may have received from  other than [facility] providers in the past year (date may be approximate).    Assessment:   This is a routine wellness examination for Ndrew. Physical assessment deferred to PCP.   Hearing/Vision screen Hearing Screening Comments: Able to hear conversational tones w/o difficulty. No issues reported.   Vision Screening Comments: Wears contacts.   Dietary issues and exercise activities discussed: Exercise limited by: None identified   Diet (meal preparation, eat out, water intake, caffeinated beverages, dairy products, fruits and vegetables): Drinks water  Breakfast: Atkins shake Lunch: [place] burgers, [place] bread Dinner: Protein, vegetables  Discussed heart healthy diet and being active.   Goals      Patient Stated   . <enter goal here> (pt-stated)          Stay healthy      Depression Screen PHQ 2/9 Scores 02/27/2017 02/27/2017 07/30/2015  PHQ - 2 Score 0 0 0  PHQ- 9 Score - 1 -  Exception Documentation - - Other- indicate reason in comment box    Fall Risk Fall Risk  02/27/2017 07/30/2015   Falls in the past year? No No  Risk for fall due to : - Other (Comment)    Cognitive Function:       Ad8 score reviewed for issues:  Issues making decisions: no  Less interest in hobbies / activities: no  Repeats questions, stories (family complaining): no  Trouble using ordinary gadgets (microwave, computer, phone): no  Forgets the month or year: no  Mismanaging finances: no  Remembering appts: no  Daily problems with thinking and/or memory: no Ad8 score is=0     Screening Tests Health Maintenance  Topic Date Due  . FOOT EXAM  02/26/2017  . COLONOSCOPY  02/27/2018 (Originally 07/13/2004)  . INFLUENZA VACCINE  03/18/2017  . HEMOGLOBIN A1C  05/01/2017  . OPHTHALMOLOGY EXAM  02/23/2018  . PNEUMOCOCCAL POLYSACCHARIDE VACCINE (2) 11/18/2018  . TETANUS/TDAP  03/08/2024  . Hepatitis C Screening  Completed  . HIV Screening  Completed        Plan:    Bring a copy of your advance directives to your next office visit.  Complete Cologuard test.   I have personally reviewed and noted the following in the patient's chart:   . Medical and social history . Use of alcohol, tobacco or illicit drugs  . Current medications and supplements . Functional ability and status . Nutritional status . Physical activity . Advanced directives . List of other physicians . Hospitalizations, surgeries, and ER visits in previous 12 months . Vitals . Screenings to include cognitive, depression, and falls . Referrals and appointments  In addition, I have reviewed and discussed with patient certain preventive protocols, quality metrics, and best practice recommendations. A written personalized care plan for preventive services as well as general preventive health recommendations were provided to patient.     Gerilyn Nestle, RN   02/27/2017

## 2017-02-27 ENCOUNTER — Encounter: Payer: Self-pay | Admitting: Family Medicine

## 2017-02-27 ENCOUNTER — Ambulatory Visit (INDEPENDENT_AMBULATORY_CARE_PROVIDER_SITE_OTHER): Payer: Medicare HMO | Admitting: Family Medicine

## 2017-02-27 VITALS — BP 110/69 | HR 65 | Temp 97.9°F | Resp 16 | Ht 71.5 in | Wt 238.0 lb

## 2017-02-27 DIAGNOSIS — Z0000 Encounter for general adult medical examination without abnormal findings: Secondary | ICD-10-CM

## 2017-02-27 DIAGNOSIS — Z125 Encounter for screening for malignant neoplasm of prostate: Secondary | ICD-10-CM | POA: Diagnosis not present

## 2017-02-27 DIAGNOSIS — I10 Essential (primary) hypertension: Secondary | ICD-10-CM | POA: Diagnosis not present

## 2017-02-27 DIAGNOSIS — E1149 Type 2 diabetes mellitus with other diabetic neurological complication: Secondary | ICD-10-CM

## 2017-02-27 DIAGNOSIS — E7800 Pure hypercholesterolemia, unspecified: Secondary | ICD-10-CM | POA: Diagnosis not present

## 2017-02-27 LAB — LDL CHOLESTEROL, DIRECT: Direct LDL: 51 mg/dL

## 2017-02-27 LAB — CBC WITH DIFFERENTIAL/PLATELET
Basophils Absolute: 0.1 10*3/uL (ref 0.0–0.1)
Basophils Relative: 1 % (ref 0.0–3.0)
Eosinophils Absolute: 0.4 10*3/uL (ref 0.0–0.7)
Eosinophils Relative: 4.2 % (ref 0.0–5.0)
HCT: 46.8 % (ref 39.0–52.0)
Hemoglobin: 15.6 g/dL (ref 13.0–17.0)
Lymphocytes Relative: 33.7 % (ref 12.0–46.0)
Lymphs Abs: 3.6 10*3/uL (ref 0.7–4.0)
MCHC: 33.4 g/dL (ref 30.0–36.0)
MCV: 93.7 fl (ref 78.0–100.0)
Monocytes Absolute: 0.9 10*3/uL (ref 0.1–1.0)
Monocytes Relative: 8.6 % (ref 3.0–12.0)
Neutro Abs: 5.5 10*3/uL (ref 1.4–7.7)
Neutrophils Relative %: 52.5 % (ref 43.0–77.0)
Platelets: 260 10*3/uL (ref 150.0–400.0)
RBC: 4.99 Mil/uL (ref 4.22–5.81)
RDW: 13.5 % (ref 11.5–15.5)
WBC: 10.6 10*3/uL — ABNORMAL HIGH (ref 4.0–10.5)

## 2017-02-27 LAB — BASIC METABOLIC PANEL
BUN: 16 mg/dL (ref 6–23)
CO2: 26 mEq/L (ref 19–32)
Calcium: 9.7 mg/dL (ref 8.4–10.5)
Chloride: 103 mEq/L (ref 96–112)
Creatinine, Ser: 1.09 mg/dL (ref 0.40–1.50)
GFR: 72.71 mL/min (ref >60.00)
Glucose, Bld: 88 mg/dL (ref 70–99)
Potassium: 4.5 mEq/L (ref 3.5–5.1)
Sodium: 138 mEq/L (ref 135–145)

## 2017-02-27 LAB — LIPID PANEL
Cholesterol: 121 mg/dL (ref 0–200)
HDL: 44.9 mg/dL (ref >39.00)
NonHDL: 76.23
Total CHOL/HDL Ratio: 3
Triglycerides: 202 mg/dL — ABNORMAL HIGH (ref 0.0–149.0)
VLDL: 40.4 mg/dL — ABNORMAL HIGH (ref 0.0–40.0)

## 2017-02-27 LAB — HEMOGLOBIN A1C: Hgb A1c MFr Bld: 7.3 % — ABNORMAL HIGH (ref 4.6–6.5)

## 2017-02-27 LAB — PSA, MEDICARE: PSA: 1.71 ng/ml (ref 0.10–4.00)

## 2017-02-27 LAB — TSH: TSH: 2.42 u[IU]/mL (ref 0.35–4.50)

## 2017-02-27 NOTE — Progress Notes (Signed)
Office Note 02/27/2017  CC:  Chief Complaint  Patient presents with  . Annual Exam    Pt is fasting.     HPI:  Alex Yang is a 61 y.o. White male who is here for annual health maintenance exam.  Last f/u visit we increased his glipizide xl to 74m qd due to slightly elevated HbA1c. Got eye exam a few days ago: all good.  Says his brother died a few days ago from metastatic lung cancer.  Glucoses: 90-120s.  No hypoglycemia. No burning, tingling, or numbness in feet.   Past Medical History:  Diagnosis Date  . Chronic renal insufficiency, stage II (mild) 2014/2015   CrCl @ 60 ml/min  . [place] ([place] 07/2013  . Diabetic peripheral neuropathy associated with type 2 diabetes mellitus ([place]   . Erectile dysfunction    Dr. MTresa Moore(urol is also doing prostate ca screening).  Refractory to medications.  Pt likely to get penile implant as of urol f/u 01/2015.  .Johney Mainehematuria 2017   Pt is to bring this up with his urologist as of 06/2016.  .Marland KitchenHyperlipidemia   . Hypertension   . NSTEMI (non-ST elevated myocardial infarction) ([facility] 07/2013   CABG x 22 Jul 2013  . Obesity, Class I, BMI 30-34.9   . Paroxysmal atrial fibrillation ([place] 07/2013   Started post-op CABG, converted on amiodarone--amiodarone stopped after a couple months    Past Surgical History:  Procedure Laterality Date  . CORONARY ARTERY BYPASS GRAFT N/A 08/02/2013   Procedure: CORONARY ARTERY BYPASS GRAFTING (CABG);  Surgeon: EGrace Isaac MD;  [place]  Service: Open Heart Surgery;  Laterality: N/A;  Coronary artery bypass graft times five utilizing the left internal mammary artery and the right greater saphenous vein.  .Marland KitchenENDOVEIN HARVEST OF GREATER SAPHENOUS VEIN Right 08/02/2013   Procedure: ENDOVEIN HARVEST OF GREATER SAPHENOUS VEIN;  Surgeon: EGrace Isaac MD;  [place]  Service: Open Heart Surgery;  Laterality: Right;  . INTRAOPERATIVE TRANSESOPHAGEAL ECHOCARDIOGRAM N/A 08/02/2013    Procedure: INTRAOPERATIVE TRANSESOPHAGEAL ECHOCARDIOGRAM;  Surgeon: EGrace Isaac MD;  [place]  Service: Open Heart Surgery;  Laterality: N/A;.  EF 40%, LVH and LV dilatation, inf wall hypokinesis (at the time of his MI)  . LEFT HEART CATHETERIZATION WITH CORONARY ANGIOGRAM N/A 08/01/2013   Procedure: LEFT HEART CATHETERIZATION WITH CORONARY ANGIOGRAM;  Surgeon: MBlane Ohara MD;  [facility];  Service: Cardiovascular;  Laterality: N/A;  . NASAL FRACTURE SURGERY  1979  . SHOULDER SURGERY  1983   left; pins are in place  . TRANSTHORACIC ECHOCARDIOGRAM  07/2013   EF 40%+ wall motion abnl--in the context of acute NSTEMI    Family History  Problem Relation Age of Onset  . Heart attack Mother   . Heart disease Mother   . Diabetes Mother   . Heart disease Father     Social History   Social History  . Marital status: Married    Spouse name: N/A  . Number of children: N/A  . Years of education: N/A   Occupational History  . Process Server    Social History Main Topics  . Smoking status: Never Smoker  . Smokeless tobacco: Never Used  . Alcohol use No  . Drug use: No  . Sexual activity: Not on file   Other Topics Concern  . Not on file   Social History Narrative   Single, lives with fiance'.   Has 3 biologic children.  Occupation:  Theme park manager (self employed).   Education: HS   No tobacco, alc, or drugs   Starts cardiac rehab 10/06/13.    Outpatient Medications Prior to Visit  Medication Sig Dispense Refill  . albuterol (PROVENTIL HFA;VENTOLIN HFA) 108 (90 Base) MCG/ACT inhaler Inhale 2 puffs into the lungs every 4 (four) hours as needed for wheezing or shortness of breath. 1 Inhaler 0  . Alcohol Swabs PADS Please clean finger prior to checking blood sugar 100 each 12  . aspirin EC 81 MG EC tablet Take 1 tablet (81 mg total) by mouth daily.    Marland Kitchen atorvastatin (LIPITOR) 20 MG tablet Take 1 tablet (20 mg total) by mouth daily at 6 PM. 90 tablet 3   . citalopram (CELEXA) 20 MG tablet TAKE 1 TABLET (20 MG TOTAL) BY MOUTH DAILY. 90 tablet 3  . clopidogrel (PLAVIX) 75 MG tablet TAKE 1 TABLET (75 MG TOTAL) BY MOUTH DAILY WITH BREAKFAST. 90 tablet 2  . gabapentin (NEURONTIN) 300 MG capsule Take 1 capsule (300 mg total) by mouth at bedtime. 90 capsule 3  . glipiZIDE (GLUCOTROL XL) 10 MG 24 hr tablet Take 1 tablet (10 mg total) by mouth daily with breakfast. 90 tablet 1  . glucose blood test strip Use as instructed 100 each 12  . LANCETS ULTRA THIN 30G MISC 1 Units by Does not apply route 4 (four) times daily. Breakfast, Lunch, Dinner, Bedtime 100 each 3  . LORazepam (ATIVAN) 0.5 MG tablet 1-2 tabs po q8h prn anxiety 30 tablet 5  . losartan (COZAAR) 50 MG tablet TAKE 1 TABLET (50 MG TOTAL) BY MOUTH DAILY. 30 tablet 1  . metFORMIN (GLUCOPHAGE) 1000 MG tablet TAKE 1 TABLET (1,000 MG TOTAL) BY MOUTH 2 (TWO) TIMES DAILY WITH A MEAL. 180 tablet 1  . metoprolol (LOPRESSOR) 50 MG tablet Take 1 tablet (50 mg total) by mouth 2 (two) times daily. 180 tablet 3  . traZODone (DESYREL) 50 MG tablet TAKE 1 TO 2 TABLETS AT BEDTIME AS NEEDED FOR INSOMNIA 180 tablet 3  . glucose monitoring kit (FREESTYLE) monitoring kit 1 each by Does not apply route as needed for other. Please choose glucometer of your choic (Patient not taking: Reported on 02/27/2017) 1 each 0   No facility-administered medications prior to visit.     Allergies  Allergen Reactions  . Oxycodone Other (See Comments)    Psych/mental adverse effects  . Pioglitazone Swelling and Rash    ROS Review of Systems  Constitutional: Negative for appetite change, chills, fatigue and fever.  HENT: Negative for congestion, dental problem, ear pain and sore throat.   Eyes: Negative for discharge, redness and visual disturbance.  Respiratory: Negative for cough, chest tightness, shortness of breath and wheezing.   Cardiovascular: Negative for chest pain, palpitations and leg swelling.  Gastrointestinal:  Negative for abdominal pain, blood in stool, diarrhea, nausea and vomiting.  Genitourinary: Negative for difficulty urinating, dysuria, flank pain, frequency, hematuria and urgency.  Musculoskeletal: Negative for arthralgias, back pain, joint swelling, myalgias and neck stiffness.  Skin: Negative for pallor and rash.  Neurological: Negative for dizziness, speech difficulty, weakness and headaches.  Hematological: Negative for adenopathy. Does not bruise/bleed easily.  Psychiatric/Behavioral: Negative for confusion and sleep disturbance. The patient is not nervous/anxious.     PE; Blood pressure 110/69, pulse 65, temperature 97.9 F (36.6 C), temperature source Oral, resp. rate 16, height 5' 11.5" (1.816 m), weight 238 lb (108 kg), SpO2 96 %. Gen: Alert, well appearing.  Patient is oriented  to person, place, time, and situation. AFFECT: pleasant, lucid thought and speech. ENT: Ears: EACs clear, normal epithelium.  TMs with good light reflex and landmarks bilaterally.  Eyes: no injection, icteris, swelling, or exudate.  EOMI, PERRLA. Nose: no drainage or turbinate edema/swelling.  No injection or focal lesion.  Mouth: lips without lesion/swelling.  Oral mucosa pink and moist.  Dentition intact and without obvious caries or gingival swelling.  Oropharynx without erythema, exudate, or swelling.  Neck: supple/nontender.  No LAD, mass, or TM.  Carotid pulses 2+ bilaterally, without bruits. CV: RRR, no m/r/g.   LUNGS: CTA bilat, nonlabored resps, good aeration in all lung fields. ABD: soft, NT, ND, BS normal.  No hepatospenomegaly or mass.  No bruits. EXT: no clubbing, cyanosis, or edema.  Musculoskeletal: no joint swelling, erythema, warmth, or tenderness.  ROM of all joints intact. Skin - no sores or suspicious lesions or rashes or color changes Rectal exam: negative without mass, lesions or tenderness, PROSTATE EXAM: smooth and symmetric without nodules or tenderness. Foot exam - both sides  normal; no swelling, tenderness or skin or vascular lesions. Color and temperature is normal. Sensation is intact. Peripheral pulses are palpable. Toenails are normal.  Pertinent labs:  Lab Results  Component Value Date   TSH 1.33 10/17/2015   Lab Results  Component Value Date   WBC 10.6 (H) 10/17/2015   HGB 15.4 10/17/2015   HCT 46.5 10/17/2015   MCV 95.0 10/17/2015   PLT 275.0 10/17/2015   Lab Results  Component Value Date   CREATININE 1.15 11/24/2016   BUN 24 (H) 11/24/2016   NA 136 11/24/2016   K 5.0 11/24/2016   CL 102 11/24/2016   CO2 25 11/24/2016   Lab Results  Component Value Date   ALT 22 10/29/2016   AST 13 10/29/2016   ALKPHOS 53 10/29/2016   BILITOT 0.4 10/29/2016   Lab Results  Component Value Date   CHOL 108 02/27/2016   Lab Results  Component Value Date   HDL 44.00 02/27/2016   Lab Results  Component Value Date   LDLCALC 41 02/27/2016   Lab Results  Component Value Date   TRIG 116.0 02/27/2016   Lab Results  Component Value Date   CHOLHDL 2 02/27/2016   Lab Results  Component Value Date   HGBA1C 7.5 (H) 10/29/2016   ASSESSMENT AND PLAN:   Health maintenance exam: Reviewed age and gender appropriate health maintenance issues (prudent diet, regular exercise, health risks of tobacco and excessive alcohol, use of seatbelts, fire alarms in home, use of sunscreen).  Also reviewed age and gender appropriate health screening as well as vaccine recommendations. Vaccines: UTD.  Discussed shingrix today--gave rx for this today. LABS: HP labs +HbA1c. Prostate ca screening: DRE normal, PSA today. Colon ca screening: he declined colonoscopy, chose to do cologuard and he is aware that if this returns positive then he needs colonoscopy and he is fine with this.  An After Visit Summary was printed and given to the patient.  FOLLOW UP:  3 mo for [facility] f/u (DM 2).  Signed:  Crissie Sickles, MD           02/27/2017

## 2017-02-27 NOTE — Patient Instructions (Addendum)
Bring a copy of your advance directives to your next office visit.  Complete Cologuard test.    Health Maintenance, Male A healthy lifestyle and preventive care is important for your health and wellness. Ask your health care provider about what schedule of regular examinations is right for you. What should I know about weight and diet? Eat a Healthy Diet  Eat plenty of vegetables, fruits, whole grains, low-fat dairy products, and lean protein.  Do not eat a lot of foods high in solid fats, added sugars, or salt.  Maintain a Healthy Weight Regular exercise can help you achieve or maintain a healthy weight. You should:  Do at least 150 minutes of exercise each week. The exercise should increase your heart rate and make you sweat (moderate-intensity exercise).  Do strength-training exercises at least twice a week.  Watch Your Levels of Cholesterol and Blood Lipids  Have your blood tested for lipids and cholesterol every 5 years starting at 63 years of age. If you are at high risk for heart disease, you should start having your blood tested when you are 63 years old. You may need to have your cholesterol levels checked more often if: ? Your lipid or cholesterol levels are high. ? You are older than 63 years of age. ? You are at high risk for heart disease.  What should I know about cancer screening? Many types of cancers can be detected early and may often be prevented. Lung Cancer  You should be screened every year for lung cancer if: ? You are a current smoker who has smoked for at least 30 years. ? You are a former smoker who has quit within the past 15 years.  Talk to your health care provider about your screening options, when you should start screening, and how often you should be screened.  Colorectal Cancer  Routine colorectal cancer screening usually begins at 62 years of age and should be repeated every 5-10 years until you are 63 years old. You may need to be screened  more often if early forms of precancerous polyps or small growths are found. Your health care provider may recommend screening at an earlier age if you have risk factors for colon cancer.  Your health care provider may recommend using home test kits to check for hidden blood in the stool.  A small camera at the end of a tube can be used to examine your colon (sigmoidoscopy or colonoscopy). This checks for the earliest forms of colorectal cancer.  Prostate and Testicular Cancer  Depending on your age and overall health, your health care provider may do certain tests to screen for prostate and testicular cancer.  Talk to your health care provider about any symptoms or concerns you have about testicular or prostate cancer.  Skin Cancer  Check your skin from head to toe regularly.  Tell your health care provider about any new moles or changes in moles, especially if: ? There is a change in a mole's size, shape, or color. ? You have a mole that is larger than a pencil eraser.  Always use sunscreen. Apply sunscreen liberally and repeat throughout the day.  Protect yourself by wearing long sleeves, pants, a wide-brimmed hat, and sunglasses when outside.  What should I know about heart disease, diabetes, and high blood pressure?  If you are 13-64 years of age, have your blood pressure checked every 3-5 years. If you are 16 years of age or older, have your blood pressure checked every year.  You should have your blood pressure measured twice-once when you are at a hospital or clinic, and once when you are not at a hospital or clinic. Record the average of the two measurements. To check your blood pressure when you are not at a hospital or clinic, you can use: ? An automated blood pressure machine at a pharmacy. ? A home blood pressure monitor.  Talk to your health care provider about your target blood pressure.  If you are between 79-24 years old, ask your health care provider if you should  take aspirin to prevent heart disease.  Have regular diabetes screenings by checking your fasting blood sugar level. ? If you are at a normal weight and have a low risk for diabetes, have this test once every three years after the age of 64. ? If you are overweight and have a high risk for diabetes, consider being tested at a younger age or more often.  A one-time screening for abdominal aortic aneurysm (AAA) by ultrasound is recommended for men aged 69-75 years who are current or former smokers. What should I know about preventing infection? Hepatitis B If you have a higher risk for hepatitis B, you should be screened for this virus. Talk with your health care provider to find out if you are at risk for hepatitis B infection. Hepatitis C Blood testing is recommended for:  Everyone born from [birth year] through 1967.  Anyone with known risk factors for hepatitis C.  Sexually Transmitted Diseases (STDs)  You should be screened each year for STDs including gonorrhea and chlamydia if: ? You are sexually active and are younger than 63 years of age. ? You are older than 63 years of age and your health care provider tells you that you are at risk for this type of infection. ? Your sexual activity has changed since you were last screened and you are at an increased risk for chlamydia or gonorrhea. Ask your health care provider if you are at risk.  Talk with your health care provider about whether you are at high risk of being infected with HIV. Your health care provider Eoff recommend a prescription medicine to help prevent HIV infection.  What else can I do?  Schedule regular health, dental, and eye exams.  Stay current with your vaccines (immunizations).  Do not use any tobacco products, such as cigarettes, chewing tobacco, and e-cigarettes. If you need help quitting, ask your health care provider.  Limit alcohol intake to no more than 2 drinks per day. One drink equals 12 ounces of beer, 5  ounces of wine, or 1 ounces of hard liquor.  Do not use street drugs.  Do not share needles.  Ask your health care provider for help if you need support or information about quitting drugs.  Tell your health care provider if you often feel depressed.  Tell your health care provider if you have ever been abused or do not feel safe at home. This information is not intended to replace advice given to you by your health care provider. Make sure you discuss any questions you have with your health care provider. Document Released: 01/31/2008 Document Revised: 04/02/2016 Document Reviewed: 05/08/2015 Elsevier Interactive Patient Education  [employer].

## 2017-03-02 ENCOUNTER — Telehealth: Payer: Self-pay

## 2017-03-02 MED ORDER — SITAGLIPTIN PHOSPHATE 100 MG PO TABS: 100.0000 mg | ORAL_TABLET | Freq: Every day | ORAL | 6 refills | Status: DC

## 2017-03-02 NOTE — Telephone Encounter (Signed)
Noted  

## 2017-03-02 NOTE — Telephone Encounter (Signed)
-----   Message from Tammi Sou, MD sent at 02/27/2017  5:57 PM EDT ----- All labs normal except hba1c only came down from 7.5% to 7.3%. I recommend he continue on current meds for diabetes AND I recommend we add another diabetic med called [place]. If pt agreeable to this, pls eRx januvia 100 mg, 1 tab po qd, #30, RF x 6-thx

## 2017-03-02 NOTE — Telephone Encounter (Signed)
Patient's wife notified and verbalized understanding. Okay per DPR. Medication sent to pharmacy listed.

## 2017-03-09 NOTE — Progress Notes (Signed)
AWV reviewed and agree.  Signed:  Phil Kivon Aprea, MD           03/09/2017  

## 2017-03-11 DIAGNOSIS — Z1212 Encounter for screening for malignant neoplasm of rectum: Secondary | ICD-10-CM | POA: Diagnosis not present

## 2017-03-11 DIAGNOSIS — Z1211 Encounter for screening for malignant neoplasm of colon: Secondary | ICD-10-CM | POA: Diagnosis not present

## 2017-03-11 LAB — COLOGUARD: Cologuard: POSITIVE

## 2017-03-18 ENCOUNTER — Telehealth: Payer: Self-pay | Admitting: Family Medicine

## 2017-03-18 ENCOUNTER — Encounter: Payer: Self-pay | Admitting: Family Medicine

## 2017-03-18 DIAGNOSIS — R195 Other fecal abnormalities: Secondary | ICD-10-CM

## 2017-03-18 NOTE — Telephone Encounter (Signed)
OK, referral ordered 

## 2017-03-18 NOTE — Telephone Encounter (Signed)
Pls notify pt that his cologuard stool test to screen for colon cancer came back POSITIVE. Remind him that this does not necessarily mean he has colon cancer.  It means he needs the next step to look into this possibility, which is a colonoscopy. Make sure [facility] is ok with pt and let me know, then I'll order GI referral.--thx

## 2017-03-18 NOTE — Telephone Encounter (Signed)
Pt advised and voiced understanding. Pt okay with [facility].

## 2017-04-02 ENCOUNTER — Telehealth: Payer: Self-pay | Admitting: *Deleted

## 2017-04-02 NOTE — Telephone Encounter (Signed)
Pt called to report that he feels like his Celesta Gentile is working well. He has been having BS readings in the 90's and today it was 73. He has no symptoms of low BS. He wanted to know if this was okay? Please advise. Thanks.

## 2017-04-02 NOTE — Telephone Encounter (Signed)
This is fine as long as he feels good.

## 2017-04-02 NOTE — Telephone Encounter (Signed)
Pt advised and voiced understanding.   

## 2017-04-07 ENCOUNTER — Telehealth: Payer: Self-pay | Admitting: *Deleted

## 2017-04-07 MED ORDER — LORAZEPAM 0.5 MG PO TABS: ORAL_TABLET | ORAL | 5 refills | Status: DC

## 2017-04-07 NOTE — Telephone Encounter (Signed)
I printed new lorazepam rx that he'll have to take to a different pharmacy.-thx

## 2017-04-07 NOTE — Telephone Encounter (Signed)
Received fax from [facility] in [place] stating that Lorazepam is on back order. Please advise. Thanks.

## 2017-04-08 NOTE — Telephone Encounter (Signed)
Pt advised and voiced understanding.   

## 2017-04-13 NOTE — Telephone Encounter (Signed)
Received refill request from [facility] for lorazepam. I looked up front and pt had not picked up Rx for Lorazepam, Rx faxed to [facility].

## 2017-04-19 ENCOUNTER — Other Ambulatory Visit: Payer: Self-pay | Admitting: Cardiovascular Disease

## 2017-04-27 ENCOUNTER — Ambulatory Visit: Payer: Medicare HMO | Admitting: Internal Medicine

## 2017-05-08 ENCOUNTER — Other Ambulatory Visit: Payer: Self-pay | Admitting: Cardiovascular Disease

## 2017-05-19 ENCOUNTER — Other Ambulatory Visit: Payer: Self-pay | Admitting: Cardiovascular Disease

## 2017-05-20 NOTE — Telephone Encounter (Signed)
Please keep upcoming appointment on 06/25/17 for further refills.

## 2017-06-01 ENCOUNTER — Encounter: Payer: Self-pay | Admitting: Family Medicine

## 2017-06-01 ENCOUNTER — Ambulatory Visit (INDEPENDENT_AMBULATORY_CARE_PROVIDER_SITE_OTHER): Payer: Medicare HMO | Admitting: Family Medicine

## 2017-06-01 VITALS — BP 96/60 | HR 74 | Temp 97.6°F | Resp 16 | Ht 71.5 in | Wt 229.2 lb

## 2017-06-01 DIAGNOSIS — N182 Chronic kidney disease, stage 2 (mild): Secondary | ICD-10-CM | POA: Diagnosis not present

## 2017-06-01 DIAGNOSIS — I10 Essential (primary) hypertension: Secondary | ICD-10-CM

## 2017-06-01 DIAGNOSIS — E7800 Pure hypercholesterolemia, unspecified: Secondary | ICD-10-CM | POA: Diagnosis not present

## 2017-06-01 DIAGNOSIS — E118 Type 2 diabetes mellitus with unspecified complications: Secondary | ICD-10-CM | POA: Diagnosis not present

## 2017-06-01 DIAGNOSIS — I959 Hypotension, unspecified: Secondary | ICD-10-CM

## 2017-06-01 DIAGNOSIS — N2889 Other specified disorders of kidney and ureter: Secondary | ICD-10-CM

## 2017-06-01 LAB — BASIC METABOLIC PANEL
BUN: 16 mg/dL (ref 6–23)
CO2: 30 mEq/L (ref 19–32)
Calcium: 10.6 mg/dL — ABNORMAL HIGH (ref 8.4–10.5)
Chloride: 100 mEq/L (ref 96–112)
Creatinine, Ser: 1.24 mg/dL (ref 0.40–1.50)
GFR: 62.6 mL/min (ref >60.00)
Glucose, Bld: 144 mg/dL — ABNORMAL HIGH (ref 70–99)
Potassium: 4.6 mEq/L (ref 3.5–5.1)
Sodium: 139 mEq/L (ref 135–145)

## 2017-06-01 LAB — HEMOGLOBIN A1C: Hgb A1c MFr Bld: 6.6 % — ABNORMAL HIGH (ref 4.6–6.5)

## 2017-06-01 MED ORDER — ALBUTEROL SULFATE HFA 108 (90 BASE) MCG/ACT IN AERS: 2.0000 | INHALATION_SPRAY | RESPIRATORY_TRACT | 1 refills | Status: DC | PRN

## 2017-06-01 NOTE — Progress Notes (Signed)
OFFICE VISIT  06/01/2017   CC:  Chief Complaint  Patient presents with  . Follow-up    RCI, pt is not fasting.     HPI:    Patient is a 61 y.o.  male who presents for 3 mo f/u DM 2, CRI with GFR about 60 ml/min, HTN, HLD.  Riding motorcycle riding is vigorous exercise for him. DM: home glucoses 90s-110.    HTN: home monitoring: 120s/80s consistently.   Feels generalized fatigue but says he did not sleep hardly any last night b/c he did not take his trazodone last night. No orthostatic dizziness.  Additionally, his cologuard was positive this summer and I referred him to Dr. Hilarie Fredrickson in [facility] for colonoscopy. This is scheduled for Nov 19th.  HLD: taking atorva 20mg  qd w/out side effect.  CRI: tries to pay close attention to being well hydrated.  Still taking plavix and ASA but no other NSAIDs.  Past Medical History:  Diagnosis Date  . Chronic renal insufficiency, stage II (mild) 2014/2015   CrCl @ 60 ml/min  . Colon cancer screening 02/2017   Pt declined colonoscopy, but agreed to cologuard screening--this test was POSITIVE on 03/11/17---referred to GI 03/18/17.  . Diabetes ([place]) 07/2013  . Diabetic peripheral neuropathy associated with type 2 diabetes mellitus ([facility])   . Erectile dysfunction    Dr. Tresa Moore (urol is also doing prostate ca screening).  Refractory to medications.  Pt likely to get penile implant as of urol f/u 01/2015.  Johney Maine hematuria 2017   Pt is to bring this up with his urologist as of 06/2016.  Marland Kitchen Hyperlipidemia   . Hypertension   . NSTEMI (non-ST elevated myocardial infarction) [facility]) 07/2013   CABG x 22 Jul 2013  . Obesity, Class I, BMI 30-34.9   . Paroxysmal atrial fibrillation ([place]) 07/2013   Started post-op CABG, converted on amiodarone--amiodarone stopped after a couple months    Past Surgical History:  Procedure Laterality Date  . CORONARY ARTERY BYPASS GRAFT N/A 08/02/2013   Procedure: CORONARY ARTERY BYPASS GRAFTING (CABG);  Surgeon: Grace Isaac, MD;  Location: [facility];  Service: Open Heart Surgery;  Laterality: N/A;  Coronary artery bypass graft times five utilizing the left internal mammary artery and the right greater saphenous vein.  Marland Kitchen ENDOVEIN HARVEST OF GREATER SAPHENOUS VEIN Right 08/02/2013   Procedure: ENDOVEIN HARVEST OF GREATER SAPHENOUS VEIN;  Surgeon: Grace Isaac, MD;  Location: [facility];  Service: Open Heart Surgery;  Laterality: Right;  . INTRAOPERATIVE TRANSESOPHAGEAL ECHOCARDIOGRAM N/A 08/02/2013   Procedure: INTRAOPERATIVE TRANSESOPHAGEAL ECHOCARDIOGRAM;  Surgeon: Grace Isaac, MD;  Location: [facility];  Service: Open Heart Surgery;  Laterality: N/A;.  EF 40%, LVH and LV dilatation, inf wall hypokinesis (at the time of his MI)  . LEFT HEART CATHETERIZATION WITH CORONARY ANGIOGRAM N/A 08/01/2013   Procedure: LEFT HEART CATHETERIZATION WITH CORONARY ANGIOGRAM;  Surgeon: Blane Ohara, MD;  Location: [facility] CATH LAB;  Service: Cardiovascular;  Laterality: N/A;  . NASAL FRACTURE SURGERY  1979  . SHOULDER SURGERY  1983   left; pins are in place  . TRANSTHORACIC ECHOCARDIOGRAM  07/2013   EF 40%+ wall motion abnl--in the context of acute NSTEMI    Outpatient Medications Prior to Visit  Medication Sig Dispense Refill  . Alcohol Swabs PADS Please clean finger prior to checking blood sugar 100 each 12  . aspirin EC 81 MG EC tablet Take 1 tablet (81 mg total) by mouth daily.    Marland Kitchen atorvastatin (LIPITOR)  20 MG tablet Take 1 tablet (20 mg total) by mouth daily at 6 PM. 90 tablet 3  . citalopram (CELEXA) 20 MG tablet TAKE 1 TABLET (20 MG TOTAL) BY MOUTH DAILY. 90 tablet 3  . clopidogrel (PLAVIX) 75 MG tablet TAKE 1 TABLET (75 MG TOTAL) BY MOUTH DAILY WITH BREAKFAST. 90 tablet 0  . gabapentin (NEURONTIN) 300 MG capsule Take 1 capsule (300 mg total) by mouth at bedtime. 90 capsule 3  . glipiZIDE (GLUCOTROL XL) 10 MG 24 hr tablet Take 1 tablet (10 mg total) by mouth daily with breakfast. 90 tablet 1  . glucose blood test  strip Use as instructed 100 each 12  . LANCETS ULTRA THIN 30G MISC 1 Units by Does not apply route 4 (four) times daily. Breakfast, Lunch, Dinner, Bedtime 100 each 3  . LORazepam (ATIVAN) 0.5 MG tablet 1-2 tabs po q8h prn anxiety 30 tablet 5  . losartan (COZAAR) 50 MG tablet TAKE 1 TABLET BY MOUTH DAILY. PLEASE CALL AND SCHEDULE ONE YEAR FOLLOW UP APPT FOR FURTHER REFILLS 30 tablet 1  . metFORMIN (GLUCOPHAGE) 1000 MG tablet TAKE 1 TABLET (1,000 MG TOTAL) BY MOUTH 2 (TWO) TIMES DAILY WITH A MEAL. 180 tablet 1  . metoprolol (LOPRESSOR) 50 MG tablet Take 1 tablet (50 mg total) by mouth 2 (two) times daily. 180 tablet 3  . sitaGLIPtin (JANUVIA) 100 MG tablet Take 1 tablet (100 mg total) by mouth daily. 30 tablet 6  . traZODone (DESYREL) 50 MG tablet TAKE 1 TO 2 TABLETS AT BEDTIME AS NEEDED FOR INSOMNIA 180 tablet 3  . albuterol (PROVENTIL HFA;VENTOLIN HFA) 108 (90 Base) MCG/ACT inhaler Inhale 2 puffs into the lungs every 4 (four) hours as needed for wheezing or shortness of breath. 1 Inhaler 0   No facility-administered medications prior to visit.     Allergies  Allergen Reactions  . Oxycodone Other (See Comments)    Psych/mental adverse effects  . Pioglitazone Swelling and Rash    ROS As per HPI  PE: BP recheck manually 96/60. Blood pressure (!) 85/54, pulse 74, temperature 97.6 F (36.4 C), temperature source Oral, resp. rate 16, height 5' 11.5" (1.816 m), weight 229 lb 4 oz (104 kg), SpO2 99 %. Gen: Alert, well appearing.  Patient is oriented to person, place, time, and situation. AFFECT: pleasant, lucid thought and speech. CV: RRR, no m/r/g.  S1 and S2 distant. LUNGS: CTA bilat, nonlabored resps, good aeration in all lung fields. EXT: no clubbing, cyanosis, or edema.    LABS:  Lab Results  Component Value Date   TSH 2.42 02/27/2017   Lab Results  Component Value Date   WBC 10.6 (H) 02/27/2017   HGB 15.6 02/27/2017   HCT 46.8 02/27/2017   MCV 93.7 02/27/2017   PLT 260.0  02/27/2017   Lab Results  Component Value Date   CREATININE 1.09 02/27/2017   BUN 16 02/27/2017   NA 138 02/27/2017   K 4.5 02/27/2017   CL 103 02/27/2017   CO2 26 02/27/2017   Lab Results  Component Value Date   ALT 22 10/29/2016   AST 13 10/29/2016   ALKPHOS 53 10/29/2016   BILITOT 0.4 10/29/2016   Lab Results  Component Value Date   CHOL 121 02/27/2017   Lab Results  Component Value Date   HDL 44.90 02/27/2017   Lab Results  Component Value Date   LDLCALC 41 02/27/2016   Lab Results  Component Value Date   TRIG 202.0 (H) 02/27/2017  Lab Results  Component Value Date   CHOLHDL 3 02/27/2017   Lab Results  Component Value Date   PSA 1.71 02/27/2017   Lab Results  Component Value Date   HGBA1C 7.3 (H) 02/27/2017    IMPRESSION AND PLAN:  1) DM 2, improving. HbA1c today.  2) HTN; bp low today, recheck a bit better.  Denies sx's.  Cardiac exam normal. Reports home bp's in perfectly normal range.  No med changes/adjustments today. Continue home monitoring and call/return if consistently 90s/60s or worse.  3) HLD: tolerating statin.  FLP excellent 02/2017.  4) CRI with GFR around 60 ml/min: avoiding NSAIDs.  Has to take ASA and plavix for CAD. Hydrates well. Cr/lytes today.  An After Visit Summary was printed and given to the patient.  FOLLOW UP: Return in about 3 months (around 09/01/2017) for routine chronic illness f/u.  Signed:  Crissie Sickles, MD           06/01/2017

## 2017-06-18 ENCOUNTER — Other Ambulatory Visit: Payer: Self-pay | Admitting: Family Medicine

## 2017-06-25 ENCOUNTER — Encounter: Payer: Self-pay | Admitting: Cardiovascular Disease

## 2017-06-25 ENCOUNTER — Ambulatory Visit (INDEPENDENT_AMBULATORY_CARE_PROVIDER_SITE_OTHER): Payer: Medicare HMO | Admitting: Cardiovascular Disease

## 2017-06-25 VITALS — BP 144/90 | HR 64 | Ht 71.5 in | Wt 235.6 lb

## 2017-06-25 DIAGNOSIS — E782 Mixed hyperlipidemia: Secondary | ICD-10-CM | POA: Diagnosis not present

## 2017-06-25 DIAGNOSIS — I2510 Atherosclerotic heart disease of native coronary artery without angina pectoris: Secondary | ICD-10-CM | POA: Diagnosis not present

## 2017-06-25 NOTE — Patient Instructions (Signed)
Medication Instructions:  Your physician recommends that you continue on your current medications as directed. Please refer to the Current Medication list given to you today.   Labwork: TODAY - cholesterol, liver panel, basic metabolic panel   Testing/Procedures: None Ordered   Follow-Up: Your physician wants you to follow-up in: 1 year with Dr. Nahser.  You will receive a reminder letter in the mail two months in advance. If you don't receive a letter, please call our office to schedule the follow-up appointment.   If you need a refill on your cardiac medications before your next appointment, please call your pharmacy.   Thank you for choosing [facility]! Damion Kant, RN [phone]    

## 2017-06-25 NOTE — Progress Notes (Signed)
[name] Date of Birth  1956-06-16       [facility] [address], [address], [place], [place]  [address]   [place],   [address] [phone]     [phone]   Fax  [phone]    Fax [phone]  Problem List: 1. Coronary artery disease status post coronary artery bypass grafting ( Dec. 2014)  2. Type 2 diabetes mellitus 3. Hyperlipidemia 4. Atrial fibrillation - converted after being started on amiodarone.  History of Present Illness:  Mr. Sainsbury is a 61 year old gentleman who I have seen in the past. He was admitted on December 12 with a non-ST segment elevation myocardial infarction. He was found to have significant coronary artery disease and had coronary artery bypass grafting.  He developed paroxysmal atrial fibrillation following surgery. He was started on amiodarone. He converted to sinus rhythm and did not require cardioversion.    He was newly diagnosed with diabetes.   He is walking - 25 minutes yesterday.    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    December 08, 2013:  [name] is doing well.  Occasional  episodes of orthostatic hypotension. His glucose levels have been well-controlled.     Nov. 9, 2015:  [name] is doing well .  It has been almost one year since his coronary artery bypass grafting. 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   Aug. 10, 2016:  He is doing well.  Has some MSK pain in his sternum . glucoose is well controlled Is in a new band - 3 piece  Exercises regularly   Sept. 18, 2017:  New band,   Going to [place], [place]. To the [employer] " . Tribute festival to [employer] .  Doing well from a cardiac standpoint Has a cough - thinks its Lisinopril   Has bought a new guitar and a new house  Nov. 8, 2018:    [name] is doing well  Got a new motorcycle this year.  Has been riding  some and enjoying it quite a bit  No CP  Not in the band anymore.   Plays with his bass player - smaller gigs.   Current Outpatient Medications on File Prior to Visit  Medication Sig Dispense Refill  . albuterol (PROVENTIL HFA;VENTOLIN HFA) 108 (90 Base) MCG/ACT inhaler Inhale 2 puffs into the lungs every 4 (four) hours as needed for wheezing or shortness of breath. 1 Inhaler 1  . Alcohol Swabs PADS Please clean finger prior to checking blood sugar 100 each 12  . aspirin EC 81 MG EC tablet Take 1 tablet (81 mg total) by mouth daily.    Marland Kitchen atorvastatin (LIPITOR) 20 MG tablet Take 1 tablet (20 mg total) by mouth daily at 6 PM. 90 tablet 3  . citalopram (CELEXA) 20 MG tablet TAKE 1 TABLET (20 MG TOTAL) BY MOUTH DAILY. 90 tablet 3  . clopidogrel (PLAVIX) 75 MG tablet TAKE 1 TABLET (75 MG TOTAL) BY MOUTH DAILY WITH BREAKFAST. 90 tablet 0  . gabapentin (NEURONTIN) 300 MG capsule Take 1 capsule (300 mg total) by mouth at bedtime. 90 capsule 3  . glipiZIDE (GLUCOTROL XL) 10 MG 24 hr tablet Take 1 tablet (10 mg total) by mouth daily with breakfast. 90 tablet 1  . glucose blood test  strip Use as instructed 100 each 12  . LANCETS ULTRA THIN 30G MISC 1 Units by Does not apply route 4 (four) times daily. Breakfast, Lunch, Dinner, Bedtime 100 each 3  . LORazepam (ATIVAN) 0.5 MG tablet 1-2 tabs po q8h prn anxiety 30 tablet 5  . losartan (COZAAR) 50 MG tablet TAKE 1 TABLET BY MOUTH DAILY. PLEASE CALL AND SCHEDULE ONE YEAR FOLLOW UP APPT FOR FURTHER REFILLS 30 tablet 1  . metFORMIN (GLUCOPHAGE) 1000 MG tablet TAKE 1 TABLET (1,000 MG TOTAL) BY MOUTH 2 (TWO) TIMES DAILY WITH A MEAL. 180 tablet 1  . metoprolol tartrate (LOPRESSOR) 50 MG tablet TAKE 1 TABLET (50 MG TOTAL) BY MOUTH 2 (TWO) TIMES DAILY. 180 tablet 1  . sitaGLIPtin (JANUVIA) 100 MG tablet Take 1 tablet (100 mg total) by mouth daily. 30 tablet 6  . traZODone (DESYREL) 50 MG tablet TAKE 1 TO 2 TABLETS AT BEDTIME AS NEEDED FOR INSOMNIA 180 tablet 3   No  current facility-administered medications on file prior to visit.     Allergies  Allergen Reactions  . Oxycodone Other (See Comments)    Psych/mental adverse effects  . Pioglitazone Swelling and Rash    Past Medical History:  Diagnosis Date  . Chronic renal insufficiency, stage II (mild) 2014/2015   CrCl @ 60 ml/min  . Colon cancer screening 02/2017   Pt declined colonoscopy, but agreed to cologuard screening--this test was POSITIVE on 03/11/17---referred to GI 03/18/17.  . Diabetes ([place]) 07/2013  . Diabetic peripheral neuropathy associated with type 2 diabetes mellitus ([place])   . Erectile dysfunction    Dr. Tresa Moore (urol is also doing prostate ca screening).  Refractory to medications.  Pt likely to get penile implant as of urol f/u 01/2015.  Johney Maine hematuria 2017   Pt is to bring this up with his urologist as of 06/2016.  Marland Kitchen Hyperlipidemia   . Hypertension   . NSTEMI (non-ST elevated myocardial infarction) [facility]) 07/2013   CABG x 22 Jul 2013  . Obesity, Class I, BMI 30-34.9   . Paroxysmal atrial fibrillation ([place]) 07/2013   Started post-op CABG, converted on amiodarone--amiodarone stopped after a couple months    Past Surgical History:  Procedure Laterality Date  . NASAL FRACTURE SURGERY  1979  . SHOULDER SURGERY  1983   left; pins are in place  . TRANSTHORACIC ECHOCARDIOGRAM  07/2013   EF 40%+ wall motion abnl--in the context of acute NSTEMI    Social History   Tobacco Use  Smoking Status Never Smoker  Smokeless Tobacco Never Used    Social History   Substance and Sexual Activity  Alcohol Use No  . Alcohol/week: 0.0 oz    Family History  Problem Relation Age of Onset  . Heart attack Mother   . Heart disease Mother   . Diabetes Mother   . Heart disease Father   . Cancer Brother        lung and liver    Reviw of Systems:  Reviewed in the HPI.  All other systems are negative.  Physical Exam: Blood pressure (!) 144/90, pulse 64, height 5' 11.5" (1.816 m),  weight 235 lb 9.6 oz (106.9 kg).  GEN:  Well nourished, well developed in no acute distress HEENT: Normal NECK: No JVD; No carotid bruits LYMPHATICS: No lymphadenopathy CARDIAC: RRR , no murmurs, rubs, gallops RESPIRATORY:  Clear to auscultation without rales, wheezing or rhonchi  ABDOMEN: Soft, non-tender, non-distended MUSCULOSKELETAL:  No edema; No deformity  SKIN: Warm and dry  NEUROLOGIC:  Alert and oriented x 3   ECG:  Nov. 8, 2018:   Sinus rhythm at 62 with 1st degree AV block   Assessment / Plan:   1. Coronary artery disease status post coronary artery bypass grafting (Dec. 2014)  -exam is doing quite well.  He is not having any episodes of angina.  Continue current medications.  2. Hyperlipidemia -   Check labs today    4. Atrial fibrillation -he had atrial fibrillation postoperatively.  He has not had any recurrent A. Fib.  He is not on NOAC.    Will watch closely for any recurrent Afib.     Mertie Moores, MD  06/25/2017 4:27 PM    [facility],  [place], [place]  [address] Pager [phone] Phone: [phone]; Fax: [phone]

## 2017-06-26 LAB — BASIC METABOLIC PANEL
BUN/Creatinine Ratio: 14 (ref 10–24)
BUN: 13 mg/dL (ref 8–27)
CO2: 25 mmol/L (ref 20–29)
Calcium: 10.3 mg/dL — ABNORMAL HIGH (ref 8.6–10.2)
Chloride: 99 mmol/L (ref 96–106)
Creatinine, Ser: 0.95 mg/dL (ref 0.76–1.27)
GFR calc Af Amer: 99 mL/min/{1.73_m2} (ref >59)
GFR calc non Af Amer: 85 mL/min/{1.73_m2} (ref >59)
Glucose: 76 mg/dL (ref 65–99)
Potassium: 4.3 mmol/L (ref 3.5–5.2)
Sodium: 137 mmol/L (ref 134–144)

## 2017-06-26 LAB — HEPATIC FUNCTION PANEL
ALT: 24 IU/L (ref 0–44)
AST: 14 IU/L (ref 0–40)
Albumin: 4.4 g/dL (ref 3.6–4.8)
Alkaline Phosphatase: 64 IU/L (ref 39–117)
Bilirubin Total: 0.5 mg/dL (ref 0.0–1.2)
Bilirubin, Direct: 0.18 mg/dL (ref 0.00–0.40)
Total Protein: 6.9 g/dL (ref 6.0–8.5)

## 2017-06-26 LAB — LIPID PANEL
Chol/HDL Ratio: 2.1 ratio (ref 0.0–5.0)
Cholesterol, Total: 101 mg/dL (ref 100–199)
HDL: 47 mg/dL (ref >39)
LDL Calculated: 33 mg/dL (ref 0–99)
Triglycerides: 106 mg/dL (ref 0–149)
VLDL Cholesterol Cal: 21 mg/dL (ref 5–40)

## 2017-07-06 ENCOUNTER — Ambulatory Visit: Payer: Medicare HMO | Admitting: Internal Medicine

## 2017-07-07 ENCOUNTER — Other Ambulatory Visit: Payer: Self-pay | Admitting: Family Medicine

## 2017-07-12 ENCOUNTER — Encounter: Payer: Self-pay | Admitting: Family Medicine

## 2017-07-14 ENCOUNTER — Other Ambulatory Visit: Payer: Self-pay | Admitting: Cardiovascular Disease

## 2017-08-06 ENCOUNTER — Other Ambulatory Visit: Payer: Self-pay | Admitting: Family Medicine

## 2017-08-06 ENCOUNTER — Other Ambulatory Visit: Payer: Self-pay | Admitting: Cardiovascular Disease

## 2017-08-18 DIAGNOSIS — M65319 Trigger thumb, unspecified thumb: Secondary | ICD-10-CM

## 2017-08-18 HISTORY — DX: Trigger thumb, unspecified thumb: M65.319

## 2017-08-26 ENCOUNTER — Ambulatory Visit: Payer: Medicare HMO | Admitting: Internal Medicine

## 2017-09-01 ENCOUNTER — Ambulatory Visit (INDEPENDENT_AMBULATORY_CARE_PROVIDER_SITE_OTHER): Payer: Medicare HMO | Admitting: Family Medicine

## 2017-09-01 ENCOUNTER — Encounter: Payer: Self-pay | Admitting: Family Medicine

## 2017-09-01 VITALS — BP 126/88 | HR 66 | Temp 97.5°F | Resp 16 | Wt 237.0 lb

## 2017-09-01 DIAGNOSIS — E7800 Pure hypercholesterolemia, unspecified: Secondary | ICD-10-CM

## 2017-09-01 DIAGNOSIS — M65312 Trigger thumb, left thumb: Secondary | ICD-10-CM | POA: Diagnosis not present

## 2017-09-01 DIAGNOSIS — I10 Essential (primary) hypertension: Secondary | ICD-10-CM

## 2017-09-01 DIAGNOSIS — E118 Type 2 diabetes mellitus with unspecified complications: Secondary | ICD-10-CM | POA: Diagnosis not present

## 2017-09-01 DIAGNOSIS — N182 Chronic kidney disease, stage 2 (mild): Secondary | ICD-10-CM | POA: Diagnosis not present

## 2017-09-01 LAB — BASIC METABOLIC PANEL
BUN: 18 mg/dL (ref 6–23)
CO2: 29 mEq/L (ref 19–32)
Calcium: 9.4 mg/dL (ref 8.4–10.5)
Chloride: 102 mEq/L (ref 96–112)
Creatinine, Ser: 0.97 mg/dL (ref 0.40–1.50)
GFR: 83.05 mL/min (ref >60.00)
Glucose, Bld: 132 mg/dL — ABNORMAL HIGH (ref 70–99)
Potassium: 4.4 mEq/L (ref 3.5–5.1)
Sodium: 138 mEq/L (ref 135–145)

## 2017-09-01 LAB — HEMOGLOBIN A1C: Hgb A1c MFr Bld: 6.7 % — ABNORMAL HIGH (ref 4.6–6.5)

## 2017-09-01 NOTE — Progress Notes (Signed)
OFFICE VISIT  09/01/2017   CC:  Chief Complaint  Patient presents with  . Follow-up    RCI - Left thumb catching     HPI:    Patient is a 62 y.o. Caucasian male who presents for 3 mo f/u DM 2, CRI stage 2 (GFR 60s), HTN, HLD.  DM 2: home glucoses around 100 fasting.  Rare check in middle of day still around 100.  HTN: home monitoring consistently <130/80.  HLD: taking atorva qd.    CRI: taking asa and plavix.  No NSAID use, pays close attention to hydration.  Also c/o L thumb catching.  Onset about 3 weeks ago. No trigger finger in the past.   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    Past Medical History:  Diagnosis Date  . Chronic renal insufficiency, stage II (mild) 2014/2015   CrCl @ 60 ml/min  . Colon cancer screening 02/2017   Pt declined colonoscopy, but agreed to cologuard screening--this test was POSITIVE on 03/11/17---referred to GI 03/18/17.  . Coronary artery disease    CABG 2014.  . Diabetes ([place]) 07/2013  . Diabetic peripheral neuropathy associated with type 2 diabetes mellitus ([place])   . Erectile dysfunction    Dr. Tresa Moore (urol is also doing prostate ca screening).  Refractory to medications.  Pt likely to get penile implant as of urol f/u 01/2015.  Johney Maine hematuria 2017   Pt is to bring this up with his urologist as of 06/2016.  Marland Kitchen Hyperlipidemia   . Hypertension   . NSTEMI (non-ST elevated myocardial infarction) [facility]) 07/2013   CABG x 22 Jul 2013  . Obesity, Class I, BMI 30-34.9   . Paroxysmal atrial fibrillation ([place]) 07/2013   Started post-op CABG, converted on amiodarone--amiodarone stopped after a couple months    Past Surgical History:  Procedure Laterality Date  . CORONARY ARTERY BYPASS GRAFT N/A 08/02/2013   Procedure: CORONARY ARTERY BYPASS GRAFTING (CABG);  Surgeon: Grace Isaac, MD;  Location: [place];  Service: Open Heart Surgery;  Laterality: N/A;   Coronary artery bypass graft times five utilizing the left internal mammary artery and the right greater saphenous vein.  Marland Kitchen ENDOVEIN HARVEST OF GREATER SAPHENOUS VEIN Right 08/02/2013   Procedure: ENDOVEIN HARVEST OF GREATER SAPHENOUS VEIN;  Surgeon: Grace Isaac, MD;  Location: [place];  Service: Open Heart Surgery;  Laterality: Right;  . INTRAOPERATIVE TRANSESOPHAGEAL ECHOCARDIOGRAM N/A 08/02/2013   Procedure: INTRAOPERATIVE TRANSESOPHAGEAL ECHOCARDIOGRAM;  Surgeon: Grace Isaac, MD;  Location: [place];  Service: Open Heart Surgery;  Laterality: N/A;.  EF 40%, LVH and LV dilatation, inf wall hypokinesis (at the time of his MI)  . LEFT HEART CATHETERIZATION WITH CORONARY ANGIOGRAM N/A 08/01/2013   Procedure: LEFT HEART CATHETERIZATION WITH CORONARY ANGIOGRAM;  Surgeon: Blane Ohara, MD;  Location: [facility] CATH LAB;  Service: Cardiovascular;  Laterality: N/A;  . NASAL FRACTURE SURGERY  1979  . SHOULDER SURGERY  1983   left; pins are in place  . TRANSTHORACIC ECHOCARDIOGRAM  07/2013   EF 40%+ wall motion abnl--in the context of acute NSTEMI    Outpatient Medications Prior to Visit  Medication Sig Dispense Refill  . albuterol (PROVENTIL HFA;VENTOLIN HFA) 108 (90 Base) MCG/ACT inhaler Inhale 2 puffs into the lungs every 4 (four) hours as needed for wheezing or shortness of breath. 1 Inhaler 1  . Alcohol Swabs PADS Please clean finger prior to checking blood  sugar 100 each 12  . aspirin EC 81 MG EC tablet Take 1 tablet (81 mg total) by mouth daily.    Marland Kitchen atorvastatin (LIPITOR) 20 MG tablet Take 1 tablet (20 mg total) by mouth daily at 6 PM. 90 tablet 3  . citalopram (CELEXA) 20 MG tablet TAKE 1 TABLET (20 MG TOTAL) BY MOUTH DAILY. 90 tablet 3  . clopidogrel (PLAVIX) 75 MG tablet TAKE 1 TABLET (75 MG TOTAL) BY MOUTH DAILY WITH BREAKFAST. 90 tablet 3  . gabapentin (NEURONTIN) 300 MG capsule Take 1 capsule (300 mg total) by mouth at bedtime. 90 capsule 3  . glipiZIDE (GLUCOTROL XL) 10 MG 24 hr  tablet TAKE 1 TABLET BY MOUTH EVERY DAY 90 tablet 1  . glucose blood test strip Use as instructed 100 each 12  . LANCETS ULTRA THIN 30G MISC 1 Units by Does not apply route 4 (four) times daily. Breakfast, Lunch, Dinner, Bedtime 100 each 3  . LORazepam (ATIVAN) 0.5 MG tablet 1-2 tabs po q8h prn anxiety 30 tablet 5  . losartan (COZAAR) 50 MG tablet TAKE 1 TABLET BY MOUTH DAILY. PLEASE CALL AND SCHEDULE ONE YEAR FOLLOW UP APPT FOR FURTHER REFILLS 30 tablet 11  . metFORMIN (GLUCOPHAGE) 1000 MG tablet TAKE 1 TABLET (1,000 MG TOTAL) BY MOUTH 2 (TWO) TIMES DAILY WITH A MEAL. 180 tablet 1  . metoprolol tartrate (LOPRESSOR) 50 MG tablet TAKE 1 TABLET (50 MG TOTAL) BY MOUTH 2 (TWO) TIMES DAILY. 180 tablet 1  . sitaGLIPtin (JANUVIA) 100 MG tablet Take 1 tablet (100 mg total) by mouth daily. 30 tablet 6  . traZODone (DESYREL) 50 MG tablet TAKE 1 TO 2 TABLETS AT BEDTIME AS NEEDED FOR INSOMNIA 180 tablet 3   No facility-administered medications prior to visit.     Allergies  Allergen Reactions  . Oxycodone Other (See Comments)    Psych/mental adverse effects  . Pioglitazone Swelling and Rash    ROS As per HPI  PP:JKDTOIZ bp 147/88, repeat 126/88 Blood pressure 126/88, pulse 66, temperature (!) 97.5 F (36.4 C), temperature source Oral, resp. rate 16, weight 237 lb (107.5 kg), SpO2 97 %. Gen: Alert, well appearing.  Patient is oriented to person, place, time, and situation. AFFECT: pleasant, lucid thought and speech. CV: RRR, no m/r/g.   LUNGS: CTA bilat, nonlabored resps, good aeration in all lung fields. L hand: thumb with full ROM but a strong catch/trigger is noted at IP joint.  He can push through the triggering with significant effort.  No tenderness.  No nodule palpable.  LABS:  Lab Results  Component Value Date   TSH 2.42 02/27/2017   Lab Results  Component Value Date   WBC 10.6 (H) 02/27/2017   HGB 15.6 02/27/2017   HCT 46.8 02/27/2017   MCV 93.7 02/27/2017   PLT 260.0  02/27/2017   Lab Results  Component Value Date   CREATININE 0.95 06/25/2017   BUN 13 06/25/2017   NA 137 06/25/2017   K 4.3 06/25/2017   CL 99 06/25/2017   CO2 25 06/25/2017   Lab Results  Component Value Date   ALT 24 06/25/2017   AST 14 06/25/2017   ALKPHOS 64 06/25/2017   BILITOT 0.5 06/25/2017   Lab Results  Component Value Date   CHOL 101 06/25/2017   Lab Results  Component Value Date   HDL 47 06/25/2017   Lab Results  Component Value Date   LDLCALC 33 06/25/2017   Lab Results  Component Value Date  TRIG 106 06/25/2017   Lab Results  Component Value Date   CHOLHDL 2.1 06/25/2017   Lab Results  Component Value Date   PSA 1.71 02/27/2017   Lab Results  Component Value Date   HGBA1C 6.6 (H) 06/01/2017     IMPRESSION AND PLAN:  1) Left thumb trigger finger at IP joint, symptomatic. Refer to Dr. Amedeo Plenty, hand surgeon.  2) DM 2: well controlled. HbA1c today. BMET today. All other routine diabetic monitoring UTD at this time.  3) HTN: The current medical regimen is effective;  continue present plan and medications. BMET today.  4) HLD: tolerating statin.  LDL 33 on 06/2017.  5) CRI, GFR in 60s:  Avoids NSAIDs.  Pays close attention to good hydration.  An After Visit Summary was printed and given to the patient.  FOLLOW UP: Return in about 3 months (around 11/30/2017) for routine chronic illness f/u.  Signed:  Crissie Sickles, MD           09/01/2017

## 2017-09-03 ENCOUNTER — Encounter: Payer: Self-pay | Admitting: Family Medicine

## 2017-09-18 DIAGNOSIS — M720 Palmar fascial fibromatosis [Dupuytren]: Secondary | ICD-10-CM

## 2017-09-18 HISTORY — DX: Palmar fascial fibromatosis (dupuytren): M72.0

## 2017-09-23 ENCOUNTER — Other Ambulatory Visit: Payer: Self-pay

## 2017-09-23 MED ORDER — SITAGLIPTIN PHOSPHATE 100 MG PO TABS: 100.0000 mg | ORAL_TABLET | Freq: Every day | ORAL | 1 refills | Status: DC

## 2017-09-29 ENCOUNTER — Other Ambulatory Visit: Payer: Self-pay | Admitting: *Deleted

## 2017-09-29 MED ORDER — LORAZEPAM 0.5 MG PO TABS: ORAL_TABLET | ORAL | 5 refills | Status: DC

## 2017-09-29 NOTE — Telephone Encounter (Signed)
[employer]  RF request for lorazepam LOV: 09/01/17 Next ov: 11/30/17 Last written: 04/07/17 #30 w/ 5RF - faxed to [place]  Please advise. Thanks.

## 2017-09-30 ENCOUNTER — Other Ambulatory Visit: Payer: Self-pay

## 2017-09-30 MED ORDER — GABAPENTIN 300 MG PO CAPS: 300.0000 mg | ORAL_CAPSULE | Freq: Every day | ORAL | 3 refills | Status: DC

## 2017-09-30 MED ORDER — ALBUTEROL SULFATE HFA 108 (90 BASE) MCG/ACT IN AERS: 2.0000 | INHALATION_SPRAY | RESPIRATORY_TRACT | 1 refills | Status: AC | PRN

## 2017-09-30 MED ORDER — SITAGLIPTIN PHOSPHATE 100 MG PO TABS: 100.0000 mg | ORAL_TABLET | Freq: Every day | ORAL | 1 refills | Status: DC

## 2017-09-30 MED ORDER — GLIPIZIDE ER 10 MG PO TB24: 10.0000 mg | ORAL_TABLET | Freq: Every day | ORAL | 1 refills | Status: DC

## 2017-09-30 MED ORDER — METFORMIN HCL 1000 MG PO TABS: 1000.0000 mg | ORAL_TABLET | Freq: Two times a day (BID) | ORAL | 1 refills | Status: DC

## 2017-09-30 MED ORDER — ACCU-CHEK AVIVA VI SOLN: 1.0000 | 0 refills | Status: AC | PRN

## 2017-09-30 MED ORDER — ACCU-CHEK AVIVA PLUS W/DEVICE KIT: 1.0000 | PACK | Freq: Three times a day (TID) | 0 refills | Status: DC

## 2017-09-30 MED ORDER — LOSARTAN POTASSIUM 50 MG PO TABS: ORAL_TABLET | ORAL | 2 refills | Status: DC

## 2017-09-30 MED ORDER — ACCU-CHEK SOFTCLIX LANCETS MISC: 12 refills | Status: DC

## 2017-09-30 MED ORDER — CITALOPRAM HYDROBROMIDE 20 MG PO TABS: ORAL_TABLET | ORAL | 3 refills | Status: DC

## 2017-09-30 MED ORDER — ATORVASTATIN CALCIUM 20 MG PO TABS: 20.0000 mg | ORAL_TABLET | Freq: Every day | ORAL | 3 refills | Status: DC

## 2017-09-30 MED ORDER — TRAZODONE HCL 50 MG PO TABS: ORAL_TABLET | ORAL | 3 refills | Status: DC

## 2017-09-30 MED ORDER — METOPROLOL TARTRATE 50 MG PO TABS: 50.0000 mg | ORAL_TABLET | Freq: Two times a day (BID) | ORAL | 1 refills | Status: DC

## 2017-09-30 MED ORDER — ALCOHOL SWABS PADS: MEDICATED_PAD | 12 refills | Status: DC

## 2017-09-30 MED ORDER — CLOPIDOGREL BISULFATE 75 MG PO TABS: 75.0000 mg | ORAL_TABLET | Freq: Every day | ORAL | 2 refills | Status: DC

## 2017-09-30 MED ORDER — GLUCOSE BLOOD VI STRP: ORAL_STRIP | 12 refills | Status: DC

## 2017-09-30 NOTE — Telephone Encounter (Signed)
Rx faxed

## 2017-10-02 MED ORDER — LOSARTAN POTASSIUM 50 MG PO TABS: ORAL_TABLET | ORAL | 3 refills | Status: DC

## 2017-10-02 MED ORDER — BD SWAB SINGLE USE REGULAR PADS: MEDICATED_PAD | 12 refills | Status: DC

## 2017-10-02 MED ORDER — CLOPIDOGREL BISULFATE 75 MG PO TABS: 75.0000 mg | ORAL_TABLET | Freq: Every day | ORAL | 3 refills | Status: DC

## 2017-10-02 NOTE — Addendum Note (Signed)
Addended by: Onalee Hua on: 10/02/2017 11:57 AM   Modules accepted: Orders

## 2017-10-02 NOTE — Addendum Note (Signed)
Addended by: Derl Barrow on: 10/02/2017 10:45 AM   Modules accepted: Orders

## 2017-10-06 ENCOUNTER — Other Ambulatory Visit: Payer: Self-pay | Admitting: Family Medicine

## 2017-10-06 NOTE — Telephone Encounter (Signed)
SW pt, he stated that [facility] is asking about Rx for alcohol swabs, Rx was sent on 10/02/17. I advised pt that I would contact [employer] to see if they received this Rx. Pt voiced understanding.

## 2017-10-06 NOTE — Telephone Encounter (Signed)
Copied from [place]. Topic: Quick Communication - See Telephone Encounter >> Oct 06, 2017  4:15 PM Robina Ade, Helene Kelp D wrote: CRM for notification. See Telephone encounter for: 10/06/17. Patient called and said that he would like to talk to someone regarding having a form fax from the office to [facility] mail order so he can get his medication with them. Please call patient back, thanks.

## 2017-10-06 NOTE — Telephone Encounter (Signed)
Copied from [place] [id number]. Topic: Quick Communication - Rx Refill/Question >> Oct 06, 2017  6:42 PM Oliver Pila B wrote: Medication: LORazepam (ATIVAN) 0.5 MG tablet [[id number]], albuterol (PROVENTIL HFA;VENTOLIN HFA) 108 (90 Base) MCG/ACT inhaler [[id number]]    Has the patient contacted their pharmacy? Yes.     (Agent: If no, request that the patient contact the pharmacy for the refill.)   Preferred Pharmacy (with phone number or street [facility]): [facility]   Agent: Please be advised that RX refills may take up to 3 business days. We ask that you follow-up with your pharmacy.

## 2017-10-07 NOTE — Telephone Encounter (Signed)
[facility], they received all Rx's sent on 09/30/17 and 10/02/17, some have been shipped on 10/05/17 and 10/07/17, some are too early for refill. As for the lorazepam that was faxed to pts local pharmacy.   Left message for pt to call back.

## 2017-10-07 NOTE — Telephone Encounter (Signed)
Patient advised, voiced understanding.

## 2017-10-12 DIAGNOSIS — M65312 Trigger thumb, left thumb: Secondary | ICD-10-CM | POA: Diagnosis not present

## 2017-10-19 ENCOUNTER — Telehealth: Payer: Self-pay | Admitting: Internal Medicine

## 2017-10-19 ENCOUNTER — Ambulatory Visit: Payer: Medicare HMO | Admitting: Internal Medicine

## 2017-10-19 NOTE — Telephone Encounter (Signed)
Noted  

## 2017-11-06 ENCOUNTER — Other Ambulatory Visit: Payer: Self-pay | Admitting: Family Medicine

## 2017-11-09 ENCOUNTER — Encounter: Payer: Self-pay | Admitting: Family Medicine

## 2017-11-09 DIAGNOSIS — M720 Palmar fascial fibromatosis [Dupuytren]: Secondary | ICD-10-CM | POA: Diagnosis not present

## 2017-11-30 ENCOUNTER — Ambulatory Visit: Payer: Medicare HMO | Admitting: Family Medicine

## 2017-12-04 ENCOUNTER — Other Ambulatory Visit: Payer: Self-pay | Admitting: Family Medicine

## 2017-12-06 ENCOUNTER — Other Ambulatory Visit: Payer: Self-pay | Admitting: Family Medicine

## 2017-12-14 ENCOUNTER — Ambulatory Visit: Payer: Medicare HMO | Admitting: Family Medicine

## 2017-12-14 ENCOUNTER — Encounter: Payer: Self-pay | Admitting: Family Medicine

## 2017-12-14 VITALS — BP 121/71 | HR 71 | Resp 16 | Ht 71.5 in | Wt 232.0 lb

## 2017-12-14 DIAGNOSIS — I10 Essential (primary) hypertension: Secondary | ICD-10-CM | POA: Diagnosis not present

## 2017-12-14 DIAGNOSIS — E118 Type 2 diabetes mellitus with unspecified complications: Secondary | ICD-10-CM | POA: Diagnosis not present

## 2017-12-14 DIAGNOSIS — N182 Chronic kidney disease, stage 2 (mild): Secondary | ICD-10-CM | POA: Diagnosis not present

## 2017-12-14 DIAGNOSIS — E7800 Pure hypercholesterolemia, unspecified: Secondary | ICD-10-CM

## 2017-12-14 LAB — BASIC METABOLIC PANEL
BUN: 32 mg/dL — ABNORMAL HIGH (ref 6–23)
CO2: 30 mEq/L (ref 19–32)
Calcium: 9.9 mg/dL (ref 8.4–10.5)
Chloride: 103 mEq/L (ref 96–112)
Creatinine, Ser: 1.09 mg/dL (ref 0.40–1.50)
GFR: 72.52 mL/min (ref >60.00)
Glucose, Bld: 81 mg/dL (ref 70–99)
Potassium: 4.4 mEq/L (ref 3.5–5.1)
Sodium: 141 mEq/L (ref 135–145)

## 2017-12-14 LAB — HEMOGLOBIN A1C: Hgb A1c MFr Bld: 6.5 % (ref 4.6–6.5)

## 2017-12-14 NOTE — Progress Notes (Signed)
OFFICE VISIT  12/14/2017   CC:  Chief Complaint  Patient presents with  . Follow-up    RCI   HPI:    Patient is a 62 y.o. Caucasian male who presents for 3 mo f/u DM 2, HTN, HLD, CRI with GFR 60 ml/min.  DM:  Walking with dog regularly, will be starting treadmill at home 3 d/week. Normally 70s-90.  Highest 147.    HTN: occ home bp monitoring consistently <130/80.  HLD: taking statin, no side effects.  CRI: Avoids aleve and advil as much as possible.  Hydrates well with water.  ROS: focal pain in L chest wall and L mid back. Lifted heavy musical equipment.  Not hurting currently. No SOB, no CP, no cough, no melena or hematochezia.  Past Medical History:  Diagnosis Date  . Chronic renal insufficiency, stage II (mild) 2014/2015   CrCl @ 60 ml/min  . Colon cancer screening 02/2017   Pt declined colonoscopy, but agreed to cologuard screening--this test was POSITIVE on 03/11/17---referred to GI 03/18/17.  . Coronary artery disease    CABG 2014.  . Diabetes ([place]) 07/2013  . Diabetic peripheral neuropathy associated with type 2 diabetes mellitus ([place])   . Dupuytren's disease 09/2017   ortho 09/2017, L hand  . Erectile dysfunction    Dr. Tresa Moore (urol is also doing prostate ca screening).  Refractory to medications.  Pt likely to get penile implant as of urol f/u 01/2015.  Johney Maine hematuria 2017   Pt is to bring this up with his urologist as of 06/2016.  Marland Kitchen Hyperlipidemia   . Hypertension   . NSTEMI (non-ST elevated myocardial infarction) [facility]) 07/2013   CABG x 22 Jul 2013  . Obesity, Class I, BMI 30-34.9   . Paroxysmal atrial fibrillation ([place]) 07/2013   Started post-op CABG, converted on amiodarone--amiodarone stopped after a couple months  . Stenosing tenosynovitis of thumb 2019   left; incarcerated in extension (ortho visit 09/2017--steroid injection).    Past Surgical History:  Procedure Laterality Date  . CORONARY ARTERY BYPASS GRAFT N/A 08/02/2013   Procedure: CORONARY  ARTERY BYPASS GRAFTING (CABG);  Surgeon: Grace Isaac, MD;  Location: [place];  Service: Open Heart Surgery;  Laterality: N/A;  Coronary artery bypass graft times five utilizing the left internal mammary artery and the right greater saphenous vein.  Marland Kitchen ENDOVEIN HARVEST OF GREATER SAPHENOUS VEIN Right 08/02/2013   Procedure: ENDOVEIN HARVEST OF GREATER SAPHENOUS VEIN;  Surgeon: Grace Isaac, MD;  Location: [place];  Service: Open Heart Surgery;  Laterality: Right;  . INTRAOPERATIVE TRANSESOPHAGEAL ECHOCARDIOGRAM N/A 08/02/2013   Procedure: INTRAOPERATIVE TRANSESOPHAGEAL ECHOCARDIOGRAM;  Surgeon: Grace Isaac, MD;  Location: [place];  Service: Open Heart Surgery;  Laterality: N/A;.  EF 40%, LVH and LV dilatation, inf wall hypokinesis (at the time of his MI)  . LEFT HEART CATHETERIZATION WITH CORONARY ANGIOGRAM N/A 08/01/2013   Procedure: LEFT HEART CATHETERIZATION WITH CORONARY ANGIOGRAM;  Surgeon: Blane Ohara, MD;  Location: [facility] CATH LAB;  Service: Cardiovascular;  Laterality: N/A;  . NASAL FRACTURE SURGERY  1979  . SHOULDER SURGERY  1983   left; pins are in place  . TRANSTHORACIC ECHOCARDIOGRAM  07/2013   EF 40%+ wall motion abnl--in the context of acute NSTEMI    Outpatient Medications Prior to Visit  Medication Sig Dispense Refill  . ACCU-CHEK SOFTCLIX LANCETS lancets Use as instructed 100 each 12  . albuterol (PROVENTIL HFA;VENTOLIN HFA) 108 (90 Base) MCG/ACT inhaler Inhale 2 puffs into the lungs  every 4 (four) hours as needed for wheezing or shortness of breath. 1 Inhaler 1  . Alcohol Swabs (B-D SINGLE USE SWABS REGULAR) PADS Use to check blood sugar three times daily 100 each 12  . aspirin EC 81 MG EC tablet Take 1 tablet (81 mg total) by mouth daily.    Marland Kitchen atorvastatin (LIPITOR) 20 MG tablet Take 1 tablet (20 mg total) by mouth daily at 6 PM. 90 tablet 3  . Blood Glucose Calibration (ACCU-CHEK AVIVA) SOLN 1 Bottle by In Vitro route as needed. 1 each 0  . Blood Glucose  Monitoring Suppl (ACCU-CHEK AVIVA PLUS) w/Device KIT 1 each by Does not apply route 3 (three) times daily. 1 kit 0  . citalopram (CELEXA) 20 MG tablet TAKE 1 TABLET (20 MG TOTAL) BY MOUTH DAILY. 90 tablet 3  . clopidogrel (PLAVIX) 75 MG tablet Take 1 tablet (75 mg total) by mouth daily with breakfast. 90 tablet 3  . gabapentin (NEURONTIN) 300 MG capsule Take 1 capsule (300 mg total) by mouth at bedtime. 90 capsule 3  . glipiZIDE (GLUCOTROL XL) 10 MG 24 hr tablet Take 1 tablet (10 mg total) by mouth daily. 90 tablet 1  . glucose blood (ACCU-CHEK AVIVA PLUS) test strip Use as instructed 100 each 12  . glucose blood test strip Use as instructed 100 each 12  . LANCETS ULTRA THIN 30G MISC 1 Units by Does not apply route 4 (four) times daily. Breakfast, Lunch, Dinner, Bedtime 100 each 3  . LORazepam (ATIVAN) 0.5 MG tablet 1-2 tabs po q8h prn anxiety 30 tablet 5  . losartan (COZAAR) 50 MG tablet TAKE 1 TABLET BY MOUTH DAILY. 90 tablet 3  . meloxicam (MOBIC) 15 MG tablet Take 15 mg by mouth daily. with food  1  . metFORMIN (GLUCOPHAGE) 1000 MG tablet Take 1 tablet (1,000 mg total) by mouth 2 (two) times daily with a meal. 180 tablet 1  . metoprolol tartrate (LOPRESSOR) 50 MG tablet Take 1 tablet (50 mg total) by mouth 2 (two) times daily. 180 tablet 1  . sitaGLIPtin (JANUVIA) 100 MG tablet Take 1 tablet (100 mg total) by mouth daily. 90 tablet 1  . traZODone (DESYREL) 50 MG tablet TAKE 1 TO 2 TABLETS AT BEDTIME AS NEEDED FOR INSOMNIA 180 tablet 3   No facility-administered medications prior to visit.     Allergies  Allergen Reactions  . Oxycodone Other (See Comments)    Psych/mental adverse effects  . Pioglitazone Swelling and Rash    ROS As per HPI  PE: Blood pressure 121/71, pulse 71, resp. rate 16, height 5' 11.5" (1.816 m), weight 232 lb (105.2 kg), SpO2 97 %. Gen: Alert, well appearing.  Patient is oriented to person, place, time, and situation. AFFECT: pleasant, lucid thought and  speech. CV: RRR, no m/r/g.   LUNGS: CTA bilat, nonlabored resps, good aeration in all lung fields. EXT: no clubbing, cyanosis, or edema.    LABS:  Lab Results  Component Value Date   TSH 2.42 02/27/2017   Lab Results  Component Value Date   WBC 10.6 (H) 02/27/2017   HGB 15.6 02/27/2017   HCT 46.8 02/27/2017   MCV 93.7 02/27/2017   PLT 260.0 02/27/2017   Lab Results  Component Value Date   CREATININE 0.97 09/01/2017   BUN 18 09/01/2017   NA 138 09/01/2017   K 4.4 09/01/2017   CL 102 09/01/2017   CO2 29 09/01/2017   Lab Results  Component Value Date   ALT  24 06/25/2017   AST 14 06/25/2017   ALKPHOS 64 06/25/2017   BILITOT 0.5 06/25/2017   Lab Results  Component Value Date   CHOL 101 06/25/2017   Lab Results  Component Value Date   HDL 47 06/25/2017   Lab Results  Component Value Date   LDLCALC 33 06/25/2017   Lab Results  Component Value Date   TRIG 106 06/25/2017   Lab Results  Component Value Date   CHOLHDL 2.1 06/25/2017   Lab Results  Component Value Date   PSA 1.71 02/27/2017   Lab Results  Component Value Date   HGBA1C 6.7 (H) 09/01/2017    IMPRESSION AND PLAN:  1) DM, well controlled. hba1c today and BMET.  2) HTN: The current medical regimen is effective;  continue present plan and medications. Lytes/cr today.  3) HLD: tolerating statin.  LDL excellent 06/2017. Plan repeat 06/2018.  4) CRI II (GFR 60 ml/min): emphasized the importance of avoiding all otc or rx NSAIDs (particularly since he is already on ASA and Plavix). He hydrates well.    An After Visit Summary was printed and given to the patient.  FOLLOW UP: Return in about 3 months (around 03/15/2018) for annual CPE (fasting).  Signed:  Crissie Sickles, MD           12/14/2017

## 2017-12-23 ENCOUNTER — Other Ambulatory Visit: Payer: Self-pay | Admitting: Family Medicine

## 2017-12-24 ENCOUNTER — Telehealth: Payer: Self-pay

## 2017-12-24 NOTE — Telephone Encounter (Signed)
Phone call returned, patient stated that he got phone call yesterday regarding medication. Heather informed and we received request for RF on trazodone from [facility]. Patient has refills at [facility]. Patient was not aware of request from [facility] and will continue getting medications from [facility].

## 2017-12-24 NOTE — Telephone Encounter (Signed)
Copied from [place] [id number]. Topic: Inquiry >> Dec 24, 2017  1:45 PM Ether Griffins B wrote: Reason for CRM: pt is returning [facility] call.   >> Dec 24, 2017  2:37 PM Onalee Hua, CMA wrote: I think pt is returning you call in regards to labs? You spoke w/ his girl friend on 12/15/17.

## 2017-12-28 ENCOUNTER — Telehealth: Payer: Self-pay | Admitting: *Deleted

## 2017-12-28 ENCOUNTER — Encounter: Payer: Self-pay | Admitting: Internal Medicine

## 2017-12-28 ENCOUNTER — Ambulatory Visit: Payer: Medicare HMO | Admitting: Internal Medicine

## 2017-12-28 VITALS — BP 120/76 | HR 80 | Ht 74.0 in | Wt 234.4 lb

## 2017-12-28 DIAGNOSIS — R195 Other fecal abnormalities: Secondary | ICD-10-CM

## 2017-12-28 MED ORDER — SUPREP BOWEL PREP KIT 17.5-3.13-1.6 GM/177ML PO SOLN: 1.0000 | ORAL | 0 refills | Status: DC

## 2017-12-28 NOTE — Telephone Encounter (Signed)
Request for surgical clearance:     Endoscopy Procedure  What type of surgery is being performed?     colonoscopy  When is this surgery scheduled?     01/28/18  What type of clearance is required ?   Pharmacy  Are there any medications that need to be held prior to surgery and how long? Plavix, 5 days  Practice name and name of physician performing surgery?      [facility]  What is your office phone and fax number?      Phone- [phone]  [phone]  Anesthesia type (None, local, MAC, general) ?       MAC

## 2017-12-28 NOTE — Progress Notes (Signed)
Patient ID: Alex Yang, male   DOB: 08-14-1956, 62 y.o.   MRN: [id number] HPI: Alex Yang is a 62 year old male with a past medical history of CAD status post CABG on Plavix, chronic renal insufficiency, hypertension, hyperlipidemia diabetes who is seen in consultation at the request of Dr. Anitra Lauth to discuss positive Cologuard.  He is here alone today.  I saw him in  June 2017 to discuss average risk screening for colon cancer in the setting of Plavix.  He later canceled this colonoscopy because he was in paneled to a grand jury.  He never rescheduled the test but had a positive Cologuard test ordered in July 2018.  He denies GI complaint.  No visible blood in his stool or melena.  No abdominal pain.  No change in bowel habits.  No trouble with frequent diarrhea or constipation.  He denies upper GI and hepatobiliary complaint.  He does use Plavix on a daily basis.  Family history negative for colon cancer.  No tobacco use.  Past Medical History:  Diagnosis Date  . Chronic renal insufficiency, stage II (mild) 2014/2015   CrCl @ 60 ml/min  . Colon cancer screening 02/2017   Pt declined colonoscopy, but agreed to cologuard screening--this test was POSITIVE on 03/11/17---referred to GI 03/18/17.  . Coronary artery disease    CABG 2014.  . Diabetes ([place]) 07/2013  . Diabetic peripheral neuropathy associated with type 2 diabetes mellitus ([place])   . Dupuytren's disease 09/2017   ortho 09/2017, L hand  . Erectile dysfunction    Dr. Tresa Moore (urol is also doing prostate ca screening).  Refractory to medications.  Pt likely to get penile implant as of urol f/u 01/2015.  Alex Yang hematuria 2017   Pt is to bring this up with his urologist as of 06/2016.  Marland Kitchen Hyperlipidemia   . Hypertension   . NSTEMI (non-ST elevated myocardial infarction) [facility]) 07/2013   CABG x 22 Jul 2013  . Obesity, Class I, BMI 30-34.9   . Paroxysmal atrial fibrillation ([place]) 07/2013   Started post-op CABG, converted on  amiodarone--amiodarone stopped after a couple months  . Stenosing tenosynovitis of thumb 2019   left; incarcerated in extension (ortho visit 09/2017--steroid injection).    Past Surgical History:  Procedure Laterality Date  . CORONARY ARTERY BYPASS GRAFT N/A 08/02/2013   Procedure: CORONARY ARTERY BYPASS GRAFTING (CABG);  Surgeon: Grace Isaac, MD;  Location: [place];  Service: Open Heart Surgery;  Laterality: N/A;  Coronary artery bypass graft times five utilizing the left internal mammary artery and the right greater saphenous vein.  Marland Kitchen ENDOVEIN HARVEST OF GREATER SAPHENOUS VEIN Right 08/02/2013   Procedure: ENDOVEIN HARVEST OF GREATER SAPHENOUS VEIN;  Surgeon: Grace Isaac, MD;  Location: [place];  Service: Open Heart Surgery;  Laterality: Right;  . INTRAOPERATIVE TRANSESOPHAGEAL ECHOCARDIOGRAM N/A 08/02/2013   Procedure: INTRAOPERATIVE TRANSESOPHAGEAL ECHOCARDIOGRAM;  Surgeon: Grace Isaac, MD;  Location: [place];  Service: Open Heart Surgery;  Laterality: N/A;.  EF 40%, LVH and LV dilatation, inf wall hypokinesis (at the time of his MI)  . LEFT HEART CATHETERIZATION WITH CORONARY ANGIOGRAM N/A 08/01/2013   Procedure: LEFT HEART CATHETERIZATION WITH CORONARY ANGIOGRAM;  Surgeon: Blane Ohara, MD;  Location: [facility] CATH LAB;  Service: Cardiovascular;  Laterality: N/A;  . NASAL FRACTURE SURGERY  1979  . SHOULDER SURGERY  1983   left; pins are in place  . TRANSTHORACIC ECHOCARDIOGRAM  07/2013   EF 40%+ wall motion abnl--in the context of acute  NSTEMI    Outpatient Medications Prior to Visit  Medication Sig Dispense Refill  . ACCU-CHEK SOFTCLIX LANCETS lancets Use as instructed 100 each 12  . albuterol (PROVENTIL HFA;VENTOLIN HFA) 108 (90 Base) MCG/ACT inhaler Inhale 2 puffs into the lungs every 4 (four) hours as needed for wheezing or shortness of breath. 1 Inhaler 1  . Alcohol Swabs (B-D SINGLE USE SWABS REGULAR) PADS Use to check blood sugar three times daily 100 each 12  .  aspirin EC 81 MG EC tablet Take 1 tablet (81 mg total) by mouth daily.    Marland Kitchen atorvastatin (LIPITOR) 20 MG tablet Take 1 tablet (20 mg total) by mouth daily at 6 PM. 90 tablet 3  . Blood Glucose Calibration (ACCU-CHEK AVIVA) SOLN 1 Bottle by In Vitro route as needed. 1 each 0  . Blood Glucose Monitoring Suppl (ACCU-CHEK AVIVA PLUS) w/Device KIT 1 each by Does not apply route 3 (three) times daily. 1 kit 0  . citalopram (CELEXA) 20 MG tablet TAKE 1 TABLET (20 MG TOTAL) BY MOUTH DAILY. 90 tablet 3  . clopidogrel (PLAVIX) 75 MG tablet Take 1 tablet (75 mg total) by mouth daily with breakfast. 90 tablet 3  . gabapentin (NEURONTIN) 300 MG capsule Take 1 capsule (300 mg total) by mouth at bedtime. 90 capsule 3  . glipiZIDE (GLUCOTROL XL) 10 MG 24 hr tablet Take 1 tablet (10 mg total) by mouth daily. 90 tablet 1  . glucose blood (ACCU-CHEK AVIVA PLUS) test strip Use as instructed 100 each 12  . glucose blood test strip Use as instructed 100 each 12  . LANCETS ULTRA THIN 30G MISC 1 Units by Does not apply route 4 (four) times daily. Breakfast, Lunch, Dinner, Bedtime 100 each 3  . LORazepam (ATIVAN) 0.5 MG tablet 1-2 tabs po q8h prn anxiety 30 tablet 5  . losartan (COZAAR) 50 MG tablet TAKE 1 TABLET BY MOUTH DAILY. 90 tablet 3  . metFORMIN (GLUCOPHAGE) 1000 MG tablet Take 1 tablet (1,000 mg total) by mouth 2 (two) times daily with a meal. 180 tablet 1  . metoprolol tartrate (LOPRESSOR) 50 MG tablet Take 1 tablet (50 mg total) by mouth 2 (two) times daily. 180 tablet 1  . sitaGLIPtin (JANUVIA) 100 MG tablet Take 1 tablet (100 mg total) by mouth daily. 90 tablet 1  . traZODone (DESYREL) 50 MG tablet TAKE 1 TO 2 TABLETS AT BEDTIME AS NEEDED FOR INSOMNIA 180 tablet 3  . meloxicam (MOBIC) 15 MG tablet Take 15 mg by mouth daily. with food  1   No facility-administered medications prior to visit.     Allergies  Allergen Reactions  . Oxycodone Other (See Comments)    Psych/mental adverse effects  .  Pioglitazone Swelling and Rash    Family History  Problem Relation Age of Onset  . Heart attack Mother   . Heart disease Mother   . Diabetes Mother   . Heart disease Father   . Cancer Brother        lung and liver    Social History   Tobacco Use  . Smoking status: Never Smoker  . Smokeless tobacco: Never Used  Substance Use Topics  . Alcohol use: No    Alcohol/week: 0.0 oz  . Drug use: No    ROS: As per history of present illness, otherwise negative  BP 120/76   Pulse 80   Ht 6' 2"  (1.88 m)   Wt 234 lb 6 oz (106.3 kg)   BMI 30.09 kg/m  Constitutional: Well-developed and well-nourished. No distress. HEENT: Normocephalic and atraumatic.  Conjunctivae are normal.  No scleral icterus. Neck: Neck supple. Trachea midline. Cardiovascular: Normal rate, regular rhythm and intact distal pulses. No M/R/G Pulmonary/chest: Effort normal and breath sounds normal. No wheezing, rales or rhonchi. Abdominal: Soft, nontender, nondistended. Bowel sounds active throughout. There are no masses palpable. No hepatosplenomegaly. Extremities: no clubbing, cyanosis, or edema Neurological: Alert and oriented to person place and time. Skin: Skin is warm and dry.  Psychiatric: Normal mood and affect. Behavior is normal.  RELEVANT LABS AND IMAGING: CBC    Component Value Date/Time   WBC 10.6 (H) 02/27/2017 1055   RBC 4.99 02/27/2017 1055   HGB 15.6 02/27/2017 1055   HCT 46.8 02/27/2017 1055   PLT 260.0 02/27/2017 1055   MCV 93.7 02/27/2017 1055   MCH 31.4 08/05/2013 0450   MCHC 33.4 02/27/2017 1055   RDW 13.5 02/27/2017 1055   LYMPHSABS 3.6 02/27/2017 1055   MONOABS 0.9 02/27/2017 1055   EOSABS 0.4 02/27/2017 1055   BASOSABS 0.1 02/27/2017 1055    CMP     Component Value Date/Time   NA 141 12/14/2017 1006   NA 137 06/25/2017 1644   K 4.4 12/14/2017 1006   CL 103 12/14/2017 1006   CO2 30 12/14/2017 1006   GLUCOSE 81 12/14/2017 1006   BUN 32 (H) 12/14/2017 1006   BUN 13  06/25/2017 1644   CREATININE 1.09 12/14/2017 1006   CALCIUM 9.9 12/14/2017 1006   PROT 6.9 06/25/2017 1644   ALBUMIN 4.4 06/25/2017 1644   AST 14 06/25/2017 1644   ALT 24 06/25/2017 1644   ALKPHOS 64 06/25/2017 1644   BILITOT 0.5 06/25/2017 1644   GFRNONAA 85 06/25/2017 1644   GFRAA 99 06/25/2017 1644    ASSESSMENT/PLAN: 62 year old male with a past medical history of CAD status post CABG on Plavix, chronic renal insufficiency, hypertension, hyperlipidemia diabetes who is seen in consultation at the request of Dr. Anitra Lauth to discuss positive Cologuard.  1.  Positive Cologuard --colonoscopy strongly recommended in the setting of positive Cologuard.  No prior screening.  We discussed the risk, benefits and alternatives and he is agreeable to proceed.  We will contact cardiology regarding holding Plavix.  Hold Plavix 5 days before procedure - will instruct when and how to resume after procedure. Risks and benefits of procedure including bleeding, perforation, infection, missed lesions, medication reactions and possible hospitalization or surgery if complications occur explained. Additional rare but real risk of cardiovascular event such as heart attack or ischemia/infarct of other organs off Plavix explained and need to seek urgent help if this occurs. Will communicate by phone or EMR with patient's prescribing provider that to confirm holding Plavix is reasonable in this case.      HY:IFOYDXA, Adrian Blackwater, Md [address], [place] [id number]

## 2017-12-28 NOTE — Patient Instructions (Addendum)
You have been scheduled for a colonoscopy. Please follow written instructions given to you at your visit today.  Please pick up your prep supplies at the pharmacy within the next 1-3 days. If you use inhalers (even only as needed), please bring them with you on the day of your procedure. Your physician has requested that you go to www.startemmi.com and enter the access code given to you at your visit today. This web site gives a general overview about your procedure. However, you should still follow specific instructions given to you by our office regarding your preparation for the procedure.  HOLD METFORMIN, GLIPIZIDE, JANUVIA morning of your test.  You will be contacted by our office prior to your procedure for directions on holding your Plavix.  If you do not hear from our office 1 week prior to your scheduled procedure, please call [phone] to discuss.   If you are age 67 or older, your body mass index should be between 23-30. Your Body mass index is 30.09 kg/m. If this is out of the aforementioned range listed, please consider follow up with your Primary Care Provider.  If you are age 57 or younger, your body mass index should be between 19-25. Your Body mass index is 30.09 kg/m. If this is out of the aformentioned range listed, please consider follow up with your Primary Care Provider.

## 2017-12-29 NOTE — Telephone Encounter (Signed)
Patient has been advised that Dr Elmarie Shiley office has okayed him to hold plavix 5 days prior to his colonoscopy procedure. He verbalizes understanding of this.

## 2017-12-29 NOTE — Telephone Encounter (Signed)
   Primary Cardiologist: Mertie Moores, MD  Chart reviewed as part of pre-operative protocol coverage. Given past medical history and time since last visit, based on ACC/AHA guidelines, Alex Yang would be at acceptable risk for the planned procedure without further cardiovascular testing.   OK to hold plavix for 5 days for colonoscopy. Pt's last intervention was CABG in 2014. Resume plavix after colonoscopy.   I will route this recommendation to the requesting party via Epic fax function and remove from pre-op pool.  Please call with questions.  Tami Lin Jacqulene Huntley, PA 12/29/2017, 1:57 PM

## 2018-01-05 ENCOUNTER — Encounter: Payer: Self-pay | Admitting: Family Medicine

## 2018-01-21 ENCOUNTER — Encounter: Payer: Self-pay | Admitting: Internal Medicine

## 2018-01-28 ENCOUNTER — Other Ambulatory Visit: Payer: Self-pay

## 2018-01-28 ENCOUNTER — Ambulatory Visit (AMBULATORY_SURGERY_CENTER): Payer: Medicare HMO | Admitting: Internal Medicine

## 2018-01-28 ENCOUNTER — Encounter: Payer: Self-pay | Admitting: Internal Medicine

## 2018-01-28 VITALS — BP 110/67 | HR 60 | Temp 98.0°F | Resp 13 | Ht 74.0 in | Wt 234.0 lb

## 2018-01-28 DIAGNOSIS — D123 Benign neoplasm of transverse colon: Secondary | ICD-10-CM | POA: Diagnosis not present

## 2018-01-28 DIAGNOSIS — I2510 Atherosclerotic heart disease of native coronary artery without angina pectoris: Secondary | ICD-10-CM | POA: Diagnosis not present

## 2018-01-28 DIAGNOSIS — D122 Benign neoplasm of ascending colon: Secondary | ICD-10-CM | POA: Diagnosis not present

## 2018-01-28 DIAGNOSIS — R195 Other fecal abnormalities: Secondary | ICD-10-CM

## 2018-01-28 DIAGNOSIS — D120 Benign neoplasm of cecum: Secondary | ICD-10-CM | POA: Diagnosis not present

## 2018-01-28 DIAGNOSIS — I10 Essential (primary) hypertension: Secondary | ICD-10-CM | POA: Diagnosis not present

## 2018-01-28 DIAGNOSIS — D125 Benign neoplasm of sigmoid colon: Secondary | ICD-10-CM

## 2018-01-28 DIAGNOSIS — E669 Obesity, unspecified: Secondary | ICD-10-CM | POA: Diagnosis not present

## 2018-01-28 DIAGNOSIS — D128 Benign neoplasm of rectum: Secondary | ICD-10-CM

## 2018-01-28 DIAGNOSIS — K621 Rectal polyp: Secondary | ICD-10-CM | POA: Diagnosis not present

## 2018-01-28 HISTORY — PX: COLONOSCOPY W/ POLYPECTOMY: SHX1380

## 2018-01-28 MED ORDER — SODIUM CHLORIDE 0.9 % IV SOLN: 500.0000 mL | Freq: Once | INTRAVENOUS | Status: DC

## 2018-01-28 NOTE — Patient Instructions (Signed)
Impression/Recommendations:  Polyp handout given to patient. Diverticulosis handout given to patient. Hemorrhoid handout given to patient.  Resume previous diet. Continue present medications. Resume Plavix (clopidogrel) at prior dose tomorrow.  Repeat colonoscopy recommended for surveillance.  Date to be determined after pathology results reviewed..  YOU HAD AN ENDOSCOPIC PROCEDURE TODAY AT [facility]:   Refer to the procedure report that was given to you for any specific questions about what was found during the examination.  If the procedure report does not answer your questions, please call your gastroenterologist to clarify.  If you requested that your care partner not be given the details of your procedure findings, then the procedure report has been included in a sealed envelope for you to review at your convenience later.  YOU SHOULD EXPECT: Some feelings of bloating in the abdomen. Passage of more gas than usual.  Walking can help get rid of the air that was put into your GI tract during the procedure and reduce the bloating. If you had a lower endoscopy (such as a colonoscopy or flexible sigmoidoscopy) you may notice spotting of blood in your stool or on the toilet paper. If you underwent a bowel prep for your procedure, you may not have a normal bowel movement for a few days.  Please Note:  You might notice some irritation and congestion in your nose or some drainage.  This is from the oxygen used during your procedure.  There is no need for concern and it should clear up in a day or so.  SYMPTOMS TO REPORT IMMEDIATELY:   Following lower endoscopy (colonoscopy or flexible sigmoidoscopy):  Excessive amounts of blood in the stool  Significant tenderness or worsening of abdominal pains  Swelling of the abdomen that is new, acute  Fever of 100F or higher  For urgent or emergent issues, a gastroenterologist can be reached at any hour by calling [phone]  [phone].   DIET:  We do recommend a small meal at first, but then you may proceed to your regular diet.  Drink plenty of fluids but you should avoid alcoholic beverages for 24 hours.  ACTIVITY:  You should plan to take it easy for the rest of today and you should NOT DRIVE or use heavy machinery until tomorrow (because of the sedation medicines used during the test).    FOLLOW UP: Our staff will call the number listed on your records the next business day following your procedure to check on you and address any questions or concerns that you may have regarding the information given to you following your procedure. If we do not reach you, we will leave a message.  However, if you are feeling well and you are not experiencing any problems, there is no need to return our call.  We will assume that you have returned to your regular daily activities without incident.  If any biopsies were taken you will be contacted by phone or by letter within the next 1-3 weeks.  Please call us at [phone] if you have not heard about the biopsies in 3 weeks.    SIGNATURES/CONFIDENTIALITY: You and/or your care partner have signed paperwork which will be entered into your electronic medical record.  These signatures attest to the fact that that the information above on your After Visit Summary has been reviewed and is understood.  Full responsibility of the confidentiality of this discharge information lies with you and/or your care-partner.

## 2018-01-28 NOTE — Progress Notes (Signed)
Called to room to assist during endoscopic procedure.  Patient ID and intended procedure confirmed with present staff. Received instructions for my participation in the procedure from the performing physician.  

## 2018-01-28 NOTE — Progress Notes (Signed)
Report given to PACU, vss 

## 2018-01-28 NOTE — Op Note (Signed)
[facility] Patient Name: Alex Yang Procedure Date: 01/28/2018 2:12 PM MRN: [id number] Endoscopist: Jerene Bears , MD Age: 62 Referring MD:  Date of Birth: Jul 03, 1956 Gender: Male Account #: [id number] Procedure:                Colonoscopy Indications:              Positive Cologuard test Medicines:                Monitored Anesthesia Care Procedure:                Pre-Anesthesia Assessment:                           - Prior to the procedure, a History and Physical                            was performed, and patient medications and                            allergies were reviewed. The patient's tolerance of                            previous anesthesia was also reviewed. The risks                            and benefits of the procedure and the sedation                            options and risks were discussed with the patient.                            All questions were answered, and informed consent                            was obtained. Prior Anticoagulants: The patient has                            taken Plavix (clopidogrel), last dose was 5 days                            prior to procedure. ASA Grade Assessment: III - A                            patient with severe systemic disease. After                            reviewing the risks and benefits, the patient was                            deemed in satisfactory condition to undergo the                            procedure.  After obtaining informed consent, the colonoscope                            was passed under direct vision. Throughout the                            procedure, the patient's blood pressure, pulse, and                            oxygen saturations were monitored continuously. The                            Colonoscope was introduced through the anus and                            advanced to the cecum, identified by appendiceal   orifice and ileocecal valve. The colonoscopy was                            performed without difficulty. The patient tolerated                            the procedure well. The quality of the bowel                            preparation was good. The ileocecal valve,                            appendiceal orifice, and rectum were photographed. Scope In: 2:24:09 PM Scope Out: 2:49:42 PM Scope Withdrawal Time: 0 hours 22 minutes 58 seconds  Total Procedure Duration: 0 hours 25 minutes 33 seconds  Findings:                 The digital rectal exam was normal.                           A 6 mm polyp was found in the cecum. The polyp was                            sessile. The polyp was removed with a cold snare.                            Resection and retrieval were complete.                           Two sessile polyps were found in the ascending                            colon. The polyps were 4 to 7 mm in size. These                            polyps were removed with a cold snare. Resection  and retrieval were complete.                           Six sessile polyps were found in the transverse                            colon. The polyps were 3 to 6 mm in size. These                            polyps were removed with a cold snare. Resection                            and retrieval were complete.                           A 20 mm polyp was found in the sigmoid colon. The                            polyp was pedunculated. The polyp was removed with                            a hot snare. Resection and retrieval were complete.                            To prevent bleeding after the polypectomy, one                            hemostatic clip was successfully placed (MR                            conditional). There was no bleeding during, or at                            the end, of the procedure.                           A 6 mm polyp was found in the rectum. The  polyp was                            semi-pedunculated. The polyp was removed with a                            cold snare. Resection and retrieval were complete.                           A few small-mouthed diverticula were found in the                            sigmoid colon and descending colon.                           Internal hemorrhoids were found during  retroflexion. The hemorrhoids were small. Complications:            No immediate complications. Estimated Blood Loss:     Estimated blood loss was minimal. Impression:               - One 6 mm polyp in the cecum, removed with a cold                            snare. Resected and retrieved.                           - Two 4 to 7 mm polyps in the ascending colon,                            removed with a cold snare. Resected and retrieved.                           - Six 3 to 6 mm polyps in the transverse colon,                            removed with a cold snare. Resected and retrieved.                           - One 20 mm polyp in the sigmoid colon, removed                            with a hot snare. Resected and retrieved. Clip (MR                            conditional) was placed.                           - One 6 mm polyp in the rectum, removed with a cold                            snare. Resected and retrieved.                           - Mild diverticulosis in the sigmoid colon and in                            the descending colon.                           - Small internal hemorrhoids. Recommendation:           - Patient has a contact number available for                            emergencies. The signs and symptoms of potential                            delayed complications were discussed with the  patient. Return to normal activities tomorrow.                            Written discharge instructions were provided to the                            patient.                            - Resume previous diet.                           - Continue present medications.                           - Resume Plavix (clopidogrel) at prior dose                            tomorrow. Refer to managing physician for further                            adjustment of therapy.                           - Await pathology results.                           - Repeat colonoscopy is recommended for                            surveillance. The colonoscopy date will be                            determined after pathology results from today's                            exam become available for review. Jerene Bears, MD 01/28/2018 2:56:16 PM This report has been signed electronically.

## 2018-01-29 ENCOUNTER — Telehealth: Payer: Self-pay

## 2018-01-29 ENCOUNTER — Encounter: Payer: Self-pay | Admitting: Family Medicine

## 2018-01-29 NOTE — Telephone Encounter (Signed)
Voicemail full. Unable to leave message

## 2018-01-29 NOTE — Telephone Encounter (Signed)
  Follow up Call-  Call back number 01/28/2018  Post procedure Call Back phone  # [phone]  Permission to leave phone message Yes  Some recent data might be hidden     Patient questions:  Do you have a fever, pain , or abdominal swelling? No. Pain Score  0 *  Have you tolerated food without any problems? Yes.    Have you been able to return to your normal activities? Yes.    Do you have any questions about your discharge instructions: Diet   No. Medications  No. Follow up visit  No.  Do you have questions or concerns about your Care? No.  Actions: * If pain score is 4 or above: No action needed, pain <4.

## 2018-02-02 ENCOUNTER — Encounter: Payer: Self-pay | Admitting: Internal Medicine

## 2018-02-07 ENCOUNTER — Encounter: Payer: Self-pay | Admitting: Family Medicine

## 2018-02-15 DIAGNOSIS — R972 Elevated prostate specific antigen [PSA]: Secondary | ICD-10-CM

## 2018-02-15 HISTORY — DX: Elevated prostate specific antigen (PSA): R97.20

## 2018-03-10 ENCOUNTER — Ambulatory Visit (INDEPENDENT_AMBULATORY_CARE_PROVIDER_SITE_OTHER): Payer: Medicare HMO | Admitting: Family Medicine

## 2018-03-10 ENCOUNTER — Encounter: Payer: Self-pay | Admitting: Family Medicine

## 2018-03-10 VITALS — BP 126/78 | HR 54 | Temp 98.2°F | Resp 16 | Ht 72.0 in | Wt 228.5 lb

## 2018-03-10 DIAGNOSIS — Z0000 Encounter for general adult medical examination without abnormal findings: Secondary | ICD-10-CM

## 2018-03-10 DIAGNOSIS — E118 Type 2 diabetes mellitus with unspecified complications: Secondary | ICD-10-CM | POA: Diagnosis not present

## 2018-03-10 DIAGNOSIS — I10 Essential (primary) hypertension: Secondary | ICD-10-CM | POA: Diagnosis not present

## 2018-03-10 DIAGNOSIS — E7800 Pure hypercholesterolemia, unspecified: Secondary | ICD-10-CM | POA: Diagnosis not present

## 2018-03-10 DIAGNOSIS — E162 Hypoglycemia, unspecified: Secondary | ICD-10-CM

## 2018-03-10 DIAGNOSIS — E669 Obesity, unspecified: Secondary | ICD-10-CM

## 2018-03-10 DIAGNOSIS — Z125 Encounter for screening for malignant neoplasm of prostate: Secondary | ICD-10-CM | POA: Diagnosis not present

## 2018-03-10 LAB — LIPID PANEL
Cholesterol: 100 mg/dL (ref 0–200)
HDL: 47.5 mg/dL (ref >39.00)
LDL Cholesterol: 27 mg/dL (ref 0–99)
NonHDL: 52.74
Total CHOL/HDL Ratio: 2
Triglycerides: 129 mg/dL (ref 0.0–149.0)
VLDL: 25.8 mg/dL (ref 0.0–40.0)

## 2018-03-10 LAB — COMPREHENSIVE METABOLIC PANEL
ALT: 18 U/L (ref 0–53)
AST: 14 U/L (ref 0–37)
Albumin: 4.3 g/dL (ref 3.5–5.2)
Alkaline Phosphatase: 63 U/L (ref 39–117)
BUN: 13 mg/dL (ref 6–23)
CO2: 30 mEq/L (ref 19–32)
Calcium: 9.6 mg/dL (ref 8.4–10.5)
Chloride: 103 mEq/L (ref 96–112)
Creatinine, Ser: 1.01 mg/dL (ref 0.40–1.50)
GFR: 79.13 mL/min (ref >60.00)
Glucose, Bld: 91 mg/dL (ref 70–99)
Potassium: 4.8 mEq/L (ref 3.5–5.1)
Sodium: 140 mEq/L (ref 135–145)
Total Bilirubin: 0.8 mg/dL (ref 0.2–1.2)
Total Protein: 7.1 g/dL (ref 6.0–8.3)

## 2018-03-10 LAB — CBC WITH DIFFERENTIAL/PLATELET
Basophils Absolute: 0.1 10*3/uL (ref 0.0–0.1)
Basophils Relative: 0.6 % (ref 0.0–3.0)
Eosinophils Absolute: 0.3 10*3/uL (ref 0.0–0.7)
Eosinophils Relative: 3.1 % (ref 0.0–5.0)
HCT: 47.3 % (ref 39.0–52.0)
Hemoglobin: 15.6 g/dL (ref 13.0–17.0)
Lymphocytes Relative: 24.7 % (ref 12.0–46.0)
Lymphs Abs: 2.4 10*3/uL (ref 0.7–4.0)
MCHC: 33 g/dL (ref 30.0–36.0)
MCV: 94.6 fl (ref 78.0–100.0)
Monocytes Absolute: 0.7 10*3/uL (ref 0.1–1.0)
Monocytes Relative: 7.2 % (ref 3.0–12.0)
Neutro Abs: 6.3 10*3/uL (ref 1.4–7.7)
Neutrophils Relative %: 64.4 % (ref 43.0–77.0)
Platelets: 270 10*3/uL (ref 150.0–400.0)
RBC: 5 Mil/uL (ref 4.22–5.81)
RDW: 13.8 % (ref 11.5–15.5)
WBC: 9.8 10*3/uL (ref 4.0–10.5)

## 2018-03-10 LAB — HEMOGLOBIN A1C: Hgb A1c MFr Bld: 6.7 % — ABNORMAL HIGH (ref 4.6–6.5)

## 2018-03-10 LAB — PSA: PSA: 2.77 ng/mL (ref 0.10–4.00)

## 2018-03-10 NOTE — Patient Instructions (Signed)
Stop your glipizide. If your morning sugars are still dropping to <90 then call or return to office.

## 2018-03-10 NOTE — Progress Notes (Signed)
Office Note 03/10/2018  CC:  Chief Complaint  Patient presents with  . Annual Exam    Pt is fasting.     HPI:  Alex Yang is a 62 y.o.  male who is who is here for annual health maintenance exam.  Last 2 wks the highest gluc has been 114--checks fasting gluc's only.  He consistently takes glipizide qAM, januvia qAM, metformin bid. Glucose was 83 this morning. Recalls gluc of 60 on two occasions, felt disoriented, wife gave OJ and he felt better.   Activity level/exercise: late at night and this has always been his routine. He does not have any burning or tingling in feet, but has intermittent numbness feeling only when he is eating.  Past Medical History:  Diagnosis Date  . Anxiety   . Chronic renal insufficiency, stage II (mild) 2014/2015   CrCl @ 60 ml/min  . Colon cancer screening 02/2017   Pt declined colonoscopy, but agreed to cologuard screening--this test was POSITIVE on 03/11/17---f/u colonoscopy 01/28/18 w/multiple polyps, path pending as of 01/29/18.  . Coronary artery disease    CABG 2014.  . Diabetes ([place]) 07/2013  . Diabetic peripheral neuropathy associated with type 2 diabetes mellitus ([place])   . Dupuytren's disease 09/2017   ortho 09/2017, L hand  . Erectile dysfunction    Dr. Tresa Moore (urol is also doing prostate ca screening).  Refractory to medications.  Pt likely to get penile implant as of urol f/u 01/2015.  Johney Maine hematuria 2017   Pt is to bring this up with his urologist as of 06/2016.  Marland Kitchen Hyperlipidemia   . Hypertension   . NSTEMI (non-ST elevated myocardial infarction) [facility]) 07/2013   CABG x 22 Jul 2013  . Obesity, Class I, BMI 30-34.9   . Paroxysmal atrial fibrillation ([place]) 07/2013   Started post-op CABG, converted on amiodarone--amiodarone stopped after a couple months  . Stenosing tenosynovitis of thumb 2019   left; incarcerated in extension (ortho visit 09/2017--steroid injection).    Past Surgical History:  Procedure Laterality Date  .  COLONOSCOPY W/ POLYPECTOMY  01/28/2018   Mild diverticulosis, small int hem.  Adenomatous polyp: recall 1 yr per GI  . CORONARY ARTERY BYPASS GRAFT N/A 08/02/2013   Procedure: CORONARY ARTERY BYPASS GRAFTING (CABG);  Surgeon: Grace Isaac, MD;  Location: [place];  Service: Open Heart Surgery;  Laterality: N/A;  Coronary artery bypass graft times five utilizing the left internal mammary artery and the right greater saphenous vein.  Marland Kitchen ENDOVEIN HARVEST OF GREATER SAPHENOUS VEIN Right 08/02/2013   Procedure: ENDOVEIN HARVEST OF GREATER SAPHENOUS VEIN;  Surgeon: Grace Isaac, MD;  Location: [place];  Service: Open Heart Surgery;  Laterality: Right;  . INTRAOPERATIVE TRANSESOPHAGEAL ECHOCARDIOGRAM N/A 08/02/2013   Procedure: INTRAOPERATIVE TRANSESOPHAGEAL ECHOCARDIOGRAM;  Surgeon: Grace Isaac, MD;  Location: [place];  Service: Open Heart Surgery;  Laterality: N/A;.  EF 40%, LVH and LV dilatation, inf wall hypokinesis (at the time of his MI)  . LEFT HEART CATHETERIZATION WITH CORONARY ANGIOGRAM N/A 08/01/2013   Procedure: LEFT HEART CATHETERIZATION WITH CORONARY ANGIOGRAM;  Surgeon: Blane Ohara, MD;  Location: [facility];  Service: Cardiovascular;  Laterality: N/A;  . NASAL FRACTURE SURGERY  1979  . SHOULDER SURGERY  1983   left; pins are in place  . TRANSTHORACIC ECHOCARDIOGRAM  07/2013   EF 40%+ wall motion abnl--in the context of acute NSTEMI    Family History  Problem Relation Age of Onset  . Heart attack Mother   .  Heart disease Mother   . Diabetes Mother   . Heart disease Father   . Cancer Brother        lung and liver  . Colon cancer Neg Hx   . Rectal cancer Neg Hx     Social History   Socioeconomic History  . Marital status: Married    Spouse name: Not on file  . Number of children: Not on file  . Years of education: Not on file  . Highest education level: Not on file  Occupational History  . Occupation: Environmental manager  . Financial resource  strain: Not on file  . Food insecurity:    Worry: Not on file    Inability: Not on file  . Transportation needs:    Medical: Not on file    Non-medical: Not on file  Tobacco Use  . Smoking status: Never Smoker  . Smokeless tobacco: Never Used  Substance and Sexual Activity  . Alcohol use: No    Alcohol/week: 0.0 oz  . Drug use: No  . Sexual activity: Not on file  Lifestyle  . Physical activity:    Days per week: Not on file    Minutes per session: Not on file  . Stress: Not on file  Relationships  . Social connections:    Talks on phone: Not on file    Gets together: Not on file    Attends religious service: Not on file    Active member of club or organization: Not on file    Attends meetings of clubs or organizations: Not on file    Relationship status: Not on file  . Intimate partner violence:    Fear of current or ex partner: Not on file    Emotionally abused: Not on file    Physically abused: Not on file    Forced sexual activity: Not on file  Other Topics Concern  . Not on file  Social History Narrative   Single, lives with fiance'.   Has 3 biologic children.   Occupation:  Theme park manager (self employed).   Education: HS   No tobacco, alc, or drugs   Starts cardiac rehab 10/06/13.    Outpatient Medications Prior to Visit  Medication Sig Dispense Refill  . ACCU-CHEK SOFTCLIX LANCETS lancets Use as instructed 100 each 12  . albuterol (PROVENTIL HFA;VENTOLIN HFA) 108 (90 Base) MCG/ACT inhaler Inhale 2 puffs into the lungs every 4 (four) hours as needed for wheezing or shortness of breath. 1 Inhaler 1  . Alcohol Swabs (B-D SINGLE USE SWABS REGULAR) PADS Use to check blood sugar three times daily 100 each 12  . aspirin EC 81 MG EC tablet Take 1 tablet (81 mg total) by mouth daily.    Marland Kitchen atorvastatin (LIPITOR) 20 MG tablet Take 1 tablet (20 mg total) by mouth daily at 6 PM. 90 tablet 3  . Blood Glucose Calibration (ACCU-CHEK AVIVA) SOLN 1 Bottle by In Vitro route as  needed. 1 each 0  . Blood Glucose Monitoring Suppl (ACCU-CHEK AVIVA PLUS) w/Device KIT 1 each by Does not apply route 3 (three) times daily. 1 kit 0  . citalopram (CELEXA) 20 MG tablet TAKE 1 TABLET (20 MG TOTAL) BY MOUTH DAILY. 90 tablet 3  . clopidogrel (PLAVIX) 75 MG tablet Take 1 tablet (75 mg total) by mouth daily with breakfast. 90 tablet 3  . gabapentin (NEURONTIN) 300 MG capsule Take 1 capsule (300 mg total) by mouth at bedtime. 90 capsule 3  . glucose  blood (ACCU-CHEK AVIVA PLUS) test strip Use as instructed 100 each 12  . glucose blood test strip Use as instructed 100 each 12  . LANCETS ULTRA THIN 30G MISC 1 Units by Does not apply route 4 (four) times daily. Breakfast, Lunch, Dinner, Bedtime 100 each 3  . LORazepam (ATIVAN) 0.5 MG tablet 1-2 tabs po q8h prn anxiety 30 tablet 5  . losartan (COZAAR) 50 MG tablet TAKE 1 TABLET BY MOUTH DAILY. 90 tablet 3  . metFORMIN (GLUCOPHAGE) 1000 MG tablet Take 1 tablet (1,000 mg total) by mouth 2 (two) times daily with a meal. 180 tablet 1  . metoprolol tartrate (LOPRESSOR) 50 MG tablet Take 1 tablet (50 mg total) by mouth 2 (two) times daily. 180 tablet 1  . sitaGLIPtin (JANUVIA) 100 MG tablet Take 1 tablet (100 mg total) by mouth daily. 90 tablet 1  . traZODone (DESYREL) 50 MG tablet TAKE 1 TO 2 TABLETS AT BEDTIME AS NEEDED FOR INSOMNIA 180 tablet 3  . glipiZIDE (GLUCOTROL XL) 10 MG 24 hr tablet Take 1 tablet (10 mg total) by mouth daily. 90 tablet 1  . 0.9 %  sodium chloride infusion      No facility-administered medications prior to visit.     Allergies  Allergen Reactions  . Oxycodone Other (See Comments)    Psych/mental adverse effects  . Pioglitazone Swelling and Rash    ROS Review of Systems  Constitutional: Negative for appetite change, chills, fatigue and fever.  HENT: Negative for congestion, dental problem, ear pain and sore throat.   Eyes: Negative for discharge, redness and visual disturbance.  Respiratory: Negative for  cough, chest tightness, shortness of breath and wheezing.   Cardiovascular: Negative for chest pain, palpitations and leg swelling.  Gastrointestinal: Negative for abdominal pain, blood in stool, diarrhea, nausea and vomiting.  Genitourinary: Negative for difficulty urinating, dysuria, flank pain, frequency, hematuria and urgency.  Musculoskeletal: Negative for arthralgias, back pain, joint swelling, myalgias and neck stiffness.  Skin: Negative for pallor and rash.  Neurological: Negative for dizziness, speech difficulty, weakness and headaches.  Hematological: Negative for adenopathy. Does not bruise/bleed easily.  Psychiatric/Behavioral: Negative for confusion and sleep disturbance. The patient is not nervous/anxious.     PE; Blood pressure 126/78, pulse (!) 54, temperature 98.2 F (36.8 C), temperature source Oral, resp. rate 16, height 6' (1.829 m), weight 228 lb 8 oz (103.6 kg), SpO2 96 %. Body mass index is 30.99 kg/m.  Gen: Alert, well appearing.  Patient is oriented to person, place, time, and situation. AFFECT: pleasant, lucid thought and speech. ENT: Ears: EACs clear, normal epithelium.  TMs with good light reflex and landmarks bilaterally.  Eyes: no injection, icteris, swelling, or exudate.  EOMI, PERRLA. Nose: no drainage or turbinate edema/swelling.  No injection or focal lesion.  Mouth: lips without lesion/swelling.  Oral mucosa pink and moist.  Dentition intact and without obvious caries or gingival swelling.  Oropharynx without erythema, exudate, or swelling.  Neck: supple/nontender.  No LAD, mass, or TM.  Carotid pulses 2+ bilaterally, without bruits. CV: RRR, no m/r/g.   LUNGS: CTA bilat, nonlabored resps, good aeration in all lung fields. ABD: soft, NT, ND, BS normal.  No hepatospenomegaly or mass.  No bruits. EXT: no clubbing, cyanosis, or edema.  Musculoskeletal: no joint swelling, erythema, warmth, or tenderness.  ROM of all joints intact. Skin - no sores or suspicious  lesions or rashes or color changes Foot exam - He has very mild contraction deformity of toes on both  feet. No swelling, tenderness or skin or vascular lesions. Color and temperature is normal.  Senstion diminished to monofilament testing on both plantar surfaces (sparing the great toe and 1st MTP joint region bilat).. Peripheral pulses are palpable. Toenails are normal.   Pertinent labs:  Lab Results  Component Value Date   TSH 2.42 02/27/2017   Lab Results  Component Value Date   WBC 10.6 (H) 02/27/2017   HGB 15.6 02/27/2017   HCT 46.8 02/27/2017   MCV 93.7 02/27/2017   PLT 260.0 02/27/2017   Lab Results  Component Value Date   CREATININE 1.09 12/14/2017   BUN 32 (H) 12/14/2017   NA 141 12/14/2017   K 4.4 12/14/2017   CL 103 12/14/2017   CO2 30 12/14/2017   Lab Results  Component Value Date   ALT 24 06/25/2017   AST 14 06/25/2017   ALKPHOS 64 06/25/2017   BILITOT 0.5 06/25/2017   Lab Results  Component Value Date   CHOL 101 06/25/2017   Lab Results  Component Value Date   HDL 47 06/25/2017   Lab Results  Component Value Date   LDLCALC 33 06/25/2017   Lab Results  Component Value Date   TRIG 106 06/25/2017   Lab Results  Component Value Date   CHOLHDL 2.1 06/25/2017   Lab Results  Component Value Date   PSA 1.71 02/27/2017   Lab Results  Component Value Date   HGBA1C 6.5 12/14/2017    ASSESSMENT AND PLAN:   1) DM 2, with recent hypoglycemia problems. Stop glipizide since this is his diabetic med most likely to lead to hypoglycemia. Instructions: Stop your glipizide. If your morning sugars are still dropping to <90 then call or return to office. Feet exam today showed slightly diminished sensation to monofilament testing on plantar surfaces bilat. A1c today. He has diab retpthy screening exam scheduled.  2)Health maintenance exam: Reviewed age and gender appropriate health maintenance issues (prudent diet, regular exercise, health risks of  tobacco and excessive alcohol, use of seatbelts, fire alarms in home, use of sunscreen).  Also reviewed age and gender appropriate health screening as well as vaccine recommendations. Vaccines: UTD, including shingrix. Labs: FLP, CMET, CBC w/diff, HbA1c, PSA. Prostate ca screening: DRE normal today, PSA. Colon ca screening: Hx of adenomatous polyps.  Next colonoscopy due 01/2019.  An After Visit Summary was printed and given to the patient.  FOLLOW UP:  Return in about 3 months (around 06/10/2018) for routine chronic illness f/u.  Signed:  Crissie Sickles, MD           03/10/2018

## 2018-04-08 ENCOUNTER — Other Ambulatory Visit: Payer: Self-pay | Admitting: Family Medicine

## 2018-04-12 ENCOUNTER — Other Ambulatory Visit: Payer: Self-pay

## 2018-04-12 MED ORDER — LORAZEPAM 0.5 MG PO TABS: ORAL_TABLET | ORAL | 5 refills | Status: DC

## 2018-04-13 ENCOUNTER — Other Ambulatory Visit: Payer: Self-pay | Admitting: Family Medicine

## 2018-04-20 ENCOUNTER — Telehealth: Payer: Self-pay | Admitting: Family Medicine

## 2018-04-20 MED ORDER — METOPROLOL TARTRATE 50 MG PO TABS: 50.0000 mg | ORAL_TABLET | Freq: Two times a day (BID) | ORAL | 1 refills | Status: DC

## 2018-04-20 NOTE — Telephone Encounter (Signed)
metoprolol refill Last refill2/13 19 #180 1 [facility]ill [facility] L[facility]19 [facility]  Refilled per protocol

## 2018-04-20 NOTE — Telephone Encounter (Signed)
Copied from [place] [id number]. Topic: Quick Communication - See Telephone Encounter >> Apr 20, 2018  2:45 PM Conception Chancy, NT wrote: CRM for notification. See Telephone encounter for: 04/20/18.  Patient is calling and requesting a refill on metoprolol tartrate (LOPRESSOR) 50 MG tablet. Patient is completely out.   [place], [place] [address] Phone: [phone] Fax: [phone]

## 2018-04-26 ENCOUNTER — Ambulatory Visit: Payer: Medicare HMO | Admitting: Family Medicine

## 2018-04-28 ENCOUNTER — Ambulatory Visit (HOSPITAL_BASED_OUTPATIENT_CLINIC_OR_DEPARTMENT_OTHER)
Admission: RE | Admit: 2018-04-28 | Discharge: 2018-04-28 | Disposition: A | Discharge status: Home / Self Care | Payer: Medicare HMO | Source: Ambulatory Visit | Attending: Family Medicine | Admitting: Family Medicine

## 2018-04-28 ENCOUNTER — Ambulatory Visit (INDEPENDENT_AMBULATORY_CARE_PROVIDER_SITE_OTHER): Payer: Medicare HMO | Admitting: Family Medicine

## 2018-04-28 ENCOUNTER — Encounter: Payer: Self-pay | Admitting: Family Medicine

## 2018-04-28 VITALS — BP 134/76 | HR 75 | Temp 97.6°F | Resp 16 | Ht 72.0 in | Wt 228.5 lb

## 2018-04-28 DIAGNOSIS — S8001XA Contusion of right knee, initial encounter: Secondary | ICD-10-CM

## 2018-04-28 DIAGNOSIS — S8011XA Contusion of right lower leg, initial encounter: Secondary | ICD-10-CM | POA: Insufficient documentation

## 2018-04-28 DIAGNOSIS — M79604 Pain in right leg: Secondary | ICD-10-CM | POA: Diagnosis not present

## 2018-04-28 DIAGNOSIS — Z23 Encounter for immunization: Secondary | ICD-10-CM | POA: Diagnosis not present

## 2018-04-28 DIAGNOSIS — X58XXXA Exposure to other specified factors, initial encounter: Secondary | ICD-10-CM | POA: Insufficient documentation

## 2018-04-28 LAB — CBC
HCT: 40.6 % (ref 39.0–52.0)
Hemoglobin: 13.5 g/dL (ref 13.0–17.0)
MCHC: 33.4 g/dL (ref 30.0–36.0)
MCV: 91.8 fl (ref 78.0–100.0)
Platelets: 292 10*3/uL (ref 150.0–400.0)
RBC: 4.42 Mil/uL (ref 4.22–5.81)
RDW: 13.6 % (ref 11.5–15.5)
WBC: 9.7 10*3/uL (ref 4.0–10.5)

## 2018-04-28 MED ORDER — HYDROCODONE-ACETAMINOPHEN 7.5-300 MG PO TABS: ORAL_TABLET | ORAL | 0 refills | Status: DC
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
## 2018-04-28 NOTE — Patient Instructions (Signed)
Stop your plavix and aspirin x 5d.

## 2018-04-30 ENCOUNTER — Telehealth: Payer: Self-pay | Admitting: Family Medicine

## 2018-04-30 NOTE — Telephone Encounter (Signed)
Please advise. Thanks.  

## 2018-04-30 NOTE — Telephone Encounter (Signed)
Copied from [place] [id number]. Topic: Quick Communication - See Telephone Encounter >> Apr 30, 2018  1:33 PM Vernona Rieger wrote: CRM for notification. See Telephone encounter for: 04/30/18.  Patient would like Dr Anitra Lauth to know that [facility] is out of stock for HYDROcodone-Acetaminophen 7.5-300 MG TABS. They will not have it back in until Monday evening. Can that be sent to [facility] in [place]?

## 2018-05-01 NOTE — Telephone Encounter (Signed)
A user error has taken place: encounter opened in error, closed for administrative reasons.

## 2018-05-03 ENCOUNTER — Ambulatory Visit (INDEPENDENT_AMBULATORY_CARE_PROVIDER_SITE_OTHER): Payer: Medicare HMO | Admitting: Family Medicine

## 2018-05-03 ENCOUNTER — Ambulatory Visit (HOSPITAL_BASED_OUTPATIENT_CLINIC_OR_DEPARTMENT_OTHER)
Admission: RE | Admit: 2018-05-03 | Discharge: 2018-05-03 | Disposition: A | Discharge status: Home / Self Care | Payer: Medicare HMO | Source: Ambulatory Visit | Attending: Family Medicine | Admitting: Family Medicine

## 2018-05-03 ENCOUNTER — Encounter: Payer: Self-pay | Admitting: Family Medicine

## 2018-05-03 ENCOUNTER — Telehealth: Payer: Self-pay | Admitting: Family Medicine

## 2018-05-03 VITALS — BP 133/81 | HR 68 | Temp 98.1°F | Resp 16 | Ht 72.0 in | Wt 231.5 lb

## 2018-05-03 DIAGNOSIS — S9031XD Contusion of right foot, subsequent encounter: Secondary | ICD-10-CM

## 2018-05-03 DIAGNOSIS — M79671 Pain in right foot: Secondary | ICD-10-CM | POA: Insufficient documentation

## 2018-05-03 DIAGNOSIS — M7989 Other specified soft tissue disorders: Secondary | ICD-10-CM | POA: Insufficient documentation

## 2018-05-03 DIAGNOSIS — S99921A Unspecified injury of right foot, initial encounter: Secondary | ICD-10-CM | POA: Diagnosis not present

## 2018-05-03 MED ORDER — HYDROCODONE-ACETAMINOPHEN 7.5-325 MG PO TABS: 1.0000 | ORAL_TABLET | Freq: Four times a day (QID) | ORAL | 0 refills | Status: DC | PRN

## 2018-05-03 NOTE — Telephone Encounter (Signed)
Contacted patient's pharmacy.  Tech states that she believes if we send in Hydrocodone-Acetaminophen 7.5 - 325 the insurance won't require PA>   Please advise.

## 2018-05-03 NOTE — Telephone Encounter (Signed)
Copied from [place] [id number]. Topic: Quick Communication - Rx Refill/Question >> May 03, 2018  2:38 PM Tye [place] wrote: Medication: HYDROcodone-Acetaminophen 7.5-300 MG TABS [[id number]]   [facility] called to get a PA started for the medication above; contact [phone]

## 2018-05-03 NOTE — Telephone Encounter (Signed)
Contacted pharmacy, RX went through. Patient's wife advised.

## 2018-05-03 NOTE — Progress Notes (Signed)
OFFICE VISIT  05/03/2018   CC:  Chief Complaint  Patient presents with  . Follow-up    right knee   HPI:    Patient is a 62 y.o. Caucasian male who presents for 5 day f/u right tibial contusion. Plain films of R knee, tibia/fibula, and R ankle were all neg for any abnormality last visit. Started him on vicodin 7.5/300, cut back on his ibuprofen. Briefly held his plavix and ASA, but since x-rays were good and blood counts were normal I got him back on these meds.  He is largely unchanged. Probs getting pain pills at pharmacy---is set to get these today. Pain in all the same areas but remarks more about pain on top of R foot more than last visit. Has used crutches some. Reviewed results of all recent x-rays.  Past Medical History:  Diagnosis Date  . Anxiety   . Chronic renal insufficiency, stage II (mild) 2014/2015   CrCl @ 60 ml/min  . Colon cancer screening 02/2017   Pt declined colonoscopy, but agreed to cologuard screening--this test was POSITIVE on 03/11/17---f/u colonoscopy 01/28/18 w/multiple polyps, path pending as of 01/29/18.  . Coronary artery disease    CABG 2014.  . Diabetes ([place]) 07/2013  . Diabetic peripheral neuropathy associated with type 2 diabetes mellitus ([place])   . Dupuytren's disease 09/2017   ortho 09/2017, L hand  . Erectile dysfunction    Dr. Tresa Moore (urol is also doing prostate ca screening).  Refractory to medications.  Pt likely to get penile implant as of urol f/u 01/2015.  Johney Maine hematuria 2017   Pt is to bring this up with his urologist as of 06/2016.  Marland Kitchen Hyperlipidemia   . Hypertension   . Increased prostate specific antigen (PSA) velocity 02/2018   PSA 1.7 to 2.7 from 02/2017 to 02/2018.  Repeat PSA approx Jan/feb 2020.  Marland Kitchen NSTEMI (non-ST elevated myocardial infarction) [facility]) 07/2013   CABG x 22 Jul 2013  . Obesity, Class I, BMI 30-34.9   . Paroxysmal atrial fibrillation ([place]) 07/2013   Started post-op CABG, converted on amiodarone--amiodarone  stopped after a couple months  . Stenosing tenosynovitis of thumb 2019   left; incarcerated in extension (ortho visit 09/2017--steroid injection).    Past Surgical History:  Procedure Laterality Date  . COLONOSCOPY W/ POLYPECTOMY  01/28/2018   Mild diverticulosis, small int hem.  Adenomatous polyp: recall 1 yr per GI  . CORONARY ARTERY BYPASS GRAFT N/A 08/02/2013   Procedure: CORONARY ARTERY BYPASS GRAFTING (CABG);  Surgeon: Grace Isaac, MD;  Location: [place];  Service: Open Heart Surgery;  Laterality: N/A;  Coronary artery bypass graft times five utilizing the left internal mammary artery and the right greater saphenous vein.  Marland Kitchen ENDOVEIN HARVEST OF GREATER SAPHENOUS VEIN Right 08/02/2013   Procedure: ENDOVEIN HARVEST OF GREATER SAPHENOUS VEIN;  Surgeon: Grace Isaac, MD;  Location: [place];  Service: Open Heart Surgery;  Laterality: Right;  . INTRAOPERATIVE TRANSESOPHAGEAL ECHOCARDIOGRAM N/A 08/02/2013   Procedure: INTRAOPERATIVE TRANSESOPHAGEAL ECHOCARDIOGRAM;  Surgeon: Grace Isaac, MD;  Location: [place];  Service: Open Heart Surgery;  Laterality: N/A;.  EF 40%, LVH and LV dilatation, inf wall hypokinesis (at the time of his MI)  . LEFT HEART CATHETERIZATION WITH CORONARY ANGIOGRAM N/A 08/01/2013   Procedure: LEFT HEART CATHETERIZATION WITH CORONARY ANGIOGRAM;  Surgeon: Blane Ohara, MD;  Location: [facility] CATH LAB;  Service: Cardiovascular;  Laterality: N/A;  . NASAL FRACTURE SURGERY  1979  . [place]  left; pins are in place  . TRANSTHORACIC ECHOCARDIOGRAM  07/2013   EF 40%+ wall motion abnl--in the context of acute NSTEMI    Outpatient Medications Prior to Visit  Medication Sig Dispense Refill  . ACCU-CHEK SOFTCLIX LANCETS lancets Use as instructed 100 each 12  . albuterol (PROVENTIL HFA;VENTOLIN HFA) 108 (90 Base) MCG/ACT inhaler Inhale 2 puffs into the lungs every 4 (four) hours as needed for wheezing or shortness of breath. 1 Inhaler 1  . Alcohol Swabs  (B-D SINGLE USE SWABS REGULAR) PADS Use to check blood sugar three times daily 100 each 12  . aspirin EC 81 MG EC tablet Take 1 tablet (81 mg total) by mouth daily.    Marland Kitchen atorvastatin (LIPITOR) 20 MG tablet Take 1 tablet (20 mg total) by mouth daily at 6 PM. 90 tablet 3  . Blood Glucose Calibration (ACCU-CHEK AVIVA) SOLN 1 Bottle by In Vitro route as needed. 1 each 0  . citalopram (CELEXA) 20 MG tablet TAKE 1 TABLET (20 MG TOTAL) BY MOUTH DAILY. 90 tablet 3  . clopidogrel (PLAVIX) 75 MG tablet Take 1 tablet (75 mg total) by mouth daily with breakfast. 90 tablet 3  . gabapentin (NEURONTIN) 300 MG capsule Take 1 capsule (300 mg total) by mouth at bedtime. 90 capsule 3  . glucose blood (ACCU-CHEK AVIVA PLUS) test strip Use as instructed 100 each 12  . glucose blood test strip Use as instructed 100 each 12  . JANUVIA 100 MG tablet TAKE 1 TABLET EVERY DAY 90 tablet 1  . LANCETS ULTRA THIN 30G MISC 1 Units by Does not apply route 4 (four) times daily. Breakfast, Lunch, Dinner, Bedtime 100 each 3  . LORazepam (ATIVAN) 0.5 MG tablet 1-2 tabs po q8h prn anxiety 30 tablet 5  . losartan (COZAAR) 50 MG tablet TAKE 1 TABLET BY MOUTH DAILY. 90 tablet 3  . metFORMIN (GLUCOPHAGE) 1000 MG tablet Take 1 tablet (1,000 mg total) by mouth 2 (two) times daily with a meal. 180 tablet 1  . metoprolol tartrate (LOPRESSOR) 50 MG tablet Take 1 tablet (50 mg total) by mouth 2 (two) times daily. 180 tablet 1  . traZODone (DESYREL) 50 MG tablet TAKE 1 TO 2 TABLETS AT BEDTIME AS NEEDED FOR INSOMNIA 180 tablet 3  . Blood Glucose Monitoring Suppl (ACCU-CHEK AVIVA PLUS) w/Device KIT 1 each by Does not apply route 3 (three) times daily. (Patient not taking: Reported on 05/03/2018) 1 kit 0  . HYDROcodone-Acetaminophen 7.5-300 MG TABS 1-2 tabs po tid prn pain (Patient not taking: Reported on 05/03/2018) 180 each 0   No facility-administered medications prior to visit.     Allergies  Allergen Reactions  . Oxycodone Other (See  Comments)    Psych/mental adverse effects  . Pioglitazone Swelling and Rash    ROS As per HPI  PE: Blood pressure 133/81, pulse 68, temperature 98.1 F (36.7 C), temperature source Oral, resp. rate 16, height 6' (1.829 m), weight 231 lb 8 oz (105 kg), SpO2 97 %. Gen: Alert, well appearing.  Patient is oriented to person, place, time, and situation. AFFECT: pleasant, lucid thought and speech. Diffuse soft tissue swelling from level of inferior aspect of patella down into top of R foot, with more focal, fluctuant swelling in medial aspect of prox tibia region.  Tender to palpation from knee down into top of foot.  No plantar foot tenderness.  Rom of knees, ankles, and toes intact.  Bruising noted throughout R LL.  NV intact.  Feet warm.  Cap refill 1 sec/.  LABS:    Chemistry      Component Value Date/Time   NA 140 03/10/2018 0935   NA 137 06/25/2017 1644   K 4.8 03/10/2018 0935   CL 103 03/10/2018 0935   CO2 30 03/10/2018 0935   BUN 13 03/10/2018 0935   BUN 13 06/25/2017 1644   CREATININE 1.01 03/10/2018 0935      Component Value Date/Time   CALCIUM 9.6 03/10/2018 0935   ALKPHOS 63 03/10/2018 0935   AST 14 03/10/2018 0935   ALT 18 03/10/2018 0935   BILITOT 0.8 03/10/2018 0935   BILITOT 0.5 06/25/2017 1644     Lab Results  Component Value Date   HGBA1C 6.7 (H) 03/10/2018    IMPRESSION AND PLAN:  Contusion R knee, tibia, ankle, dorsum of foot. X-rays done last visit did not include enough of prox metatarsals to r/o fracture. Will obtain R foot x-ray today. Rx for walking boot given today to pt, continue crutches prn. Continue ice.  Vicodin 7.5/300 to be picked up today for pain control.  An After Visit Summary was printed and given to the patient.  FOLLOW UP: Return in about 3 weeks (around 05/24/2018) for f/u R leg.Has appt 06/16/18  Signed:  Crissie Sickles, MD           05/03/2018

## 2018-05-03 NOTE — Telephone Encounter (Signed)
OK, vicodin 7.5/325 eRx'd.

## 2018-05-05 NOTE — Telephone Encounter (Signed)
Copied from [place] [id number]. Topic: General - Other >> May 05, 2018 12:46 PM Bea Graff, NT wrote: Reason for CRM: Pt calling and states he has went to 7 different places to try and get the boot for his foot. He states most places do not take [employer] or they don't have the boot. He tried calling [employer] to see where he could try and no one came back to the phone after being on hold for an extended period of time. Pt requesting to speak with Dr. Idelle Leech nurse. >> May 05, 2018  4:47 PM Onalee Hua, CMA wrote: SW Dr. Anitra Lauth, there are no other alternatives, pt will need to pay out of pocket.  Pt advised that he will need to pay out of pocket for the boot since the medical supply store does not accept [employer]. Pt asked if he really needed the boot, I advised him he did. Pt asked if he could go to ER to get a boot, I advised him that that would most likely cost him more than just paying for the boot. Pt voiced understanding.

## 2018-05-24 ENCOUNTER — Ambulatory Visit: Payer: Medicare HMO | Admitting: Family Medicine

## 2018-05-28 ENCOUNTER — Other Ambulatory Visit: Payer: Self-pay | Admitting: Family Medicine

## 2018-06-01 ENCOUNTER — Encounter: Payer: Self-pay | Admitting: Family Medicine

## 2018-06-01 ENCOUNTER — Ambulatory Visit (INDEPENDENT_AMBULATORY_CARE_PROVIDER_SITE_OTHER): Payer: Medicare HMO | Admitting: Family Medicine

## 2018-06-01 VITALS — BP 126/78 | HR 67 | Temp 97.9°F | Resp 16 | Ht 72.0 in | Wt 226.1 lb

## 2018-06-01 DIAGNOSIS — S8011XD Contusion of right lower leg, subsequent encounter: Secondary | ICD-10-CM

## 2018-06-01 NOTE — Progress Notes (Signed)
OFFICE VISIT  06/01/2018   CC:  Chief Complaint  Patient presents with  . Follow-up    Right leg    HPI:    Patient is a 62 y.o. Caucasian male who presents for 1 mo f/u contusion R knee, tibia, ankle, dorsum of foot. All xray imaging has been neg for bony injury.  Vicodin, walking boot, crutches were discussed/recommended.  Interim hx: Feeling much improved. Swelling gradually dissipating, pain much less.   He took only a few of the vicodin.  He finally got a walking boot but doesn't have to wear it anymore.   Past Medical History:  Diagnosis Date  . Anxiety   . Chronic renal insufficiency, stage II (mild) 2014/2015   CrCl @ 60 ml/min  . Colon cancer screening 02/2017   Pt declined colonoscopy, but agreed to cologuard screening--this test was POSITIVE on 03/11/17---f/u colonoscopy 01/28/18 w/multiple polyps, path pending as of 01/29/18.  . Coronary artery disease    CABG 2014.  . Diabetes ([place]) 07/2013  . Diabetic peripheral neuropathy associated with type 2 diabetes mellitus ([place])   . Dupuytren's disease 09/2017   ortho 09/2017, L hand  . Erectile dysfunction    Dr. Tresa Moore (urol is also doing prostate ca screening).  Refractory to medications.  Pt likely to get penile implant as of urol f/u 01/2015.  Johney Maine hematuria 2017   Pt is to bring this up with his urologist as of 06/2016.  Marland Kitchen Hyperlipidemia   . Hypertension   . Increased prostate specific antigen (PSA) velocity 02/2018   PSA 1.7 to 2.7 from 02/2017 to 02/2018.  Repeat PSA approx Jan/feb 2020.  Marland Kitchen NSTEMI (non-ST elevated myocardial infarction) [facility]) 07/2013   CABG x 22 Jul 2013  . Obesity, Class I, BMI 30-34.9   . Paroxysmal atrial fibrillation ([place]) 07/2013   Started post-op CABG, converted on amiodarone--amiodarone stopped after a couple months  . Stenosing tenosynovitis of thumb 2019   left; incarcerated in extension (ortho visit 09/2017--steroid injection).    Past Surgical History:  Procedure Laterality Date   . COLONOSCOPY W/ POLYPECTOMY  01/28/2018   Mild diverticulosis, small int hem.  Adenomatous polyp: recall 1 yr per GI  . CORONARY ARTERY BYPASS GRAFT N/A 08/02/2013   Procedure: CORONARY ARTERY BYPASS GRAFTING (CABG);  Surgeon: Grace Isaac, MD;  Location: [place];  Service: Open Heart Surgery;  Laterality: N/A;  Coronary artery bypass graft times five utilizing the left internal mammary artery and the right greater saphenous vein.  Marland Kitchen ENDOVEIN HARVEST OF GREATER SAPHENOUS VEIN Right 08/02/2013   Procedure: ENDOVEIN HARVEST OF GREATER SAPHENOUS VEIN;  Surgeon: Grace Isaac, MD;  Location: [place];  Service: Open Heart Surgery;  Laterality: Right;  . INTRAOPERATIVE TRANSESOPHAGEAL ECHOCARDIOGRAM N/A 08/02/2013   Procedure: INTRAOPERATIVE TRANSESOPHAGEAL ECHOCARDIOGRAM;  Surgeon: Grace Isaac, MD;  Location: [place];  Service: Open Heart Surgery;  Laterality: N/A;.  EF 40%, LVH and LV dilatation, inf wall hypokinesis (at the time of his MI)  . LEFT HEART CATHETERIZATION WITH CORONARY ANGIOGRAM N/A 08/01/2013   Procedure: LEFT HEART CATHETERIZATION WITH CORONARY ANGIOGRAM;  Surgeon: Blane Ohara, MD;  [facility] C[facility];  Service: Cardiovascular;  Laterality: N/A;  . NASAL FRACTURE SURGERY  1979  . SHOULDER SURGERY  1983   left; pins are in place  . TRANSTHORACIC ECHOCARDIOGRAM  07/2013   EF 40%+ wall motion abnl--in the context of acute NSTEMI    Outpatient Medications Prior to Visit  Medication Sig Dispense Refill  .  ACCU-CHEK SOFTCLIX LANCETS lancets Use as instructed 100 each 12  . albuterol (PROVENTIL HFA;VENTOLIN HFA) 108 (90 Base) MCG/ACT inhaler Inhale 2 puffs into the lungs every 4 (four) hours as needed for wheezing or shortness of breath. 1 Inhaler 1  . Alcohol Swabs (B-D SINGLE USE SWABS REGULAR) PADS Use to check blood sugar three times daily 100 each 12  . aspirin EC 81 MG EC tablet Take 1 tablet (81 mg total) by mouth daily.    Marland Kitchen atorvastatin (LIPITOR) 20 MG tablet  Take 1 tablet (20 mg total) by mouth daily at 6 PM. 90 tablet 3  . Blood Glucose Calibration (ACCU-CHEK AVIVA) SOLN 1 Bottle by In Vitro route as needed. 1 each 0  . citalopram (CELEXA) 20 MG tablet TAKE 1 TABLET (20 MG TOTAL) BY MOUTH DAILY. 90 tablet 3  . clopidogrel (PLAVIX) 75 MG tablet Take 1 tablet (75 mg total) by mouth daily with breakfast. 90 tablet 3  . gabapentin (NEURONTIN) 300 MG capsule Take 1 capsule (300 mg total) by mouth at bedtime. 90 capsule 3  . glucose blood (ACCU-CHEK AVIVA PLUS) test strip Use as instructed 100 each 12  . glucose blood test strip Use as instructed 100 each 12  . JANUVIA 100 MG tablet TAKE 1 TABLET EVERY DAY 90 tablet 1  . LANCETS ULTRA THIN 30G MISC 1 Units by Does not apply route 4 (four) times daily. Breakfast, Lunch, Dinner, Bedtime 100 each 3  . LORazepam (ATIVAN) 0.5 MG tablet 1-2 tabs po q8h prn anxiety 30 tablet 5  . losartan (COZAAR) 50 MG tablet TAKE 1 TABLET BY MOUTH DAILY. 90 tablet 3  . metFORMIN (GLUCOPHAGE) 1000 MG tablet TAKE 1 TABLET TWICE DAILY WITH MEALS 180 tablet 1  . metoprolol tartrate (LOPRESSOR) 50 MG tablet Take 1 tablet (50 mg total) by mouth 2 (two) times daily. 180 tablet 1  . traZODone (DESYREL) 50 MG tablet TAKE 1 TO 2 TABLETS AT BEDTIME AS NEEDED FOR INSOMNIA 180 tablet 3  . Blood Glucose Monitoring Suppl (ACCU-CHEK AVIVA PLUS) w/Device KIT 1 each by Does not apply route 3 (three) times daily. (Patient not taking: Reported on 05/03/2018) 1 kit 0  . HYDROcodone-acetaminophen (NORCO) 7.5-325 MG tablet Take 1-2 tablets by mouth every 6 (six) hours as needed for moderate pain. (Patient not taking: Reported on 06/01/2018) 30 tablet 0   No facility-administered medications prior to visit.     Allergies  Allergen Reactions  . Oxycodone Other (See Comments)    Psych/mental adverse effects  . Pioglitazone Swelling and Rash    ROS As per HPI  PE: Blood pressure 126/78, pulse 67, temperature 97.9 F (36.6 C), temperature  source Oral, resp. rate 16, height 6' (1.829 m), weight 226 lb 2 oz (102.6 kg), SpO2 99 %. Gen: Alert, well appearing.  Patient is oriented to person, place, time, and situation. AFFECT: pleasant, lucid thought and speech. Right LL with diffuse soft tissue swelling but minimal, if any, pitting edema.  He has a warm, pink hematoma at medial aspect of prox tibial level, mild fluctuance of the hematoma.  No tenderness or streaking.  ROM of R knee and ankle intact.  LABS:    Chemistry      Component Value Date/Time   NA 140 03/10/2018 0935   NA 137 06/25/2017 1644   K 4.8 03/10/2018 0935   CL 103 03/10/2018 0935   CO2 30 03/10/2018 0935   BUN 13 03/10/2018 0935   BUN 13 06/25/2017 1644  CREATININE 1.01 03/10/2018 0935      Component Value Date/Time   CALCIUM 9.6 03/10/2018 0935   ALKPHOS 63 03/10/2018 0935   AST 14 03/10/2018 0935   ALT 18 03/10/2018 0935   BILITOT 0.8 03/10/2018 0935   BILITOT 0.5 06/25/2017 1644     Lab Results  Component Value Date   WBC 9.7 04/28/2018   HGB 13.5 04/28/2018   HCT 40.6 04/28/2018   MCV 91.8 04/28/2018   PLT 292.0 04/28/2018   Lab Results  Component Value Date   HGBA1C 6.7 (H) 03/10/2018    IMPRESSION AND PLAN:  Right lower leg contusion; all sx's resolving. Hematoma shrinking gradually, as expected. Reassured pt, nothing new to do at this time.  An After Visit Summary was printed and given to the patient.  FOLLOW UP: Return if symptoms worsen or fail to improve.  Signed:  Crissie Sickles, MD           06/01/2018

## 2018-06-16 ENCOUNTER — Ambulatory Visit: Payer: Medicare HMO | Admitting: Family Medicine

## 2018-06-23 ENCOUNTER — Encounter: Payer: Self-pay | Admitting: *Deleted

## 2018-06-23 ENCOUNTER — Encounter: Payer: Self-pay | Admitting: Family Medicine

## 2018-06-23 ENCOUNTER — Ambulatory Visit (INDEPENDENT_AMBULATORY_CARE_PROVIDER_SITE_OTHER): Payer: Medicare HMO | Admitting: Family Medicine

## 2018-06-23 VITALS — BP 125/81 | HR 65 | Temp 97.9°F | Resp 16 | Ht 72.0 in | Wt 227.4 lb

## 2018-06-23 DIAGNOSIS — Z79899 Other long term (current) drug therapy: Secondary | ICD-10-CM

## 2018-06-23 DIAGNOSIS — F411 Generalized anxiety disorder: Secondary | ICD-10-CM

## 2018-06-23 DIAGNOSIS — I10 Essential (primary) hypertension: Secondary | ICD-10-CM

## 2018-06-23 DIAGNOSIS — E118 Type 2 diabetes mellitus with unspecified complications: Secondary | ICD-10-CM

## 2018-06-23 DIAGNOSIS — E669 Obesity, unspecified: Secondary | ICD-10-CM | POA: Diagnosis not present

## 2018-06-23 DIAGNOSIS — E7800 Pure hypercholesterolemia, unspecified: Secondary | ICD-10-CM | POA: Diagnosis not present

## 2018-06-23 LAB — BASIC METABOLIC PANEL
BUN: 22 mg/dL (ref 6–23)
CO2: 29 mEq/L (ref 19–32)
Calcium: 9.7 mg/dL (ref 8.4–10.5)
Chloride: 104 mEq/L (ref 96–112)
Creatinine, Ser: 1.04 mg/dL (ref 0.40–1.50)
GFR: 76.43 mL/min (ref >60.00)
Glucose, Bld: 158 mg/dL — ABNORMAL HIGH (ref 70–99)
Potassium: 5.1 mEq/L (ref 3.5–5.1)
Sodium: 139 mEq/L (ref 135–145)

## 2018-06-23 LAB — HEMOGLOBIN A1C: Hgb A1c MFr Bld: 7.6 % — ABNORMAL HIGH (ref 4.6–6.5)

## 2018-06-23 NOTE — Progress Notes (Signed)
OFFICE VISIT  06/23/2018   CC:  Chief Complaint  Patient presents with  . Follow-up    RCI, pt is not fasting.    HPI:    Patient is a 62 y.o. Caucasian male who presents for 3 mo f/u DM2, HTN, HLD,and chronic anxiety.  DM: usually around 100-110, today was 145. No hypoglycemia. Has been riding a stationary bike quite a bit.  Also walking regularly.    HTN: home monitoring consistently <130/80.  HLD: taking statin daily, no problems.  Anxiety/insomnia: he takes ativan sometimes 1-2 times a day, other days none. No adverse side effects.  ROS: no CP, no SOB, no wheezing, no cough, no dizziness, no HAs, no rashes, no melena/hematochezia.  No polyuria or polydipsia.  No myalgias or arthralgias. His RLLL hematoma and ecchymoses are significantly improved,minimal discomfort in the area now.  Past Medical History:  Diagnosis Date  . Anxiety   . Chronic renal insufficiency, stage II (mild) 2014/2015   CrCl @ 60 ml/min  . Colon cancer screening 02/2017   Pt declined colonoscopy, but agreed to cologuard screening--this test was POSITIVE on 03/11/17---f/u colonoscopy 01/28/18 w/adenomatous polyps-->recall 1 yr.  . Coronary artery disease    CABG 2014.  . Diabetes ([place]) 07/2013  . Diabetic peripheral neuropathy associated with type 2 diabetes mellitus ([place])   . Dupuytren's disease 09/2017   ortho 09/2017, L hand  . Erectile dysfunction    Dr. Tresa Moore (urol is also doing prostate ca screening).  Refractory to medications.  Pt likely to get penile implant as of urol f/u 01/2015.  Johney Maine hematuria 2017   Pt is to bring this up with his urologist as of 06/2016.  Marland Kitchen Hyperlipidemia   . Hypertension   . Increased prostate specific antigen (PSA) velocity 02/2018   PSA 1.7 to 2.7 from 02/2017 to 02/2018.  Repeat PSA approx Jan/feb 2020.  Marland Kitchen NSTEMI (non-ST elevated myocardial infarction) [facility]) 07/2013   CABG x 22 Jul 2013  . Obesity, Class I, BMI 30-34.9   . Paroxysmal atrial fibrillation ([place])  07/2013   Started post-op CABG, converted on amiodarone--amiodarone stopped after a couple months  . Stenosing tenosynovitis of thumb 2019   left; incarcerated in extension (ortho visit 09/2017--steroid injection).    Past Surgical History:  Procedure Laterality Date  . COLONOSCOPY W/ POLYPECTOMY  01/28/2018   Mild diverticulosis, small int hem.  Adenomatous polyp: recall 1 yr per GI  . CORONARY ARTERY BYPASS GRAFT N/A 08/02/2013   Procedure: CORONARY ARTERY BYPASS GRAFTING (CABG);  Surgeon: Grace Isaac, MD;  Location: [place];  Service: Open Heart Surgery;  Laterality: N/A;  Coronary artery bypass graft times five utilizing the left internal mammary artery and the right greater saphenous vein.  Marland Kitchen ENDOVEIN HARVEST OF GREATER SAPHENOUS VEIN Right 08/02/2013   Procedure: ENDOVEIN HARVEST OF GREATER SAPHENOUS VEIN;  Surgeon: Grace Isaac, MD;  Location: [place];  Service: Open Heart Surgery;  Laterality: Right;  . INTRAOPERATIVE TRANSESOPHAGEAL ECHOCARDIOGRAM N/A 08/02/2013   Procedure: INTRAOPERATIVE TRANSESOPHAGEAL ECHOCARDIOGRAM;  Surgeon: Grace Isaac, MD;  Location: [place];  Service: Open Heart Surgery;  Laterality: N/A;.  EF 40%, LVH and LV dilatation, inf wall hypokinesis (at the time of his MI)  . LEFT HEART CATHETERIZATION WITH CORONARY ANGIOGRAM N/A 08/01/2013   Procedure: LEFT HEART CATHETERIZATION WITH CORONARY ANGIOGRAM;  Surgeon: Blane Ohara, MD;  Location: [facility] CATH LAB;  Service: Cardiovascular;  Laterality: N/A;  . NASAL FRACTURE SURGERY  1979  . SHOULDER SURGERY  1983   left; pins are in place  . TRANSTHORACIC ECHOCARDIOGRAM  07/2013   EF 40%+ wall motion abnl--in the context of acute NSTEMI    Outpatient Medications Prior to Visit  Medication Sig Dispense Refill  . ACCU-CHEK SOFTCLIX LANCETS lancets Use as instructed 100 each 12  . albuterol (PROVENTIL HFA;VENTOLIN HFA) 108 (90 Base) MCG/ACT inhaler Inhale 2 puffs into the lungs every 4 (four) hours as needed  for wheezing or shortness of breath. 1 Inhaler 1  . Alcohol Swabs (B-D SINGLE USE SWABS REGULAR) PADS Use to check blood sugar three times daily 100 each 12  . aspirin EC 81 MG EC tablet Take 1 tablet (81 mg total) by mouth daily.    Marland Kitchen atorvastatin (LIPITOR) 20 MG tablet Take 1 tablet (20 mg total) by mouth daily at 6 PM. 90 tablet 3  . Blood Glucose Calibration (ACCU-CHEK AVIVA) SOLN 1 Bottle by In Vitro route as needed. 1 each 0  . citalopram (CELEXA) 20 MG tablet TAKE 1 TABLET (20 MG TOTAL) BY MOUTH DAILY. 90 tablet 3  . clopidogrel (PLAVIX) 75 MG tablet Take 1 tablet (75 mg total) by mouth daily with breakfast. 90 tablet 3  . gabapentin (NEURONTIN) 300 MG capsule Take 1 capsule (300 mg total) by mouth at bedtime. 90 capsule 3  . glucose blood (ACCU-CHEK AVIVA PLUS) test strip Use as instructed 100 each 12  . glucose blood test strip Use as instructed 100 each 12  . JANUVIA 100 MG tablet TAKE 1 TABLET EVERY DAY 90 tablet 1  . LANCETS ULTRA THIN 30G MISC 1 Units by Does not apply route 4 (four) times daily. Breakfast, Lunch, Dinner, Bedtime 100 each 3  . LORazepam (ATIVAN) 0.5 MG tablet 1-2 tabs po q8h prn anxiety 30 tablet 5  . losartan (COZAAR) 50 MG tablet TAKE 1 TABLET BY MOUTH DAILY. 90 tablet 3  . metFORMIN (GLUCOPHAGE) 1000 MG tablet TAKE 1 TABLET TWICE DAILY WITH MEALS 180 tablet 1  . metoprolol tartrate (LOPRESSOR) 50 MG tablet Take 1 tablet (50 mg total) by mouth 2 (two) times daily. 180 tablet 1  . traZODone (DESYREL) 50 MG tablet TAKE 1 TO 2 TABLETS AT BEDTIME AS NEEDED FOR INSOMNIA 180 tablet 3   No facility-administered medications prior to visit.     Allergies  Allergen Reactions  . Oxycodone Other (See Comments)    Psych/mental adverse effects  . Pioglitazone Swelling and Rash    ROS As per HPI  PE: Blood pressure 125/81, pulse 65, temperature 97.9 F (36.6 C), temperature source Oral, resp. rate 16, height 6' (1.829 m), weight 227 lb 6 oz (103.1 kg), SpO2 99  %. Gen: Alert, well appearing.  Patient is oriented to person, place, time, and situation. AFFECT: pleasant, lucid thought and speech. CV: RRR, no m/r/g.   LUNGS: CTA bilat, nonlabored resps, good aeration in all lung fields. EXT: no clubbing or cyanosis.  no edema.  He has a small, non erythematous hematoma palpable in medial aspect of R LL anteriorly, some greyish ecchymotic appearance diffusely in R calf.  No tenderness.  LABS:  Lab Results  Component Value Date   TSH 2.42 02/27/2017   Lab Results  Component Value Date   WBC 9.7 04/28/2018   HGB 13.5 04/28/2018   HCT 40.6 04/28/2018   MCV 91.8 04/28/2018   PLT 292.0 04/28/2018   Lab Results  Component Value Date   CREATININE 1.01 03/10/2018   BUN 13 03/10/2018   NA 140 03/10/2018  K 4.8 03/10/2018   CL 103 03/10/2018   CO2 30 03/10/2018   Lab Results  Component Value Date   ALT 18 03/10/2018   AST 14 03/10/2018   ALKPHOS 63 03/10/2018   BILITOT 0.8 03/10/2018   Lab Results  Component Value Date   CHOL 100 03/10/2018   Lab Results  Component Value Date   HDL 47.50 03/10/2018   Lab Results  Component Value Date   LDLCALC 27 03/10/2018   Lab Results  Component Value Date   TRIG 129.0 03/10/2018   Lab Results  Component Value Date   CHOLHDL 2 03/10/2018   Lab Results  Component Value Date   PSA 2.77 03/10/2018   PSA 1.71 02/27/2017   Lab Results  Component Value Date   HGBA1C 6.7 (H) 03/10/2018    IMPRESSION AND PLAN:  1) DM 2, historically well controlled. HbA1c today. He will schedule eye exam.  2) HTN: The current medical regimen is effective;  continue present plan and medications. Lytes/cr today.  3) HLD: tolerating statin.  FLP and hepatic panel great 02/2018.  4) R LL hematoma, resolving appropriately.  5) Hx of increased PSA velocity: plan recheck PSA at next f/u in 3 mo.  6) GAD, some insomnia: he takes lorazepam intermittently and responsibly. Controlled substance contract  reviewed with patient today.  Patient signed this and it will be placed in the chart.   UDS at future visit. No new rx for this med was done today.  An After Visit Summary was printed and given to the patient.  FOLLOW UP: Return in about 3 months (around 09/23/2018) for routine chronic illness f/u.  Signed:  Crissie Sickles, MD           06/23/2018

## 2018-06-24 ENCOUNTER — Other Ambulatory Visit: Payer: Self-pay | Admitting: *Deleted

## 2018-06-24 MED ORDER — GLIPIZIDE 5 MG PO TABS: 5.0000 mg | ORAL_TABLET | Freq: Every day | ORAL | 2 refills | Status: DC

## 2018-06-25 ENCOUNTER — Ambulatory Visit (INDEPENDENT_AMBULATORY_CARE_PROVIDER_SITE_OTHER): Payer: Medicare HMO | Admitting: Cardiovascular Disease

## 2018-06-25 ENCOUNTER — Encounter: Payer: Self-pay | Admitting: Cardiovascular Disease

## 2018-06-25 VITALS — BP 134/76 | HR 63 | Ht 72.0 in | Wt 227.0 lb

## 2018-06-25 DIAGNOSIS — E782 Mixed hyperlipidemia: Secondary | ICD-10-CM | POA: Diagnosis not present

## 2018-06-25 DIAGNOSIS — I2510 Atherosclerotic heart disease of native coronary artery without angina pectoris: Secondary | ICD-10-CM | POA: Diagnosis not present

## 2018-06-25 NOTE — Patient Instructions (Signed)
Medication Instructions:  Your physician recommends that you continue on your current medications as directed. Please refer to the Current Medication list given to you today.  If you need a refill on your cardiac medications before your next appointment, please call your pharmacy.   Lab work: Your physician recommends that you return for a FASTING lipid profile, basic metabolic panel, and liver function panel in 1 year  If you have labs (blood work) drawn today and your tests are completely normal, you will receive your results only by: Marland Kitchen MyChart Message (if you have MyChart) OR . A paper copy in the mail If you have any lab test that is abnormal or we need to change your treatment, we will call you to review the results.  Testing/Procedures: None ordered  Follow-Up: At [facility], you and your health needs are our priority.  As part of our continuing mission to provide you with exceptional heart care, we have created designated Provider Care Teams.  These Care Teams include your primary Cardiologist (physician) and Advanced Practice Providers (APPs -  Physician Assistants and Nurse Practitioners) who all work together to provide you with the care you need, when you need it. You will need a follow up appointment in:  12 months.  Please call our office 2 months in advance to schedule this appointment.  You may see Mertie Moores, MD or one of the following Advanced Practice Providers on your designated Care Team: Richardson Dopp, PA-C [place], [place] . Daune Perch, NP  Any Other Special Instructions Will Be Listed Below (If Applicable).

## 2018-06-25 NOTE — Progress Notes (Signed)
[name] Date of Birth  [birth year]       [facility] [address], [address], [place], [place]  [address]   [place], [place]  [address] [phone]     [phone]   Fax  [phone]    Fax [phone]  Problem List: 1. Coronary artery disease status post coronary artery bypass grafting ( Dec. 2014)  2. Type 2 diabetes mellitus 3. Hyperlipidemia 4. Atrial fibrillation - converted after being started on amiodarone.  History of Present Illness:  Mr. Cuadra is a 62 year old gentleman who I have seen in the past. He was admitted on December 12 with a non-ST segment elevation myocardial infarction. He was found to have significant coronary artery disease and had coronary artery bypass grafting.  He developed paroxysmal atrial fibrillation following surgery. He was started on amiodarone. He converted to sinus rhythm and did not require cardioversion.    He was newly diagnosed with diabetes.   He is walking - 25 minutes yesterday.    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    December 08, 2013:  [name] is doing well.  Occasional  episodes of orthostatic hypotension. His glucose levels have been well-controlled.     Nov. 9, 2015:  [name] is doing well .  It has been almost one year since his coronary artery bypass grafting. 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   Aug. 10, 2016:  He is doing well.  Has some MSK pain in his sternum . glucoose is well controlled Is in a new band - 3 piece  Exercises regularly   Sept. 18, 2017:  New band,   Going to [place], [place]. To the [employer] " . Tribute festival to [employer] .  Doing well from a cardiac standpoint Has a cough - thinks its Lisinopril   Has bought a new guitar and a new house  Nov. 8, 2018:    [name] is doing well  Got a new motorcycle this year.  Has been riding  some and enjoying it quite a bit  No CP  Not in the band anymore.   Plays with his bass player - smaller gigs.   June 25, 2018:  Doing well [place] off his motorcycle Riding a stationary bike   No CP , no arrhythmias     Current Outpatient Medications on File Prior to Visit  Medication Sig Dispense Refill  . ACCU-CHEK SOFTCLIX LANCETS lancets Use as instructed 100 each 12  . albuterol (PROVENTIL HFA;VENTOLIN HFA) 108 (90 Base) MCG/ACT inhaler Inhale 2 puffs into the lungs every 4 (four) hours as needed for wheezing or shortness of breath. 1 Inhaler 1  . Alcohol Swabs (B-D SINGLE USE SWABS REGULAR) PADS Use to check blood sugar three times daily 100 each 12  . aspirin EC 81 MG EC tablet Take 1 tablet (81 mg total) by mouth daily.    Marland Kitchen atorvastatin (LIPITOR) 20 MG tablet Take 1 tablet (20 mg total) by mouth daily at 6 PM. 90 tablet 3  . Blood Glucose Calibration (ACCU-CHEK AVIVA) SOLN 1 Bottle by In Vitro route as needed. 1 each 0  . citalopram (CELEXA) 20 MG tablet TAKE 1 TABLET (20 MG TOTAL) BY MOUTH DAILY. 90 tablet 3  . clopidogrel (PLAVIX) 75 MG tablet Take 1  tablet (75 mg total) by mouth daily with breakfast. 90 tablet 3  . gabapentin (NEURONTIN) 300 MG capsule Take 1 capsule (300 mg total) by mouth at bedtime. 90 capsule 3  . glipiZIDE (GLUCOTROL) 5 MG tablet Take 1 tablet (5 mg total) by mouth daily before breakfast. 30 tablet 2  . glucose blood (ACCU-CHEK AVIVA PLUS) test strip Use as instructed 100 each 12  . glucose blood test strip Use as instructed 100 each 12  . JANUVIA 100 MG tablet TAKE 1 TABLET EVERY DAY 90 tablet 1  . LANCETS ULTRA THIN 30G MISC 1 Units by Does not apply route 4 (four) times daily. Breakfast, Lunch, Dinner, Bedtime 100 each 3  . LORazepam (ATIVAN) 0.5 MG tablet 1-2 tabs po q8h prn anxiety 30 tablet 5  . losartan (COZAAR) 50 MG tablet TAKE 1 TABLET BY MOUTH DAILY. 90 tablet 3  . metFORMIN (GLUCOPHAGE) 1000 MG tablet TAKE 1 TABLET TWICE DAILY WITH MEALS  180 tablet 1  . metoprolol tartrate (LOPRESSOR) 50 MG tablet Take 1 tablet (50 mg total) by mouth 2 (two) times daily. 180 tablet 1  . traZODone (DESYREL) 50 MG tablet TAKE 1 TO 2 TABLETS AT BEDTIME AS NEEDED FOR INSOMNIA 180 tablet 3   No current facility-administered medications on file prior to visit.     Allergies  Allergen Reactions  . Oxycodone Other (See Comments)    Psych/mental adverse effects  . Pioglitazone Swelling and Rash    Past Medical History:  Diagnosis Date  . Anxiety   . Chronic renal insufficiency, stage II (mild) 2014/2015   CrCl @ 60 ml/min  . Colon cancer screening 02/2017   Pt declined colonoscopy, but agreed to cologuard screening--this test was POSITIVE on 03/11/17---f/u colonoscopy 01/28/18 w/adenomatous polyps-->recall 1 yr.  . Coronary artery disease    CABG 2014.  . Diabetes ([place]) 07/2013  . Diabetic peripheral neuropathy associated with type 2 diabetes mellitus ([place])   . Dupuytren's disease 09/2017   ortho 09/2017, L hand  . Erectile dysfunction    Dr. Tresa Moore (urol is also doing prostate ca screening).  Refractory to medications.  Pt likely to get penile implant as of urol f/u 01/2015.  Johney Maine hematuria 2017   Pt is to bring this up with his urologist as of 06/2016.  Marland Kitchen Hyperlipidemia   . Hypertension   . Increased prostate specific antigen (PSA) velocity 02/2018   PSA 1.7 to 2.7 from 02/2017 to 02/2018.  Repeat PSA approx Jan/feb 2020.  Marland Kitchen NSTEMI (non-ST elevated myocardial infarction) [facility]) 07/2013   CABG x 22 Jul 2013  . Obesity, Class I, BMI 30-34.9   . Paroxysmal atrial fibrillation ([place]) 07/2013   Started post-op CABG, converted on amiodarone--amiodarone stopped after a couple months  . Stenosing tenosynovitis of thumb 2019   left; incarcerated in extension (ortho visit 09/2017--steroid injection).    Past Surgical History:  Procedure Laterality Date  . COLONOSCOPY W/ POLYPECTOMY  01/28/2018   Mild diverticulosis, small int hem.  Adenomatous  polyp: recall 1 yr per GI  . CORONARY ARTERY BYPASS GRAFT N/A 08/02/2013   Procedure: CORONARY ARTERY BYPASS GRAFTING (CABG);  Surgeon: Grace Isaac, MD;  Location: [place];  Service: Open Heart Surgery;  Laterality: N/A;  Coronary artery bypass graft times five utilizing the left internal mammary artery and the right greater saphenous vein.  Marland Kitchen ENDOVEIN HARVEST OF GREATER SAPHENOUS VEIN Right 08/02/2013   Procedure: ENDOVEIN HARVEST OF GREATER SAPHENOUS VEIN;  Surgeon: Grace Isaac, MD;  [facility];  Service: Open Heart Surgery;  Laterality: Right;  . INTRAOPERATIVE TRANSESOPHAGEAL ECHOCARDIOGRAM N/A 08/02/2013   Procedure: INTRAOPERATIVE TRANSESOPHAGEAL ECHOCARDIOGRAM;  Surgeon: Grace Isaac, MD;  Location: [place];  Service: Open Heart Surgery;  Laterality: N/A;.  EF 40%, LVH and LV dilatation, inf wall hypokinesis (at the time of his MI)  . LEFT HEART CATHETERIZATION WITH CORONARY ANGIOGRAM N/A 08/01/2013   Procedure: LEFT HEART CATHETERIZATION WITH CORONARY ANGIOGRAM;  Surgeon: Blane Ohara, MD;  Location: [facility] CATH LAB;  Service: Cardiovascular;  Laterality: N/A;  . NASAL FRACTURE SURGERY  1979  . SHOULDER SURGERY  1983   left; pins are in place  . TRANSTHORACIC ECHOCARDIOGRAM  07/2013   EF 40%+ wall motion abnl--in the context of acute NSTEMI    Social History   Tobacco Use  Smoking Status Never Smoker  Smokeless Tobacco Never Used    Social History   Substance and Sexual Activity  Alcohol Use No  . Alcohol/week: 0.0 standard drinks    Family History  Problem Relation Age of Onset  . Heart attack Mother   . Heart disease Mother   . Diabetes Mother   . Heart disease Father   . Cancer Brother        lung and liver  . Colon cancer Neg Hx   . Rectal cancer Neg Hx     Reviw of Systems:  Reviewed in the HPI.  All other systems are negative.  Physical Exam: Blood pressure 134/76, pulse 63, height 6' (1.829 m), weight 227 lb (103 kg), SpO2 99 %.  GEN:   Well nourished, well developed in no acute distress HEENT: Normal NECK: No JVD; No carotid bruits LYMPHATICS: No lymphadenopathy CARDIAC: RRR , no murmurs, rubs, gallops RESPIRATORY:  Clear to auscultation without rales, wheezing or rhonchi  ABDOMEN: Soft, non-tender, non-distended MUSCULOSKELETAL:  No edema; No deformity  SKIN: Warm and dry NEUROLOGIC:  Alert and oriented x 3   ECG: June 25, 2018: Normal sinus rhythm at 63 beats a minute.  Normal EKG   Assessment / Plan:   1. Coronary artery disease status post coronary artery bypass grafting (Dec. 2014)    No angina .      2. Hyperlipidemia -     lipid levels were checked in July by his primary medical doctor.  His levels look great.  We will have his primary medical doctor continue to monitor.  4. Atrial fibrillation -no additional episodes of atrial fibrillation.  Continue to monitor.   Mertie Moores, MD  06/25/2018 10:24 AM    [facility],  [place], [place]  [address] Pager [phone] Phone: [phone]; Fax: [phone]

## 2018-08-13 ENCOUNTER — Other Ambulatory Visit: Payer: Self-pay | Admitting: Family Medicine

## 2018-08-23 ENCOUNTER — Other Ambulatory Visit: Payer: Self-pay | Admitting: Nurse Practitioner

## 2018-08-23 MED ORDER — LOSARTAN POTASSIUM 25 MG PO TABS: 50.0000 mg | ORAL_TABLET | Freq: Every day | ORAL | 3 refills | Status: DC

## 2018-09-06 ENCOUNTER — Other Ambulatory Visit: Payer: Self-pay | Admitting: Family Medicine

## 2018-09-15 ENCOUNTER — Other Ambulatory Visit: Payer: Self-pay | Admitting: Family Medicine

## 2018-09-15 NOTE — Telephone Encounter (Signed)
RF request for trazodone LOV: 06/23/18 Next ov: 09/24/18  Last written: 09/30/17 #180 w/ 3RF  Please advise. Thanks.

## 2018-09-21 ENCOUNTER — Other Ambulatory Visit: Payer: Self-pay | Admitting: Family Medicine

## 2018-09-24 ENCOUNTER — Ambulatory Visit (INDEPENDENT_AMBULATORY_CARE_PROVIDER_SITE_OTHER): Payer: Medicare HMO | Admitting: Family Medicine

## 2018-09-24 ENCOUNTER — Encounter: Payer: Self-pay | Admitting: Family Medicine

## 2018-09-24 VITALS — BP 146/77 | HR 65 | Temp 97.5°F | Resp 16 | Ht 72.0 in | Wt 227.5 lb

## 2018-09-24 DIAGNOSIS — E118 Type 2 diabetes mellitus with unspecified complications: Secondary | ICD-10-CM | POA: Diagnosis not present

## 2018-09-24 DIAGNOSIS — N182 Chronic kidney disease, stage 2 (mild): Secondary | ICD-10-CM

## 2018-09-24 DIAGNOSIS — R972 Elevated prostate specific antigen [PSA]: Secondary | ICD-10-CM

## 2018-09-24 DIAGNOSIS — I10 Essential (primary) hypertension: Secondary | ICD-10-CM

## 2018-09-24 LAB — BASIC METABOLIC PANEL
BUN: 18 mg/dL (ref 6–23)
CO2: 27 mEq/L (ref 19–32)
Calcium: 9.2 mg/dL (ref 8.4–10.5)
Chloride: 105 mEq/L (ref 96–112)
Creatinine, Ser: 1.08 mg/dL (ref 0.40–1.50)
GFR: 68.79 mL/min (ref >60.00)
Glucose, Bld: 152 mg/dL — ABNORMAL HIGH (ref 70–99)
Potassium: 4.5 mEq/L (ref 3.5–5.1)
Sodium: 140 mEq/L (ref 135–145)

## 2018-09-24 LAB — HEMOGLOBIN A1C: Hgb A1c MFr Bld: 7.5 % — ABNORMAL HIGH (ref 4.6–6.5)

## 2018-09-24 LAB — PSA: PSA: 3.04 ng/mL (ref 0.10–4.00)

## 2018-09-24 MED ORDER — LOSARTAN POTASSIUM 100 MG PO TABS: 100.0000 mg | ORAL_TABLET | Freq: Every day | ORAL | 3 refills | Status: DC

## 2018-09-24 NOTE — Progress Notes (Signed)
OFFICE VISIT  09/24/2018   CC:  Chief Complaint  Patient presents with  . Follow-up    RCI, pt is not fasting.    HPI:    Patient is a 63 y.o. Caucasian male who presents for 3 mo f/u DM, HTN, HLD, CRI with GFR @ 60 ml/min, and hx of CAD with CABG in 2014. Last cardiology f/u 06/2018, was doing well, no changes made.  DM: A1c was a little elevated last visit so I added glipizide XL 5mg  qAM. Avg fasting about 100.  Rare glucose check later in the day and he doesn't recall any numbers.  BP: avg 140s over 80s.  Occ spike up high but no lows.  CRI: has to be on ASA and plavix, but otherwise no NSAIDs.    He is getting over a URI with cough.    ROS: no CP, no SOB, no dizziness, no HAs, no melena/hematochezia.  No polyuria or polydipsia.  No myalgias or arthralgias.   Past Medical History:  Diagnosis Date  . Anxiety   . Chronic renal insufficiency, stage II (mild) 2014/2015   CrCl @ 60 ml/min  . Colon cancer screening 02/2017   Pt declined colonoscopy, but agreed to cologuard screening--this test was POSITIVE on 03/11/17---f/u colonoscopy 01/28/18 w/adenomatous polyps-->recall 1 yr.  . Coronary artery disease    CABG 2014.  . Diabetes ([place]) 07/2013  . Diabetic peripheral neuropathy associated with type 2 diabetes mellitus ([place])   . Dupuytren's disease 09/2017   ortho 09/2017, L hand  . Erectile dysfunction    Dr. Tresa Moore (urol is also doing prostate ca screening).  Refractory to medications.  Pt likely to get penile implant as of urol f/u 01/2015.  Johney Maine hematuria 2017   Pt is to bring this up with his urologist as of 06/2016.  Marland Kitchen Hyperlipidemia   . Hypertension   . Increased prostate specific antigen (PSA) velocity 02/2018   PSA 1.7 to 2.7 from 02/2017 to 02/2018.  Repeat PSA approx Jan/feb 2020.  Marland Kitchen NSTEMI (non-ST elevated myocardial infarction) [facility]) 07/2013   CABG x 22 Jul 2013  . Obesity, Class I, BMI 30-34.9   . Paroxysmal atrial fibrillation ([place]) 07/2013   Started post-op  CABG, converted on amiodarone--amiodarone stopped after a couple months  . Stenosing tenosynovitis of thumb 2019   left; incarcerated in extension (ortho visit 09/2017--steroid injection).    Past Surgical History:  Procedure Laterality Date  . COLONOSCOPY W/ POLYPECTOMY  01/28/2018   Mild diverticulosis, small int hem.  Adenomatous polyp: recall 1 yr per GI  . CORONARY ARTERY BYPASS GRAFT N/A 08/02/2013   Procedure: CORONARY ARTERY BYPASS GRAFTING (CABG);  Surgeon: Grace Isaac, MD;  Location: [place];  Service: Open Heart Surgery;  Laterality: N/A;  Coronary artery bypass graft times five utilizing the left internal mammary artery and the right greater saphenous vein.  Marland Kitchen ENDOVEIN HARVEST OF GREATER SAPHENOUS VEIN Right 08/02/2013   Procedure: ENDOVEIN HARVEST OF GREATER SAPHENOUS VEIN;  Surgeon: Grace Isaac, MD;  Location: [place];  Service: Open Heart Surgery;  Laterality: Right;  . INTRAOPERATIVE TRANSESOPHAGEAL ECHOCARDIOGRAM N/A 08/02/2013   Procedure: INTRAOPERATIVE TRANSESOPHAGEAL ECHOCARDIOGRAM;  Surgeon: Grace Isaac, MD;  Location: [place];  Service: Open Heart Surgery;  Laterality: N/A;.  EF 40%, LVH and LV dilatation, inf wall hypokinesis (at the time of his MI)  . LEFT HEART CATHETERIZATION WITH CORONARY ANGIOGRAM N/A 08/01/2013   Procedure: LEFT HEART CATHETERIZATION WITH CORONARY ANGIOGRAM;  Surgeon: Blane Ohara,  MD;  Location: [place] CATH [facility];  Service: Cardiovascular;  Laterality: N/A;  . NASAL FRACTURE SURGERY  1979  . SHOULDER SURGERY  1983   left; pins are in place  . TRANSTHORACIC ECHOCARDIOGRAM  07/2013   EF 40%+ wall motion abnl--in the context of acute NSTEMI    Outpatient Medications Prior to Visit  Medication Sig Dispense Refill  . ACCU-CHEK SOFTCLIX LANCETS lancets Use as instructed 100 each 12  . albuterol (PROVENTIL HFA;VENTOLIN HFA) 108 (90 Base) MCG/ACT inhaler Inhale 2 puffs into the lungs every 4 (four) hours as needed for wheezing or shortness  of breath. 1 Inhaler 1  . Alcohol Swabs (B-D SINGLE USE SWABS REGULAR) PADS USE TO CHECK BLOOD SUGAR THREE TIMES DAILY 300 each 1  . aspirin EC 81 MG EC tablet Take 1 tablet (81 mg total) by mouth daily.    Marland Kitchen atorvastatin (LIPITOR) 20 MG tablet TAKE 1 TABLET DAILY AT 6 PM. 90 tablet 1  . Blood Glucose Calibration (ACCU-CHEK AVIVA) SOLN 1 Bottle by In Vitro route as needed. 1 each 0  . citalopram (CELEXA) 20 MG tablet TAKE 1 TABLET (20 MG TOTAL) BY MOUTH DAILY. 90 tablet 3  . clopidogrel (PLAVIX) 75 MG tablet Take 1 tablet (75 mg total) by mouth daily with breakfast. 90 tablet 3  . gabapentin (NEURONTIN) 300 MG capsule Take 1 capsule (300 mg total) by mouth at bedtime. 90 capsule 3  . glipiZIDE (GLUCOTROL) 5 MG tablet Take 1 tablet (5 mg total) by mouth daily before breakfast. 30 tablet 2  . glucose blood (ACCU-CHEK AVIVA PLUS) test strip Use as instructed 100 each 12  . glucose blood test strip Use as instructed 100 each 12  . JANUVIA 100 MG tablet TAKE 1 TABLET EVERY DAY 90 tablet 1  . LANCETS ULTRA THIN 30G MISC 1 Units by Does not apply route 4 (four) times daily. Breakfast, Lunch, Dinner, Bedtime 100 each 3  . LORazepam (ATIVAN) 0.5 MG tablet 1-2 tabs po q8h prn anxiety 30 tablet 5  . metFORMIN (GLUCOPHAGE) 1000 MG tablet TAKE 1 TABLET TWICE DAILY WITH MEALS 180 tablet 1  . metoprolol tartrate (LOPRESSOR) 50 MG tablet Take 1 tablet (50 mg total) by mouth 2 (two) times daily. 180 tablet 1  . traZODone (DESYREL) 50 MG tablet TAKE 1 TO 2 TABLETS AT BEDTIME AS NEEDED FOR INSOMNIA 180 tablet 3  . losartan (COZAAR) 25 MG tablet Take 2 tablets (50 mg total) by mouth daily. 180 tablet 3   No facility-administered medications prior to visit.     Allergies  Allergen Reactions  . Oxycodone Other (See Comments)    Psych/mental adverse effects  . Pioglitazone Swelling and Rash    ROS As per HPI  PE: Blood pressure (!) 146/77, pulse 65, temperature (!) 97.5 F (36.4 C), temperature source  Oral, resp. rate 16, height 6' (1.829 m), weight 227 lb 8 oz (103.2 kg), SpO2 96 %. Gen: Alert, well appearing.  Patient is oriented to person, place, time, and situation. AFFECT: pleasant, lucid thought and speech. CV: RRR, no m/r/g.   LUNGS: CTA bilat, nonlabored resps, good aeration in all lung fields. EXT: no clubbing or cyanosis.  no edema.    LABS:  Lab Results  Component Value Date   TSH 2.42 02/27/2017   Lab Results  Component Value Date   WBC 9.7 04/28/2018   HGB 13.5 04/28/2018   HCT 40.6 04/28/2018   MCV 91.8 04/28/2018   PLT 292.0 04/28/2018   Lab  Results  Component Value Date   CREATININE 1.04 06/23/2018   BUN 22 06/23/2018   NA 139 06/23/2018   K 5.1 06/23/2018   CL 104 06/23/2018   CO2 29 06/23/2018   Lab Results  Component Value Date   ALT 18 03/10/2018   AST 14 03/10/2018   ALKPHOS 63 03/10/2018   BILITOT 0.8 03/10/2018   Lab Results  Component Value Date   CHOL 100 03/10/2018   Lab Results  Component Value Date   HDL 47.50 03/10/2018   Lab Results  Component Value Date   LDLCALC 27 03/10/2018   Lab Results  Component Value Date   TRIG 129.0 03/10/2018   Lab Results  Component Value Date   CHOLHDL 2 03/10/2018   Lab Results  Component Value Date   PSA 2.77 03/10/2018   PSA 1.71 02/27/2017   Lab Results  Component Value Date   HGBA1C 7.6 (H) 06/23/2018   IMPRESSION AND PLAN:  1) DM 2, per the little info provided by pt today, his control is improved. HbA1c today.  2) HTN, not well controlled. Increase losartan to 100 mg qd. Close monitoring of K.  3) HLD: FLP 02/2018 excellent.  Tolerating atorva 20mg  qd. Plan repeat 6 mo.  4) CRI, GFR @ 60 ml/min: he understands to avoid OTC NSAIDs.  Use tylenol for tx of any pain. He hydrates well.  5) Anxiety: he is using loraz very infrequently. CSC is in place. Plan UDS next f/u visit.  An After Visit Summary was printed and given to the patient.  FOLLOW UP: Return in about 3  months (around 12/23/2018) for routine chronic illness f/u.  Signed:  Crissie Sickles, MD           09/24/2018

## 2018-09-30 ENCOUNTER — Other Ambulatory Visit: Payer: Self-pay | Admitting: Family Medicine

## 2018-09-30 ENCOUNTER — Encounter: Payer: Self-pay | Admitting: Family Medicine

## 2018-09-30 ENCOUNTER — Other Ambulatory Visit: Payer: Self-pay | Admitting: *Deleted

## 2018-09-30 DIAGNOSIS — R972 Elevated prostate specific antigen [PSA]: Secondary | ICD-10-CM

## 2018-09-30 MED ORDER — GLIPIZIDE 10 MG PO TABS: 5.0000 mg | ORAL_TABLET | Freq: Every day | ORAL | 2 refills | Status: DC

## 2018-10-12 ENCOUNTER — Other Ambulatory Visit: Payer: Self-pay | Admitting: Family Medicine

## 2018-11-02 ENCOUNTER — Other Ambulatory Visit: Payer: Self-pay | Admitting: Cardiovascular Disease

## 2018-11-02 ENCOUNTER — Other Ambulatory Visit: Payer: Self-pay | Admitting: Family Medicine

## 2018-11-02 NOTE — Telephone Encounter (Signed)
RF request for gabapentin LOV: 09/24/18 Next ov: 12/23/18  Last written: 09/30/17 #90 w/ 3RF  Please advise. Thanks.

## 2018-11-15 ENCOUNTER — Telehealth: Payer: Self-pay | Admitting: Cardiovascular Disease

## 2018-11-15 NOTE — Telephone Encounter (Signed)
He does not need to be on the plavix at this point. DC plavix. Continue ASA 81 mg a day

## 2018-11-15 NOTE — Telephone Encounter (Signed)
[facility] pharmacy is requesting a refill on Clopidogrel. This medication is no longer on the pt's medication list. Does pt still supposed to be taking this medication? Please address

## 2018-11-16 ENCOUNTER — Other Ambulatory Visit: Payer: Self-pay | Admitting: Cardiovascular Disease

## 2018-12-19 ENCOUNTER — Other Ambulatory Visit: Payer: Self-pay | Admitting: Family Medicine

## 2018-12-23 ENCOUNTER — Ambulatory Visit (INDEPENDENT_AMBULATORY_CARE_PROVIDER_SITE_OTHER): Payer: Medicare HMO | Admitting: Family Medicine

## 2018-12-23 ENCOUNTER — Encounter: Payer: Self-pay | Admitting: Family Medicine

## 2018-12-23 ENCOUNTER — Other Ambulatory Visit: Payer: Self-pay

## 2018-12-23 VITALS — BP 131/76 | HR 70 | Wt 228.0 lb

## 2018-12-23 DIAGNOSIS — E7800 Pure hypercholesterolemia, unspecified: Secondary | ICD-10-CM | POA: Diagnosis not present

## 2018-12-23 DIAGNOSIS — I10 Essential (primary) hypertension: Secondary | ICD-10-CM | POA: Diagnosis not present

## 2018-12-23 DIAGNOSIS — E118 Type 2 diabetes mellitus with unspecified complications: Secondary | ICD-10-CM | POA: Diagnosis not present

## 2018-12-23 NOTE — Progress Notes (Signed)
Virtual Visit via Video Note  I connected with Alex Yang on 12/23/18 at 10:00 AM EDT by telephone (Alex Yang unable to get cpu/phone to work for video visit) and verified that I am speaking with the correct person using two identifiers.  Location patient: home Location provider:work or home office Persons participating in the virtual visit: patient, provider  I discussed the limitations of evaluation and management by telemedicine/telephone and the availability of in person appointments. The patient expressed understanding and agreed to proceed.  Telemedicine/telephone visit is a necessity given the COVID-19 restrictions in place at the current time.  HPI: 63 y/o WM with whom I am doing a telephone visit today (due to COVID-19 pandemic restrictions). 3 mo f/u DM 2 (+DPN), HTN, HLD. Has CAD, hx of CABG in 2014. Feeling fine, no acute complaints.  DM 2: last visit his A1c remained up a little, so I increased his glipizide xl to 10mg  qAM.  Glucoses improved: 70s-110 avg fasting.  2H PP and/or hs in low 100s.  HTN: home bp monitoring stable around 120s/70s.  HLD: tolerating statin.  Working on lower fat, lower chol diet.  Already trying to limit simple carbs,etc.  ROS: no CP, no SOB, no wheezing, no cough, no dizziness, no HAs, no rashes, no melena/hematochezia.  No polyuria or polydipsia.  No myalgias or arthralgias. Wt is stable.  Past Medical History:  Diagnosis Date  . Anxiety   . Chronic renal insufficiency, stage II (mild) 2014/2015   CrCl @ 60 ml/min  . Colon cancer screening 02/2017   Alex Yang declined colonoscopy, but agreed to cologuard screening--this test was POSITIVE on 03/11/17---f/u colonoscopy 01/28/18 w/adenomatous polyps-->recall 1 yr.  . Coronary artery disease    CABG 2014.  . Diabetes ([place]) 07/2013  . Diabetic peripheral neuropathy associated with type 2 diabetes mellitus ([name])   . Dupuytren's disease 09/2017   ortho 09/2017, L hand  . Erectile dysfunction    Dr. Tresa Moore (urol is  also doing prostate ca screening).  Refractory to medications.  Alex Yang likely to get penile implant as of urol f/u 01/2015.  Johney Maine hematuria 2017   Alex Yang is to bring this up with his urologist as of 06/2016.  Marland Kitchen Hyperlipidemia   . Hypertension   . Increased prostate specific antigen (PSA) velocity 02/2018   PSA 1.7 to 2.7 from 02/2017 to 02/2018.  Repeat PSA 09/2018 was 3-->referral to urology done.  . NSTEMI (non-ST elevated myocardial infarction) ([place]) 07/2013   CABG x 22 Jul 2013  . Obesity, Class I, BMI 30-34.9   . Paroxysmal atrial fibrillation ([place]) 07/2013   Started post-op CABG, converted on amiodarone--amiodarone stopped after a couple months  . Stenosing tenosynovitis of thumb 2019   left; incarcerated in extension (ortho visit 09/2017--steroid injection).    Past Surgical History:  Procedure Laterality Date  . COLONOSCOPY W/ POLYPECTOMY  01/28/2018   Mild diverticulosis, small int hem.  Adenomatous polyp: recall 1 yr per GI  . CORONARY ARTERY BYPASS GRAFT N/A 08/02/2013   Procedure: CORONARY ARTERY BYPASS GRAFTING (CABG);  Surgeon: Grace Isaac, MD;  Location: [place];  Service: Open Heart Surgery;  Laterality: N/A;  Coronary artery bypass graft times five utilizing the left internal mammary artery and the right greater saphenous vein.  Marland Kitchen ENDOVEIN HARVEST OF GREATER SAPHENOUS VEIN Right 08/02/2013   Procedure: ENDOVEIN HARVEST OF GREATER SAPHENOUS VEIN;  Surgeon: Grace Isaac, MD;  Location: [place];  Service: Open Heart Surgery;  Laterality: Right;  . INTRAOPERATIVE TRANSESOPHAGEAL ECHOCARDIOGRAM N/A  08/02/2013   Procedure: INTRAOPERATIVE TRANSESOPHAGEAL ECHOCARDIOGRAM;  Surgeon: Grace Isaac, MD;  Location: [place];  Service: Open Heart Surgery;  Laterality: N/A;.  EF 40%, LVH and LV dilatation, inf wall hypokinesis (at the time of his MI)  . LEFT HEART CATHETERIZATION WITH CORONARY ANGIOGRAM N/A 08/01/2013   Procedure: LEFT HEART CATHETERIZATION WITH CORONARY ANGIOGRAM;   Surgeon: Blane Ohara, MD;  Location: [facility] CATH LAB;  Service: Cardiovascular;  Laterality: N/A;  . NASAL FRACTURE SURGERY  1979  . SHOULDER SURGERY  1983   left; pins are in place  . TRANSTHORACIC ECHOCARDIOGRAM  07/2013   EF 40%+ wall motion abnl--in the context of acute NSTEMI    Family History  Problem Relation Age of Onset  . Heart attack Mother   . Heart disease Mother   . Diabetes Mother   . Heart disease Father   . Cancer Brother        lung and liver  . Colon cancer Neg Hx   . Rectal cancer Neg Hx      Current Outpatient Medications:  .  ACCU-CHEK SOFTCLIX LANCETS lancets, Use as instructed, Disp: 100 each, Rfl: 12 .  albuterol (PROVENTIL HFA;VENTOLIN HFA) 108 (90 Base) MCG/ACT inhaler, Inhale 2 puffs into the lungs every 4 (four) hours as needed for wheezing or shortness of breath., Disp: 1 Inhaler, Rfl: 1 .  Alcohol Swabs (B-D SINGLE USE SWABS REGULAR) PADS, USE TO CHECK BLOOD SUGAR THREE TIMES DAILY, Disp: 300 each, Rfl: 1 .  aspirin EC 81 MG EC tablet, Take 1 tablet (81 mg total) by mouth daily., Disp: , Rfl:  .  atorvastatin (LIPITOR) 20 MG tablet, TAKE 1 TABLET DAILY AT 6 PM., Disp: 90 tablet, Rfl: 1 .  Blood Glucose Calibration (ACCU-CHEK AVIVA) SOLN, 1 Bottle by In Vitro route as needed., Disp: 1 each, Rfl: 0 .  citalopram (CELEXA) 20 MG tablet, TAKE 1 TABLET EVERY DAY, Disp: 90 tablet, Rfl: 1 .  gabapentin (NEURONTIN) 300 MG capsule, TAKE 1 CAPSULE AT BEDTIME, Disp: 90 capsule, Rfl: 3 .  glipiZIDE (GLUCOTROL XL) 10 MG 24 hr tablet, TAKE 1 TABLET (10 MG TOTAL) BY MOUTH DAILY., Disp: 90 tablet, Rfl: 1 .  glucose blood test strip, Use as instructed, Disp: 100 each, Rfl: 12 .  glucose blood test strip, Use to check blood sugar 3 times daily, Disp: 300 each, Rfl: 3 .  JANUVIA 100 MG tablet, TAKE 1 TABLET EVERY DAY, Disp: 90 tablet, Rfl: 1 .  LANCETS ULTRA THIN 30G MISC, 1 Units by Does not apply route 4 (four) times daily. Breakfast, Lunch, Dinner, Bedtime, Disp: 100  each, Rfl: 3 .  LORazepam (ATIVAN) 0.5 MG tablet, 1-2 tabs po q8h prn anxiety, Disp: 30 tablet, Rfl: 5 .  losartan (COZAAR) 100 MG tablet, TAKE 1 TABLET BY MOUTH EVERY DAY, Disp: 90 tablet, Rfl: 1 .  metFORMIN (GLUCOPHAGE) 1000 MG tablet, TAKE 1 TABLET TWICE DAILY WITH MEALS, Disp: 180 tablet, Rfl: 1 .  metoprolol tartrate (LOPRESSOR) 50 MG tablet, TAKE 1 TABLET (50 MG TOTAL) BY MOUTH 2 (TWO) TIMES DAILY., Disp: 180 tablet, Rfl: 1 .  traZODone (DESYREL) 50 MG tablet, TAKE 1 TO 2 TABLETS AT BEDTIME AS NEEDED FOR INSOMNIA, Disp: 180 tablet, Rfl: 3  EXAM:  VITALS per patient if applicable: BP 350/09 (BP Location: Left Arm, Patient Position: Sitting, Cuff Size: Large)   Pulse 70   Wt 228 lb (103.4 kg)   BMI 30.92 kg/m    GENERAL: alert, oriented, appears  well and in no acute distress  HEENT: atraumatic, conjunttiva clear, no obvious abnormalities on inspection of external nose and ears  NECK: normal movements of the head and neck  LUNGS: on inspection no signs of respiratory distress, breathing rate appears normal, no obvious gross SOB, gasping or wheezing  CV: no obvious cyanosis  MS: moves all visible extremities without noticeable abnormality  PSYCH/NEURO: pleasant and cooperative, no obvious depression or anxiety, speech and thought processing grossly intact  LABS: none today  Lab Results  Component Value Date   TSH 2.42 02/27/2017   Lab Results  Component Value Date   WBC 9.7 04/28/2018   HGB 13.5 04/28/2018   HCT 40.6 04/28/2018   MCV 91.8 04/28/2018   PLT 292.0 04/28/2018   Lab Results  Component Value Date   CREATININE 1.08 09/24/2018   BUN 18 09/24/2018   NA 140 09/24/2018   K 4.5 09/24/2018   CL 105 09/24/2018   CO2 27 09/24/2018   Lab Results  Component Value Date   ALT 18 03/10/2018   AST 14 03/10/2018   ALKPHOS 63 03/10/2018   BILITOT 0.8 03/10/2018   Lab Results  Component Value Date   CHOL 100 03/10/2018   Lab Results  Component Value Date    HDL 47.50 03/10/2018   Lab Results  Component Value Date   LDLCALC 27 03/10/2018   Lab Results  Component Value Date   TRIG 129.0 03/10/2018   Lab Results  Component Value Date   CHOLHDL 2 03/10/2018   Lab Results  Component Value Date   PSA 3.04 09/24/2018   PSA 2.77 03/10/2018   PSA 1.71 02/27/2017   Lab Results  Component Value Date   HGBA1C 7.5 (H) 09/24/2018   ASSESSMENT AND PLAN:  Discussed the following assessment and plan:  1) DM 2 w/DPN:  Control improved. No med changes today. Will repeat A1c as well as urine microalb/cr at next f/u in 3 mo.  2) HTN: The current medical regimen is effective;  continue present plan and medications. BMET at next f/u in 3 mo.  3) HLD: tolerating statin. Recheck hepatic panel and lipids in 3 mo.  4) CAD: asymptomatic. Continue ASA, statin, BB, ARB, and tight glucose control.   I discussed the assessment and treatment plan with the patient. The patient was provided an opportunity to ask questions and all were answered. The patient agreed with the plan and demonstrated an understanding of the instructions.   The patient was advised to call back or seek an in-person evaluation if the symptoms worsen or if the condition fails to improve as anticipated.  Spent 15 min with Alex Yang today, with >50% of this time spent in counseling and care coordination regarding the above problems.  F/u: 3 mo RCI  Signed:  Crissie Sickles, MD           12/23/2018

## 2018-12-29 ENCOUNTER — Other Ambulatory Visit: Payer: Self-pay | Admitting: Family Medicine

## 2018-12-29 MED ORDER — LORAZEPAM 0.5 MG PO TABS: ORAL_TABLET | ORAL | 5 refills | Status: DC

## 2018-12-29 NOTE — Telephone Encounter (Signed)
RF request for lorazepam Last RX 04/12/18 # 30 x 5 RFS Last OV 12/23/2018 No upcoming appts Please advise.

## 2019-02-03 ENCOUNTER — Encounter: Payer: Self-pay | Admitting: Internal Medicine

## 2019-02-03 ENCOUNTER — Other Ambulatory Visit: Payer: Self-pay | Admitting: Family Medicine

## 2019-02-11 ENCOUNTER — Telehealth: Payer: Self-pay

## 2019-02-11 NOTE — Telephone Encounter (Signed)
Pt would like to start receiving refills for Losartan and Lorazepam through [facility] mail order.  RF request for Lorazepam LOV: 12/23/18 Next ov: advised to f/u 3 months Last written: 12/29/18 (30,5) Last CSC:06/23/18 w/ UDS: none  PMP aware printed.   RF request for Losartan LOV: 12/23/18 Next ov: advised to f/u 3 months Last written: 12/20/18 (90,1)  Please advise, thanks.

## 2019-02-13 NOTE — Telephone Encounter (Signed)
Pls tell him we'll have to wait and do these through mail order the next time he is completely running out of RF's of current rx's.-thx

## 2019-02-14 NOTE — Telephone Encounter (Signed)
LM for pt to return call regarding refills.  

## 2019-02-16 NOTE — Telephone Encounter (Signed)
LM for pt return call.

## 2019-02-20 ENCOUNTER — Other Ambulatory Visit: Payer: Self-pay | Admitting: Cardiovascular Disease

## 2019-02-24 ENCOUNTER — Other Ambulatory Visit: Payer: Self-pay | Admitting: Family Medicine

## 2019-03-05 ENCOUNTER — Other Ambulatory Visit: Payer: Self-pay | Admitting: Family Medicine

## 2019-03-13 ENCOUNTER — Other Ambulatory Visit: Payer: Self-pay | Admitting: Cardiovascular Disease

## 2019-04-05 ENCOUNTER — Other Ambulatory Visit: Payer: Self-pay | Admitting: Family Medicine

## 2019-04-18 ENCOUNTER — Other Ambulatory Visit: Payer: Self-pay | Admitting: Family Medicine

## 2019-04-27 ENCOUNTER — Ambulatory Visit (INDEPENDENT_AMBULATORY_CARE_PROVIDER_SITE_OTHER): Payer: Medicare HMO | Admitting: Family Medicine

## 2019-04-27 ENCOUNTER — Telehealth: Payer: Self-pay | Admitting: Family Medicine

## 2019-04-27 ENCOUNTER — Encounter: Payer: Self-pay | Admitting: Family Medicine

## 2019-04-27 ENCOUNTER — Other Ambulatory Visit: Payer: Self-pay

## 2019-04-27 VITALS — BP 130/77 | HR 74 | Wt 227.0 lb

## 2019-04-27 DIAGNOSIS — I10 Essential (primary) hypertension: Secondary | ICD-10-CM

## 2019-04-27 DIAGNOSIS — E7800 Pure hypercholesterolemia, unspecified: Secondary | ICD-10-CM | POA: Diagnosis not present

## 2019-04-27 DIAGNOSIS — E1149 Type 2 diabetes mellitus with other diabetic neurological complication: Secondary | ICD-10-CM | POA: Diagnosis not present

## 2019-04-27 DIAGNOSIS — I2583 Coronary atherosclerosis due to lipid rich plaque: Secondary | ICD-10-CM | POA: Diagnosis not present

## 2019-04-27 DIAGNOSIS — I2510 Atherosclerotic heart disease of native coronary artery without angina pectoris: Secondary | ICD-10-CM

## 2019-04-27 MED ORDER — LORAZEPAM 0.5 MG PO TABS: ORAL_TABLET | ORAL | 5 refills | Status: DC

## 2019-04-27 NOTE — Telephone Encounter (Signed)
Patient aware, voiced understanding.

## 2019-04-27 NOTE — Telephone Encounter (Signed)
Pls call [name] and tell him that I talked with Dr. Acie Fredrickson about the medication question we had and he said he was not sure why sam was on both plavix and aspirin right now.  He recommended stopping aspirin and continue plavix (clopidigrel) daily -indefinitely.-thx

## 2019-04-27 NOTE — Progress Notes (Signed)
Virtual Visit via Video Note  I connected with pt on 04/27/19 at  9:30 AM EDT by a video enabled telemedicine application and verified that I am speaking with the correct person using two identifiers.  Location patient: home Location provider:work or home office Persons participating in the virtual visit: patient, provider  I discussed the limitations of evaluation and management by telemedicine and the availability of in person appointments. The patient expressed understanding and agreed to proceed.  Telemedicine visit is a necessity given the COVID-19 restrictions in place at the current time.  HPI: Patient is a 63 y.o. Caucasian male who presents for f/u DM2 with DPN, HTN, HLD.  He has CAD: hx of NSTEMI 2014, subsequently had CABG.  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   Diet is still fine. Home glucoses 100-123.  Home bp monitoring consistently <130/80.  HLD: tolerating statin.  Wt is stable.  Unfortunately, he tells me today that he and his wife have separated.  He seems to be dealing with this ok->"there's no animosity".  ROS: no CP, no SOB, no wheezing, no cough, no dizziness, no HAs, no rashes, no melena/hematochezia.  No polyuria or polydipsia.  No myalgias or arthralgias.   Past Medical History:  Diagnosis Date  . Anxiety   . Colon cancer screening 02/2017   Pt declined colonoscopy, but agreed to cologuard screening--this test was POSITIVE on 03/11/17---f/u colonoscopy 01/28/18 w/adenomatous polyps-->recall 1 yr.  . Coronary artery disease    CABG 2014.  . Diabetes ([place]) 07/2013  . Diabetic peripheral neuropathy associated with type 2 diabetes mellitus ([place])   . Dupuytren's disease 09/2017   ortho 09/2017, L hand  . Erectile dysfunction    Dr. Tresa Moore (urol is also doing prostate ca screening).  Refractory to medications.  Pt likely to get penile implant as of urol f/u 01/2015.  Johney Maine hematuria 2017   Pt is to bring this up with his  urologist as of 06/2016.  Marland Kitchen Hyperlipidemia   . Hypertension   . Increased prostate specific antigen (PSA) velocity 02/2018   PSA 1.7 to 2.7 from 02/2017 to 02/2018.  Repeat PSA 09/2018 was 3-->referral to urology done.  . NSTEMI (non-ST elevated myocardial infarction) ([place]) 07/2013   CABG x 22 Jul 2013  . Obesity, Class I, BMI 30-34.9   . Paroxysmal atrial fibrillation ([place]) 07/2013   Started post-op CABG, converted on amiodarone--amiodarone stopped after a couple months  . Stenosing tenosynovitis of thumb 2019   left; incarcerated in extension (ortho visit 09/2017--steroid injection).    Past Surgical History:  Procedure Laterality Date  . COLONOSCOPY W/ POLYPECTOMY  01/28/2018   Mild diverticulosis, small int hem.  Adenomatous polyp: recall 1 yr per GI  . CORONARY ARTERY BYPASS GRAFT N/A 08/02/2013   Procedure: CORONARY ARTERY BYPASS GRAFTING (CABG);  Surgeon: Grace Isaac, MD;  Location: [facility];  Service: Open Heart Surgery;  Laterality: N/A;  Coronary artery bypass graft times five utilizing the left internal mammary artery and the right greater saphenous vein.  Marland Kitchen ENDOVEIN HARVEST OF GREATER SAPHENOUS VEIN Right 08/02/2013   Procedure: ENDOVEIN HARVEST OF GREATER SAPHENOUS VEIN;  Surgeon: Grace Isaac, MD;  Location: [place];  Service: Open Heart Surgery;  Laterality: Right;  . INTRAOPERATIVE TRANSESOPHAGEAL ECHOCARDIOGRAM N/A 08/02/2013   Procedure: INTRAOPERATIVE TRANSESOPHAGEAL ECHOCARDIOGRAM;  Surgeon: Grace Isaac, MD;  Location: [facility];  Service: Open Heart Surgery;  Laterality: N/A;.  EF 40%, LVH and LV dilatation,  inf wall hypokinesis (at the time of his MI)  . LEFT HEART CATHETERIZATION WITH CORONARY ANGIOGRAM N/A 08/01/2013   Procedure: LEFT HEART CATHETERIZATION WITH CORONARY ANGIOGRAM;  Surgeon: Blane Ohara, MD;  Location: [facility] C[facility] LAB;  Service: Cardiovascular;  Laterality: N/A;  . NASAL FRACTURE SURGERY  1979  . SHOULDER SURGERY  1983   left; pins are in place   . TRANSTHORACIC ECHOCARDIOGRAM  07/2013   EF 40%+ wall motion abnl--in the context of acute NSTEMI    Family History  Problem Relation Age of Onset  . Heart attack Mother   . Heart disease Mother   . Diabetes Mother   . Heart disease Father   . Cancer Brother        lung and liver  . Colon cancer Neg Hx   . Rectal cancer Neg Hx       Current Outpatient Medications:  .  ACCU-CHEK SOFTCLIX LANCETS lancets, Use as instructed, Disp: 100 each, Rfl: 12 .  albuterol (PROVENTIL HFA;VENTOLIN HFA) 108 (90 Base) MCG/ACT inhaler, Inhale 2 puffs into the lungs every 4 (four) hours as needed for wheezing or shortness of breath., Disp: 1 Inhaler, Rfl: 1 .  Alcohol Swabs (B-D SINGLE USE SWABS REGULAR) PADS, USE TO CHECK BLOOD SUGAR THREE TIMES DAILY, Disp: 300 each, Rfl: 1 .  aspirin EC 81 MG EC tablet, Take 1 tablet (81 mg total) by mouth daily., Disp: , Rfl:  .  atorvastatin (LIPITOR) 20 MG tablet, TAKE 1 TABLET DAILY AT 6 PM., Disp: 90 tablet, Rfl: 1 .  Blood Glucose Calibration (ACCU-CHEK AVIVA) SOLN, 1 Bottle by In Vitro route as needed., Disp: 1 each, Rfl: 0 .  citalopram (CELEXA) 20 MG tablet, TAKE 1 TABLET EVERY DAY, Disp: 90 tablet, Rfl: 1 .  clopidogrel (PLAVIX) 75 MG tablet, TAKE 1 TABLET (75 MG TOTAL) BY MOUTH DAILY WITH BREAKFAST., Disp: 90 tablet, Rfl: 0 .  gabapentin (NEURONTIN) 300 MG capsule, TAKE 1 CAPSULE AT BEDTIME, Disp: 90 capsule, Rfl: 3 .  glipiZIDE (GLUCOTROL XL) 10 MG 24 hr tablet, TAKE 1 TABLET EVERY DAY, Disp: 90 tablet, Rfl: 1 .  glucose blood test strip, Use as instructed, Disp: 100 each, Rfl: 12 .  glucose blood test strip, Use to check blood sugar 3 times daily, Disp: 300 each, Rfl: 3 .  JANUVIA 100 MG tablet, TAKE 1 TABLET EVERY DAY, Disp: 90 tablet, Rfl: 1 .  LANCETS ULTRA THIN 30G MISC, 1 Units by Does not apply route 4 (four) times daily. Breakfast, Lunch, Dinner, Bedtime, Disp: 100 each, Rfl: 3 .  LORazepam (ATIVAN) 0.5 MG tablet, 1-2 tabs po q8h prn anxiety,  Disp: 30 tablet, Rfl: 5 .  losartan (COZAAR) 50 MG tablet, Take 2 tablets (100 mg total) by mouth daily. TAKE 1 TABLET BY MOUTH EVERY DAY, Disp: 180 tablet, Rfl: 2 .  metFORMIN (GLUCOPHAGE) 1000 MG tablet, TAKE 1 TABLET TWICE DAILY WITH MEALS, Disp: 180 tablet, Rfl: 1 .  metoprolol tartrate (LOPRESSOR) 50 MG tablet, TAKE 1 TABLET TWICE DAILY, Disp: 180 tablet, Rfl: 1 .  traZODone (DESYREL) 50 MG tablet, TAKE 1 TO 2 TABLETS AT BEDTIME AS NEEDED FOR INSOMNIA, Disp: 180 tablet, Rfl: 3  EXAM:  VITALS per patient if applicable: BP 123XX123 (BP Location: Left Arm, Patient Position: Sitting, Cuff Size: Large)   Pulse 74   Wt 227 lb (103 kg)   BMI 30.79 kg/m    GENERAL: alert, oriented, appears well and in no acute distress  HEENT: atraumatic, conjunttiva  clear, no obvious abnormalities on inspection of external nose and ears  NECK: normal movements of the head and neck  LUNGS: on inspection no signs of respiratory distress, breathing rate appears normal, no obvious gross SOB, gasping or wheezing  CV: no obvious cyanosis  MS: moves all visible extremities without noticeable abnormality  PSYCH/NEURO: pleasant and cooperative, no obvious depression or anxiety, speech and thought processing grossly intact  LABS: none today  Lab Results  Component Value Date   TSH 2.42 02/27/2017   Lab Results  Component Value Date   WBC 9.7 04/28/2018   HGB 13.5 04/28/2018   HCT 40.6 04/28/2018   MCV 91.8 04/28/2018   PLT 292.0 04/28/2018   Lab Results  Component Value Date   CREATININE 1.08 09/24/2018   BUN 18 09/24/2018   NA 140 09/24/2018   K 4.5 09/24/2018   CL 105 09/24/2018   CO2 27 09/24/2018   Lab Results  Component Value Date   ALT 18 03/10/2018   AST 14 03/10/2018   ALKPHOS 63 03/10/2018   BILITOT 0.8 03/10/2018   Lab Results  Component Value Date   CHOL 100 03/10/2018   Lab Results  Component Value Date   HDL 47.50 03/10/2018   Lab Results  Component Value Date    LDLCALC 27 03/10/2018   Lab Results  Component Value Date   TRIG 129.0 03/10/2018   Lab Results  Component Value Date   CHOLHDL 2 03/10/2018   Lab Results  Component Value Date   PSA 3.04 09/24/2018   PSA 2.77 03/10/2018   PSA 1.71 02/27/2017   Lab Results  Component Value Date   HGBA1C 7.5 (H) 09/24/2018    ASSESSMENT AND PLAN:  Discussed the following assessment and plan:  1) DM 2, stable. HbA1c and urine microalb/cr -future. Feet exam next visit for CPE in 3 mo when in office.  2) HTN: The current medical regimen is effective;  continue present plan and medications. Lytes/cr-future.  3) HLD: tolerating statin.  Diet is fair.  He's active/not sedentary but does no formal exercise. FLP + AST/ALT-future.  4) CAD, hx of CABG: no hx of stents (to my knowledge). He is still on both ASA and Plavix but I'm not sure what the indication is. I have sent a note to his cardiologist to see if he can help me with this question.  5) Psychosocial: he recently separated from his wife.  Emotional support given today.   I discussed the assessment and treatment plan with the patient. The patient was provided an opportunity to ask questions and all were answered. The patient agreed with the plan and demonstrated an understanding of the instructions.   The patient was advised to call back or seek an in-person evaluation if the symptoms worsen or if the condition fails to improve as anticipated.  F/u: 3 mo  Signed:  Crissie Sickles, MD           04/27/2019

## 2019-05-04 ENCOUNTER — Ambulatory Visit: Payer: Medicare HMO

## 2019-05-09 ENCOUNTER — Other Ambulatory Visit: Payer: Self-pay

## 2019-05-09 ENCOUNTER — Other Ambulatory Visit: Payer: Self-pay | Admitting: Cardiovascular Disease

## 2019-05-09 ENCOUNTER — Other Ambulatory Visit: Payer: Self-pay | Admitting: Family Medicine

## 2019-05-09 ENCOUNTER — Ambulatory Visit (INDEPENDENT_AMBULATORY_CARE_PROVIDER_SITE_OTHER): Payer: Medicare HMO | Admitting: Family Medicine

## 2019-05-09 DIAGNOSIS — I10 Essential (primary) hypertension: Secondary | ICD-10-CM

## 2019-05-09 DIAGNOSIS — E1149 Type 2 diabetes mellitus with other diabetic neurological complication: Secondary | ICD-10-CM

## 2019-05-09 DIAGNOSIS — E7800 Pure hypercholesterolemia, unspecified: Secondary | ICD-10-CM

## 2019-05-09 LAB — COMPREHENSIVE METABOLIC PANEL
ALT: 17 U/L (ref 0–53)
AST: 13 U/L (ref 0–37)
Albumin: 4 g/dL (ref 3.5–5.2)
Alkaline Phosphatase: 61 U/L (ref 39–117)
BUN: 14 mg/dL (ref 6–23)
CO2: 30 mEq/L (ref 19–32)
Calcium: 9.9 mg/dL (ref 8.4–10.5)
Chloride: 103 mEq/L (ref 96–112)
Creatinine, Ser: 1.09 mg/dL (ref 0.40–1.50)
GFR: 67.93 mL/min (ref >60.00)
Glucose, Bld: 83 mg/dL (ref 70–99)
Potassium: 4.7 mEq/L (ref 3.5–5.1)
Sodium: 140 mEq/L (ref 135–145)
Total Bilirubin: 0.4 mg/dL (ref 0.2–1.2)
Total Protein: 6.3 g/dL (ref 6.0–8.3)

## 2019-05-09 LAB — MICROALBUMIN / CREATININE URINE RATIO
Creatinine,U: 0.1 mg/dL
Microalb Creat Ratio: 500 mg/g — ABNORMAL HIGH (ref 0.0–30.0)
Microalb, Ur: <0.7 mg/dL (ref 0.0–1.9)

## 2019-05-09 LAB — LIPID PANEL
Cholesterol: 104 mg/dL (ref 0–200)
HDL: 42.9 mg/dL (ref >39.00)
LDL Cholesterol: 31 mg/dL (ref 0–99)
NonHDL: 61.07
Total CHOL/HDL Ratio: 2
Triglycerides: 149 mg/dL (ref 0.0–149.0)
VLDL: 29.8 mg/dL (ref 0.0–40.0)

## 2019-05-09 LAB — HEMOGLOBIN A1C: Hgb A1c MFr Bld: 6.9 % — ABNORMAL HIGH (ref 4.6–6.5)

## 2019-05-09 MED ORDER — GLIPIZIDE ER 10 MG PO TB24: 10.0000 mg | ORAL_TABLET | Freq: Every day | ORAL | 0 refills | Status: DC

## 2019-05-09 NOTE — Telephone Encounter (Signed)
Mistakenly sent Glipizide 5mg  tabs into pharmacy.  Contacted pharmacy and canceled RX renewal.  Patient is on glipizide XL 10mg .  Updated med list.

## 2019-05-10 ENCOUNTER — Other Ambulatory Visit: Payer: Self-pay | Admitting: Family Medicine

## 2019-05-13 ENCOUNTER — Other Ambulatory Visit: Payer: Self-pay

## 2019-05-13 DIAGNOSIS — I10 Essential (primary) hypertension: Secondary | ICD-10-CM

## 2019-05-13 MED ORDER — LOSARTAN POTASSIUM 100 MG PO TABS: 100.0000 mg | ORAL_TABLET | Freq: Every day | ORAL | 1 refills | Status: DC

## 2019-05-18 ENCOUNTER — Ambulatory Visit (INDEPENDENT_AMBULATORY_CARE_PROVIDER_SITE_OTHER): Payer: Medicare HMO | Admitting: Family Medicine

## 2019-05-18 ENCOUNTER — Other Ambulatory Visit: Payer: Self-pay

## 2019-05-18 DIAGNOSIS — I10 Essential (primary) hypertension: Secondary | ICD-10-CM

## 2019-05-18 LAB — BASIC METABOLIC PANEL
BUN: 17 mg/dL (ref 6–23)
CO2: 26 mEq/L (ref 19–32)
Calcium: 9.2 mg/dL (ref 8.4–10.5)
Chloride: 105 mEq/L (ref 96–112)
Creatinine, Ser: 1.17 mg/dL (ref 0.40–1.50)
GFR: 62.6 mL/min (ref >60.00)
Glucose, Bld: 135 mg/dL — ABNORMAL HIGH (ref 70–99)
Potassium: 4.5 mEq/L (ref 3.5–5.1)
Sodium: 139 mEq/L (ref 135–145)

## 2019-05-19 ENCOUNTER — Encounter: Payer: Self-pay | Admitting: Family Medicine

## 2019-06-28 ENCOUNTER — Ambulatory Visit (INDEPENDENT_AMBULATORY_CARE_PROVIDER_SITE_OTHER): Payer: Medicare HMO | Admitting: Family Medicine

## 2019-06-28 ENCOUNTER — Telehealth: Payer: Self-pay

## 2019-06-28 ENCOUNTER — Encounter: Payer: Self-pay | Admitting: Family Medicine

## 2019-06-28 ENCOUNTER — Other Ambulatory Visit: Payer: Self-pay

## 2019-06-28 VITALS — BP 120/76 | HR 77 | Temp 97.8°F | Resp 16 | Wt 228.0 lb

## 2019-06-28 DIAGNOSIS — J209 Acute bronchitis, unspecified: Secondary | ICD-10-CM

## 2019-06-28 DIAGNOSIS — J069 Acute upper respiratory infection, unspecified: Secondary | ICD-10-CM | POA: Diagnosis not present

## 2019-06-28 MED ORDER — AZITHROMYCIN 250 MG PO TABS: ORAL_TABLET | ORAL | 0 refills | Status: DC

## 2019-06-28 MED ORDER — PREDNISONE 20 MG PO TABS: ORAL_TABLET | ORAL | 0 refills | Status: DC

## 2019-06-28 NOTE — Progress Notes (Addendum)
Virtual Visit via Video Note  I connected with Alex Yang on 06/28/19 at  9:30 AM EST by a video enabled telemedicine application and verified that I am speaking with the correct person using two identifiers.  Location patient: home Location provider:work or home office Persons participating in the virtual visit: patient, provider  I discussed the limitations of evaluation and management by telemedicine and the availability of in person appointments. The patient expressed understanding and agreed to proceed.  Telemedicine visit is a necessity given the COVID-19 restrictions in place at the current time.  HPI: 63 y/o WM being seen today for cough. Started out 4 weeks ago with stuffy in nose, PND, mild fatigue/malaise->"like I was getting a cold". Cough started a few days later, PND had increased.  NO chest tightness or SOB or DOE. Some mucous production.  Took mucinex last night, also honey vinager/lemon/water helped some. No wheeze but describes rhonchorus sounds that clear with coughing.  Some HA from coughing so hard. No fevers.  No n/v/d or abd pain.  No loss of taste or smell.    Glucoses have been fine: gradually down from 130s to around 100 fasting the last few weeks.  ROS: no fever, no body aches, no CP, no SOB, no dizziness, no rashes, no melena/hematochezia.  No polyuria or polydipsia.  No myalgias or arthralgias.   Past Medical History:  Diagnosis Date  . Anxiety   . Colon cancer screening 02/2017   Alex Yang declined colonoscopy, but agreed to cologuard screening--this test was POSITIVE on 03/11/17---f/u colonoscopy 01/28/18 w/adenomatous polyps-->recall 1 yr.  . Coronary artery disease    CABG 2014.  . Diabetes mellitus type 2 with complications ([place]) [id number]   DPN and microalbuminuria.  . Dupuytren's disease 09/2017   ortho 09/2017, L hand  . Erectile dysfunction    Dr. Tresa Moore (urol is also doing prostate ca screening).  Refractory to medications.  Alex Yang likely to get penile implant as of  urol f/u 01/2015.  Johney Maine hematuria 2017   Alex Yang is to bring this up with his urologist as of 06/2016.  Marland Kitchen Hyperlipidemia   . Hypertension   . Increased prostate specific antigen (PSA) velocity 02/2018   PSA 1.7 to 2.7 from 02/2017 to 02/2018.  Repeat PSA 09/2018 was 3-->referral to urology done.  . NSTEMI (non-ST elevated myocardial infarction) ([place]) 07/2013   CABG x 22 Jul 2013  . Obesity, Class I, BMI 30-34.9   . Paroxysmal atrial fibrillation ([place]) 07/2013   Started post-op CABG, converted on amiodarone--amiodarone stopped after a couple months  . Stenosing tenosynovitis of thumb 2019   left; incarcerated in extension (ortho visit 09/2017--steroid injection).    Past Surgical History:  Procedure Laterality Date  . COLONOSCOPY W/ POLYPECTOMY  01/28/2018   Mild diverticulosis, small int hem.  Adenomatous polyp: recall 1 yr per GI  . CORONARY ARTERY BYPASS GRAFT N/A 08/02/2013   Procedure: CORONARY ARTERY BYPASS GRAFTING (CABG);  Surgeon: Grace Isaac, MD;  Location: [place];  Service: Open Heart Surgery;  Laterality: N/A;  Coronary artery bypass graft times five utilizing the left internal mammary artery and the right greater saphenous vein.  Marland Kitchen ENDOVEIN HARVEST OF GREATER SAPHENOUS VEIN Right 08/02/2013   Procedure: ENDOVEIN HARVEST OF GREATER SAPHENOUS VEIN;  Surgeon: Grace Isaac, MD;  Location: [place];  Service: Open Heart Surgery;  Laterality: Right;  . INTRAOPERATIVE TRANSESOPHAGEAL ECHOCARDIOGRAM N/A 08/02/2013   Procedure: INTRAOPERATIVE TRANSESOPHAGEAL ECHOCARDIOGRAM;  Surgeon: Grace Isaac, MD;  Location: [place];  Service: Open Heart Surgery;  Laterality: N/A;.  EF 40%, LVH and LV dilatation, inf wall hypokinesis (at the time of his MI)  . LEFT HEART CATHETERIZATION WITH CORONARY ANGIOGRAM N/A 08/01/2013   Procedure: LEFT HEART CATHETERIZATION WITH CORONARY ANGIOGRAM;  Surgeon: Blane Ohara, MD;  Location: [facility] CATH LAB;  Service: Cardiovascular;  Laterality: N/A;  . NASAL  FRACTURE SURGERY  1979  . SHOULDER SURGERY  1983   left; pins are in place  . TRANSTHORACIC ECHOCARDIOGRAM  07/2013   EF 40%+ wall motion abnl--in the context of acute NSTEMI    Family History  Problem Relation Age of Onset  . Heart attack Mother   . Heart disease Mother   . Diabetes Mother   . Heart disease Father   . Cancer Brother        lung and liver  . Colon cancer Neg Hx   . Rectal cancer Neg Hx     SOCIAL HX:  Social History   Socioeconomic History  . Marital status: Married    Spouse name: Not on file  . Number of children: Not on file  . Years of education: Not on file  . Highest education level: Not on file  Occupational History  . Occupation: Environmental manager  . Financial resource strain: Not on file  . Food insecurity    Worry: Not on file    Inability: Not on file  . Transportation needs    Medical: Not on file    Non-medical: Not on file  Tobacco Use  . Smoking status: Never Smoker  . Smokeless tobacco: Never Used  Substance and Sexual Activity  . Alcohol use: No    Alcohol/week: 0.0 standard drinks  . Drug use: No  . Sexual activity: Not on file  Lifestyle  . Physical activity    Days per week: Not on file    Minutes per session: Not on file  . Stress: Not on file  Relationships  . Social Herbalist on phone: Not on file    Gets together: Not on file    Attends religious service: Not on file    Active member of club or organization: Not on file    Attends meetings of clubs or organizations: Not on file    Relationship status: Not on file  Other Topics Concern  . Not on file  Social History Narrative   Single, lives with fiance'.   Has 3 biologic children.   Occupation:  Theme park manager (self employed).   Education: HS   No tobacco, alc, or drugs   Starts cardiac rehab 10/06/13.      Current Outpatient Medications:  .  ACCU-CHEK SOFTCLIX LANCETS lancets, Use as instructed, Disp: 100 each, Rfl: 12 .  albuterol  (PROVENTIL HFA;VENTOLIN HFA) 108 (90 Base) MCG/ACT inhaler, Inhale 2 puffs into the lungs every 4 (four) hours as needed for wheezing or shortness of breath., Disp: 1 Inhaler, Rfl: 1 .  Alcohol Swabs (B-D SINGLE USE SWABS REGULAR) PADS, USE TO CHECK BLOOD SUGAR THREE TIMES DAILY, Disp: 300 each, Rfl: 1 .  atorvastatin (LIPITOR) 20 MG tablet, TAKE 1 TABLET DAILY AT 6 PM., Disp: 90 tablet, Rfl: 1 .  Blood Glucose Calibration (ACCU-CHEK AVIVA) SOLN, 1 Bottle by In Vitro route as needed., Disp: 1 each, Rfl: 0 .  citalopram (CELEXA) 20 MG tablet, TAKE 1 TABLET EVERY DAY, Disp: 90 tablet, Rfl: 1 .  clopidogrel (PLAVIX) 75 MG tablet, TAKE 1 TABLET (75  MG TOTAL) BY MOUTH DAILY WITH BREAKFAST., Disp: 90 tablet, Rfl: 0 .  gabapentin (NEURONTIN) 300 MG capsule, TAKE 1 CAPSULE AT BEDTIME, Disp: 90 capsule, Rfl: 3 .  glipiZIDE (GLUCOTROL XL) 10 MG 24 hr tablet, Take 1 tablet (10 mg total) by mouth daily., Disp: 90 tablet, Rfl: 0 .  glucose blood test strip, Use as instructed, Disp: 100 each, Rfl: 12 .  glucose blood test strip, Use to check blood sugar 3 times daily, Disp: 300 each, Rfl: 3 .  JANUVIA 100 MG tablet, TAKE 1 TABLET EVERY DAY, Disp: 90 tablet, Rfl: 1 .  LANCETS ULTRA THIN 30G MISC, 1 Units by Does not apply route 4 (four) times daily. Breakfast, Lunch, Dinner, Bedtime, Disp: 100 each, Rfl: 3 .  LORazepam (ATIVAN) 0.5 MG tablet, 1-2 tabs po q8h prn anxiety, Disp: 30 tablet, Rfl: 5 .  losartan (COZAAR) 100 MG tablet, Take 1 tablet (100 mg total) by mouth daily. TAKE 1 TABLET BY MOUTH EVERY DAY, Disp: 90 tablet, Rfl: 1 .  metFORMIN (GLUCOPHAGE) 1000 MG tablet, TAKE 1 TABLET TWICE DAILY WITH MEALS, Disp: 180 tablet, Rfl: 1 .  metoprolol tartrate (LOPRESSOR) 50 MG tablet, TAKE 1 TABLET TWICE DAILY, Disp: 180 tablet, Rfl: 1 .  traZODone (DESYREL) 50 MG tablet, TAKE 1 TO 2 TABLETS AT BEDTIME AS NEEDED FOR INSOMNIA, Disp: 180 tablet, Rfl: 3 .  azithromycin (ZITHROMAX) 250 MG tablet, 2 tabs po qd x 1d, then  1 tab po qd x 4d, Disp: 6 tablet, Rfl: 0 .  predniSONE (DELTASONE) 20 MG tablet, 2 tabs po qd x 5d, then 1 tab po qd x 5d, Disp: 15 tablet, Rfl: 0  EXAM:  VITALS per patient if applicable: BP [id number]   Pulse 77   Temp 97.8 F (36.6 C)   Resp 16   Wt 228 lb (103.4 kg)   BMI 30.92 kg/m    GENERAL: alert, oriented, appears well and in no acute distress  HEENT: atraumatic, conjunttiva clear, no obvious abnormalities on inspection of external nose and ears  NECK: normal movements of the head and neck  LUNGS: on inspection no signs of respiratory distress, breathing rate appears normal, no obvious gross SOB, gasping or wheezing  CV: no obvious cyanosis  MS: moves all visible extremities without noticeable abnormality  PSYCH/NEURO: pleasant and cooperative, no obvious depression or anxiety, speech and thought processing grossly intact  LABS: none today.    Chemistry      Component Value Date/Time   NA 139 05/18/2019 1001   NA 137 06/25/2017 1644   K 4.5 05/18/2019 1001   CL 105 05/18/2019 1001   CO2 26 05/18/2019 1001   BUN 17 05/18/2019 1001   BUN 13 06/25/2017 1644   CREATININE 1.17 05/18/2019 1001      Component Value Date/Time   CALCIUM 9.2 05/18/2019 1001   ALKPHOS 61 05/09/2019 0958   AST 13 05/09/2019 0958   ALT 17 05/09/2019 0958   BILITOT 0.4 05/09/2019 0958   BILITOT 0.5 06/25/2017 1644     Lab Results  Component Value Date   HGBA1C 6.9 (H) 05/09/2019    ASSESSMENT AND PLAN:  Discussed the following assessment and plan:  Prolonged URI, with acute bronchitis.  No sign of RAD. Prednisone 40 mg qd x 5d, then 20 mg qd x 5d. Zpack. Mucinex DM q12h prn. Signs/symptoms to call or return for were reviewed and Alex Yang expressed understanding. Possibility of covid infection discussed.  Quarantine instructions given. Needs to come in  for flu vaccine when feeling better. Discussed possible increase in glucoses for the next couple weeks due to taking prednisone.       I discussed the assessment and treatment plan with the patient. The patient was provided an opportunity to ask questions and all were answered. The patient agreed with the plan and demonstrated an understanding of the instructions.   The patient was advised to call back or seek an in-person evaluation if the symptoms worsen or if the condition fails to improve as anticipated.  F/u: prn  Signed:  Crissie Sickles, MD           06/28/2019

## 2019-06-28 NOTE — Telephone Encounter (Signed)
Opened in error

## 2019-06-29 ENCOUNTER — Telehealth: Payer: Self-pay | Admitting: Family Medicine

## 2019-06-29 NOTE — Telephone Encounter (Signed)
Sent as FYI. 

## 2019-06-29 NOTE — Telephone Encounter (Signed)
Patient called in inquiring about getting a flu shot. He is still congested with a cough. He will call back when he is feeling better and has completed his antibiotic.

## 2019-07-18 ENCOUNTER — Ambulatory Visit: Payer: Medicare HMO | Admitting: Cardiovascular Disease

## 2019-07-18 ENCOUNTER — Other Ambulatory Visit: Payer: Medicare HMO

## 2019-08-05 ENCOUNTER — Other Ambulatory Visit: Payer: Self-pay | Admitting: Cardiovascular Disease

## 2019-08-05 ENCOUNTER — Telehealth: Payer: Self-pay | Admitting: Family Medicine

## 2019-08-05 MED ORDER — METOPROLOL TARTRATE 50 MG PO TABS: 50.0000 mg | ORAL_TABLET | Freq: Two times a day (BID) | ORAL | 0 refills | Status: DC

## 2019-08-05 NOTE — Telephone Encounter (Signed)
RX sent

## 2019-08-05 NOTE — Telephone Encounter (Signed)
Patient called requesting metoprolol tartrate (LOPRESSOR) 50 MG tablet to be sent to [facility]

## 2019-09-02 ENCOUNTER — Other Ambulatory Visit: Payer: Medicare HMO

## 2019-09-02 ENCOUNTER — Ambulatory Visit: Payer: Medicare HMO | Admitting: Cardiovascular Disease

## 2019-09-09 ENCOUNTER — Other Ambulatory Visit: Payer: Medicare HMO

## 2019-09-13 ENCOUNTER — Other Ambulatory Visit: Payer: Self-pay | Admitting: Cardiovascular Disease

## 2019-09-13 MED ORDER — CLOPIDOGREL BISULFATE 75 MG PO TABS: 75.0000 mg | ORAL_TABLET | Freq: Every day | ORAL | 0 refills | Status: DC

## 2019-09-14 ENCOUNTER — Other Ambulatory Visit: Payer: Self-pay | Admitting: Family Medicine

## 2019-09-15 NOTE — Telephone Encounter (Signed)
RF request for Trazodone LOV:06/28/19, acute and 04/27/19 for [facility] f/u Next ov: advised to f/u 3 mo. Last written:09/15/18 (180,3)  Please advise. Medication pending. Pharmacy verified.

## 2019-09-23 ENCOUNTER — Other Ambulatory Visit: Payer: Self-pay

## 2019-09-23 ENCOUNTER — Other Ambulatory Visit: Payer: Self-pay | Admitting: Family Medicine

## 2019-09-23 MED ORDER — ATORVASTATIN CALCIUM 20 MG PO TABS: ORAL_TABLET | ORAL | 0 refills | Status: DC

## 2019-09-23 MED ORDER — METFORMIN HCL 1000 MG PO TABS: 1000.0000 mg | ORAL_TABLET | Freq: Two times a day (BID) | ORAL | 0 refills | Status: DC

## 2019-09-23 NOTE — Telephone Encounter (Signed)
[facility] called stating pt needs refills on all of his prescriptions.  Please call pt to see what exactly he needs refills on.

## 2019-09-23 NOTE — Telephone Encounter (Signed)
Pt returned call and appt was made. Pt was advised a 2 week supply would be sent to the pharmacy to last until this appt. He verbalized understanding

## 2019-09-23 NOTE — Addendum Note (Signed)
Addended by: Caroll Rancher L on: 09/23/2019 04:15 PM   Modules accepted: Orders

## 2019-09-23 NOTE — Telephone Encounter (Signed)
Pt was last seen 05/09/2019 he was supposed to be seen in 3 months for a CPE and has not. Pt was called and message was left that patient needed to call and schedule appt and a short supply could be sent to the pharmacy once appt was scheduled

## 2019-09-28 ENCOUNTER — Other Ambulatory Visit: Payer: Self-pay

## 2019-09-30 ENCOUNTER — Ambulatory Visit: Payer: Medicare HMO | Admitting: Family Medicine

## 2019-09-30 NOTE — Progress Notes (Deleted)
OFFICE VISIT  09/30/2019   CC: No chief complaint on file.   HPI:    Patient is a 64 y.o. Caucasian male who presents for 5 mo f/u DM2 with DPN, HTN, HLD.  Past Medical History:  Diagnosis Date  . Anxiety   . Colon cancer screening 02/2017   Pt declined colonoscopy, but agreed to cologuard screening--this test was POSITIVE on 03/11/17---f/u colonoscopy 01/28/18 w/adenomatous polyps-->recall 1 yr.  . Coronary artery disease    CABG 2014.  . 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  . Dupuytren's disease 09/2017   ortho 09/2017, L hand  . Erectile dysfunction    Dr. Manny (urol is also doing prostate ca screening).  Refractory to medications.  Pt likely to get penile implant as of urol f/u 01/2015.  . Gross hematuria 2017   Pt is to bring this up with his urologist as of 06/2016.  . Hyperlipidemia   . Hypertension   . Increased prostate specific antigen (PSA) velocity 02/2018   PSA 1.7 to 2.7 from 02/2017 to 02/2018.  Repeat PSA 09/2018 was 3-->referral to urology done.  . NSTEMI (non-ST elevated myocardial infarction) (HCC) 07/2013   CABG x 22 Jul 2013  . Obesity, Class I, BMI 30-34.9   . Paroxysmal atrial fibrillation (HCC) 07/2013   Started post-op CABG, converted on amiodarone--amiodarone stopped after a couple months  . Stenosing tenosynovitis of thumb 2019   left; incarcerated in extension (ortho visit 09/2017--steroid injection).    Past Surgical History:  Procedure Laterality Date  . COLONOSCOPY W/ POLYPECTOMY  01/28/2018   Mild diverticulosis, small int hem.  Adenomatous polyp: recall 1 yr per GI  . CORONARY ARTERY BYPASS GRAFT N/A 08/02/2013   Procedure: CORONARY ARTERY BYPASS GRAFTING (CABG);  Surgeon: Edward B Gerhardt, MD;  Location: [facility] OR;  Service: Open Heart Surgery;  Laterality: N/A;  Coronary artery bypass graft times five utilizing the left internal mammary artery and the right greater saphenous vein.  . ENDOVEIN  HARVEST OF GREATER SAPHENOUS VEIN Right 08/02/2013   Procedure: ENDOVEIN HARVEST OF GREATER SAPHENOUS VEIN;  Surgeon: Edward B Gerhardt, MD;  Location: [facility] OR;  Service: Open Heart Surgery;  Laterality: Right;  . INTRAOPERATIVE TRANSESOPHAGEAL ECHOCARDIOGRAM N/A 08/02/2013   Procedure: INTRAOPERATIVE TRANSESOPHAGEAL ECHOCARDIOGRAM;  Surgeon: Edward B Gerhardt, MD;  Location: [facility] OR;  Service: Open Heart Surgery;  Laterality: N/A;.  EF 40%, LVH and LV dilatation, inf wall hypokinesis (at the time of his MI)  . LEFT HEART CATHETERIZATION WITH CORONARY ANGIOGRAM N/A 08/01/2013   Procedure: LEFT HEART CATHETERIZATION WITH CORONARY ANGIOGRAM;  Surgeon: Michael D Cooper, MD;  Location: [facility] CATH [facility];  Service: Cardiovascular;  Laterality: N/A;  . NASAL FRACTURE SURGERY  1979  . SHOULDER SURGERY  1983   left; pins are in place  . TRANSTHORACIC ECHOCARDIOGRAM  07/2013   EF 40%+ wall motion abnl--in the context of acute NSTEMI    Outpatient Medications Prior to Visit  Medication Sig Dispense Refill  . ACCU-CHEK SOFTCLIX LANCETS lancets Use as instructed 100 each 12  . albuterol (PROVENTIL HFA;VENTOLIN HFA) 108 (90 Base) MCG/ACT inhaler Inhale 2 puffs into the lungs every 4 (four) hours as needed for wheezing or shortness of breath. 1 Inhaler 1  . Alcohol Swabs (B-D SINGLE USE SWABS REGULAR) PADS USE TO CHECK BLOOD SUGAR THREE TIMES DAILY 300 each 1  . atorvastatin (LIPITOR) 20 MG tablet TAKE 1 TABLET DAILY AT 6 PM. 14 tablet 0  . azithromycin (  ZITHROMAX) 250 MG tablet 2 tabs po qd x 1d, then 1 tab po qd x 4d 6 tablet 0  . Blood Glucose Calibration (ACCU-CHEK AVIVA) SOLN 1 Bottle by In Vitro route as needed. 1 each 0  . Blood Glucose Monitoring Suppl (ACCU-CHEK AVIVA PLUS) w/Device KIT     . citalopram (CELEXA) 20 MG tablet TAKE 1 TABLET EVERY DAY 90 tablet 1  . clopidogrel (PLAVIX) 75 MG tablet Take 1 tablet (75 mg total) by mouth daily with breakfast. Please keep upcoming appt with Dr. Nahser in March for  future refills. Thank you 90 tablet 0  . gabapentin (NEURONTIN) 300 MG capsule TAKE 1 CAPSULE AT BEDTIME 90 capsule 3  . glipiZIDE (GLUCOTROL XL) 10 MG 24 hr tablet Take 1 tablet (10 mg total) by mouth daily. 90 tablet 0  . glucose blood test strip Use as instructed 100 each 12  . glucose blood test strip Use to check blood sugar 3 times daily 300 each 3  . JANUVIA 100 MG tablet TAKE 1 TABLET EVERY DAY 90 tablet 1  . LANCETS ULTRA THIN 30G MISC 1 Units by Does not apply route 4 (four) times daily. Breakfast, Lunch, Dinner, Bedtime 100 each 3  . LORazepam (ATIVAN) 0.5 MG tablet 1-2 tabs po q8h prn anxiety 30 tablet 5  . losartan (COZAAR) 100 MG tablet Take 1 tablet (100 mg total) by mouth daily. TAKE 1 TABLET BY MOUTH EVERY DAY 90 tablet 1  . metFORMIN (GLUCOPHAGE) 1000 MG tablet Take 1 tablet (1,000 mg total) by mouth 2 (two) times daily with a meal. 28 tablet 0  . metoprolol tartrate (LOPRESSOR) 50 MG tablet Take 1 tablet (50 mg total) by mouth 2 (two) times daily. 180 tablet 0  . predniSONE (DELTASONE) 20 MG tablet 2 tabs po qd x 5d, then 1 tab po qd x 5d 15 tablet 0  . traZODone (DESYREL) 50 MG tablet TAKE 1 TO 2 TABLETS AT BEDTIME AS NEEDED FOR INSOMNIA 180 tablet 3   No facility-administered medications prior to visit.    Allergies  Allergen Reactions  . Oxycodone Other (See Comments)    Psych/mental adverse effects  . Pioglitazone Swelling and Rash    ROS As per HPI  PE: There were no vitals taken for this visit. ***  LABS:  Lab Results  Component Value Date   TSH 2.42 02/27/2017   Lab Results  Component Value Date   WBC 9.7 04/28/2018   HGB 13.5 04/28/2018   HCT 40.6 04/28/2018   MCV 91.8 04/28/2018   PLT 292.0 04/28/2018   Lab Results  Component Value Date   CREATININE 1.17 05/18/2019   BUN 17 05/18/2019   NA 139 05/18/2019   K 4.5 05/18/2019   CL 105 05/18/2019   CO2 26 05/18/2019   Lab Results  Component Value Date   ALT 17 05/09/2019   AST 13  05/09/2019   ALKPHOS 61 05/09/2019   BILITOT 0.4 05/09/2019   Lab Results  Component Value Date   CHOL 104 05/09/2019   Lab Results  Component Value Date   HDL 42.90 05/09/2019   Lab Results  Component Value Date   LDLCALC 31 05/09/2019   Lab Results  Component Value Date   TRIG 149.0 05/09/2019   Lab Results  Component Value Date   CHOLHDL 2 05/09/2019   Lab Results  Component Value Date   PSA 3.04 09/24/2018   PSA 2.77 03/10/2018   PSA 1.71 02/27/2017   Lab Results    Component Value Date   HGBA1C 6.9 (H) 05/09/2019    IMPRESSION AND PLAN:  No problem-specific Assessment & Plan notes found for this encounter.   An After Visit Summary was printed and given to the patient.  FOLLOW UP: No follow-ups on file.  Signed:  Phil McGowen, MD           09/30/2019     

## 2019-10-19 ENCOUNTER — Encounter (INDEPENDENT_AMBULATORY_CARE_PROVIDER_SITE_OTHER): Payer: Self-pay

## 2019-10-19 ENCOUNTER — Ambulatory Visit: Payer: Medicare HMO | Admitting: Cardiovascular Disease

## 2019-10-19 ENCOUNTER — Other Ambulatory Visit: Payer: Self-pay

## 2019-10-19 ENCOUNTER — Encounter: Payer: Self-pay | Admitting: Cardiovascular Disease

## 2019-10-19 VITALS — BP 126/74 | HR 66 | Ht 74.0 in | Wt 231.0 lb

## 2019-10-19 DIAGNOSIS — E782 Mixed hyperlipidemia: Secondary | ICD-10-CM | POA: Diagnosis not present

## 2019-10-19 DIAGNOSIS — I2510 Atherosclerotic heart disease of native coronary artery without angina pectoris: Secondary | ICD-10-CM

## 2019-10-19 DIAGNOSIS — Z951 Presence of aortocoronary bypass graft: Secondary | ICD-10-CM | POA: Diagnosis not present

## 2019-10-19 LAB — BASIC METABOLIC PANEL
BUN/Creatinine Ratio: 20 (ref 10–24)
BUN: 23 mg/dL (ref 8–27)
CO2: 23 mmol/L (ref 20–29)
Calcium: 9.2 mg/dL (ref 8.6–10.2)
Chloride: 103 mmol/L (ref 96–106)
Creatinine, Ser: 1.17 mg/dL (ref 0.76–1.27)
GFR calc Af Amer: 75 mL/min/{1.73_m2} (ref >59)
GFR calc non Af Amer: 65 mL/min/{1.73_m2} (ref >59)
Glucose: 116 mg/dL — ABNORMAL HIGH (ref 65–99)
Potassium: 4.4 mmol/L (ref 3.5–5.2)
Sodium: 141 mmol/L (ref 134–144)

## 2019-10-19 LAB — HEPATIC FUNCTION PANEL
ALT: 33 IU/L (ref 0–44)
AST: 26 IU/L (ref 0–40)
Albumin: 4.5 g/dL (ref 3.8–4.8)
Alkaline Phosphatase: 85 IU/L (ref 39–117)
Bilirubin Total: 0.4 mg/dL (ref 0.0–1.2)
Bilirubin, Direct: 0.17 mg/dL (ref 0.00–0.40)
Total Protein: 6.5 g/dL (ref 6.0–8.5)

## 2019-10-19 LAB — LIPID PANEL
Chol/HDL Ratio: 2.1 ratio (ref 0.0–5.0)
Cholesterol, Total: 103 mg/dL (ref 100–199)
HDL: 50 mg/dL (ref >39)
LDL Chol Calc (NIH): 37 mg/dL (ref 0–99)
Triglycerides: 79 mg/dL (ref 0–149)
VLDL Cholesterol Cal: 16 mg/dL (ref 5–40)

## 2019-10-19 NOTE — Patient Instructions (Signed)
Medication Instructions:   Your physician recommends that you continue on your current medications as directed. Please refer to the Current Medication list given to you today.  *If you need a refill on your cardiac medications before your next appointment, please call your pharmacy*   Lab Work:  TODAY--LIPIDS, LFTs, AND BMET  If you have labs (blood work) drawn today and your tests are completely normal, you will receive your results only by: Marland Kitchen MyChart Message (if you have MyChart) OR . A paper copy in the mail If you have any lab test that is abnormal or we need to change your treatment, we will call you to review the results.   Follow-Up: At [facility], you and your health needs are our priority.  As part of our continuing mission to provide you with exceptional heart care, we have created designated Provider Care Teams.  These Care Teams include your primary Cardiologist (physician) and Advanced Practice Providers (APPs -  Physician Assistants and Nurse Practitioners) who all work together to provide you with the care you need, when you need it.  We recommend signing up for the patient portal called "MyChart".  Sign up information is provided on this After Visit Summary.  MyChart is used to connect with patients for Virtual Visits (Telemedicine).  Patients are able to view lab/test results, encounter notes, upcoming appointments, etc.  Non-urgent messages can be sent to your provider as well.   To learn more about what you can do with MyChart, go to [email or web address].    Your next appointment:   12 month(s)  The format for your next appointment:   In Person  Provider:   Mertie Moores, MD

## 2019-10-19 NOTE — Progress Notes (Signed)
Alex Yang Date of Birth  01-24-1956       [facility] [address], [place], [place]  [address]   Yang, Alex  [address] [phone]     [phone]   Fax  [phone]    Fax [phone]  Problem List: 1. Coronary artery disease status post coronary artery bypass grafting ( Dec. 2014)  2. Type 2 diabetes mellitus 3. Hyperlipidemia 4. Atrial fibrillation - converted after being started on amiodarone.     Mr. Alex Yang is a 64 year old gentleman who I have seen in the past. He was admitted on December 12 with a non-ST segment elevation myocardial infarction. He was found to have significant coronary artery disease and had coronary artery bypass grafting.  He developed paroxysmal atrial fibrillation following surgery. He was started on amiodarone. He converted to sinus rhythm and did not require cardioversion.    He was newly diagnosed with diabetes.   He is walking - 25 minutes yesterday.    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    December 08, 2013:  Alex Yang is doing well.  Occasional  episodes of orthostatic hypotension. His glucose levels have been well-controlled.     Nov. 9, 2015:  Alex Yang is doing well .  It has been almost one year since his coronary artery bypass grafting. 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   Aug. 10, 2016:  He is doing well.  Has some MSK pain in his sternum . glucoose is well controlled Is in a new band - 3 piece  Exercises regularly   Sept. 18, 2017:  New band,   Going to [place], [place]. To the [employer] " . Tribute festival to [employer] .  Doing well from a cardiac standpoint Has a cough - thinks its Lisinopril   Has bought a new guitar and a new house  Nov. 8, 2018:    Alex Yang is doing well  Got a new motorcycle this year.  Has been riding some and enjoying it quite a  bit  No CP  Not in the band anymore.   Plays with his bass player - smaller gigs.   June 25, 2018:  Doing well [place] off his motorcycle Riding a stationary bike   No CP , no arrhythmias   10/19/2019: Alex Yang is seen back today for follow-up visit.  He has a history of coronary artery disease, diabetes, hyperlipidemia and hypertension. He and his wife have separated.  Is not playing in the band     Current Outpatient Medications on File Prior to Visit  Medication Sig Dispense Refill  . ACCU-CHEK SOFTCLIX LANCETS lancets Use as instructed 100 each 12  . albuterol (PROVENTIL HFA;VENTOLIN HFA) 108 (90 Base) MCG/ACT inhaler Inhale 2 puffs into the lungs every 4 (four) hours as needed for wheezing or shortness of breath. 1 Inhaler 1  . Alcohol Swabs (B-D SINGLE USE SWABS REGULAR) PADS USE TO CHECK BLOOD SUGAR THREE TIMES DAILY 300 each 1  . atorvastatin (LIPITOR) 20 MG tablet TAKE 1 TABLET DAILY AT 6 PM. 14 tablet 0  . Blood Glucose Calibration (ACCU-CHEK AVIVA) SOLN 1 Bottle by In Vitro route as needed. 1 each 0  . Blood Glucose Monitoring Suppl (ACCU-CHEK AVIVA PLUS) w/Device KIT     . citalopram (  CELEXA) 20 MG tablet TAKE 1 TABLET EVERY DAY 90 tablet 1  . clopidogrel (PLAVIX) 75 MG tablet Take 1 tablet (75 mg total) by mouth daily with breakfast. Please keep upcoming appt with Dr. Acie Fredrickson in March for future refills. Thank you 90 tablet 0  . gabapentin (NEURONTIN) 300 MG capsule TAKE 1 CAPSULE AT BEDTIME 90 capsule 3  . glipiZIDE (GLUCOTROL XL) 10 MG 24 hr tablet Take 1 tablet (10 mg total) by mouth daily. 90 tablet 0  . glucose blood test strip Use as instructed 100 each 12  . glucose blood test strip Use to check blood sugar 3 times daily 300 each 3  . JANUVIA 100 MG tablet TAKE 1 TABLET EVERY DAY 90 tablet 1  . LANCETS ULTRA THIN 30G MISC 1 Units by Does not apply route 4 (four) times daily. Breakfast, Lunch, Dinner, Bedtime 100 each 3  . LORazepam (ATIVAN) 0.5 MG tablet 1-2 tabs po  q8h prn anxiety 30 tablet 5  . losartan (COZAAR) 100 MG tablet Take 1 tablet (100 mg total) by mouth daily. TAKE 1 TABLET BY MOUTH EVERY DAY 90 tablet 1  . metFORMIN (GLUCOPHAGE) 1000 MG tablet Take 1 tablet (1,000 mg total) by mouth 2 (two) times daily with a meal. 28 tablet 0  . metoprolol tartrate (LOPRESSOR) 50 MG tablet Take 1 tablet (50 mg total) by mouth 2 (two) times daily. 180 tablet 0  . traZODone (DESYREL) 50 MG tablet TAKE 1 TO 2 TABLETS AT BEDTIME AS NEEDED FOR INSOMNIA 180 tablet 3   No current facility-administered medications on file prior to visit.    Allergies  Allergen Reactions  . Oxycodone Other (See Comments)    Psych/mental adverse effects  . Pioglitazone Swelling and Rash    Past Medical History:  Diagnosis Date  . Anxiety   . Colon cancer screening 02/2017   Pt declined colonoscopy, but agreed to cologuard screening--this test was POSITIVE on 03/11/17---f/u colonoscopy 01/28/18 w/adenomatous polyps-->recall 1 yr.  . Coronary artery disease    CABG 2014.  . Diabetes mellitus type 2 with complications ([place]) 10/7046   DPN and microalbuminuria.  . Dupuytren's disease 09/2017   ortho 09/2017, L hand  . Erectile dysfunction    Dr. Tresa Moore (urol is also doing prostate ca screening).  Refractory to medications.  Pt likely to get penile implant as of urol f/u 01/2015.  Johney Maine hematuria 2017   Pt is to bring this up with his urologist as of 06/2016.  Marland Kitchen Hyperlipidemia   . Hypertension   . Increased prostate specific antigen (PSA) velocity 02/2018   PSA 1.7 to 2.7 from 02/2017 to 02/2018.  Repeat PSA 09/2018 was 3-->referral to urology done.  . NSTEMI (non-ST elevated myocardial infarction) ([place]) 07/2013   CABG x 22 Jul 2013  . Obesity, Class I, BMI 30-34.9   . Paroxysmal atrial fibrillation ([place]) 07/2013   Started post-op CABG, converted on amiodarone--amiodarone stopped after a couple months  . Stenosing tenosynovitis of thumb 2019   left; incarcerated in extension  (ortho visit 09/2017--steroid injection).    Past Surgical History:  Procedure Laterality Date  . COLONOSCOPY W/ POLYPECTOMY  01/28/2018   Mild diverticulosis, small int hem.  Adenomatous polyp: recall 1 yr per GI  . CORONARY ARTERY BYPASS GRAFT N/A 08/02/2013   Procedure: CORONARY ARTERY BYPASS GRAFTING (CABG);  Surgeon: Grace Isaac, MD;  Location: [place];  Service: Open Heart Surgery;  Laterality: N/A;  Coronary artery bypass graft times five utilizing the  left internal mammary artery and the right greater saphenous vein.  Marland Kitchen ENDOVEIN HARVEST OF GREATER SAPHENOUS VEIN Right 08/02/2013   Procedure: ENDOVEIN HARVEST OF GREATER SAPHENOUS VEIN;  Surgeon: Grace Isaac, MD;  Location: [place];  Service: Open Heart Surgery;  Laterality: Right;  . INTRAOPERATIVE TRANSESOPHAGEAL ECHOCARDIOGRAM N/A 08/02/2013   Procedure: INTRAOPERATIVE TRANSESOPHAGEAL ECHOCARDIOGRAM;  Surgeon: Grace Isaac, MD;  Location: [place];  Service: Open Heart Surgery;  Laterality: N/A;.  EF 40%, LVH and LV dilatation, inf wall hypokinesis (at the time of his MI)  . LEFT HEART CATHETERIZATION WITH CORONARY ANGIOGRAM N/A 08/01/2013   Procedure: LEFT HEART CATHETERIZATION WITH CORONARY ANGIOGRAM;  Surgeon: Blane Ohara, MD;  [facility];  Service: Cardiovascular;  Laterality: N/A;  . NASAL FRACTURE SURGERY  1979  . SHOULDER SURGERY  1983   left; pins are in place  . TRANSTHORACIC ECHOCARDIOGRAM  07/2013   EF 40%+ wall motion abnl--in the context of acute NSTEMI    Social History   Tobacco Use  Smoking Status Never Smoker  Smokeless Tobacco Never Used    Social History   Substance and Sexual Activity  Alcohol Use No  . Alcohol/week: 0.0 standard drinks    Family History  Problem Relation Age of Onset  . Heart attack Mother   . Heart disease Mother   . Diabetes Mother   . Heart disease Father   . Cancer Brother        lung and liver  . Colon cancer Neg Hx   . Rectal cancer Neg Hx      Reviw of Systems:  Reviewed in the HPI.  All other systems are negative.   Physical Exam: Blood pressure 126/74, pulse 66, height 6' 2"  (1.88 m), weight 231 lb (104.8 kg), SpO2 98 %.  GEN:  Well nourished, well developed in no acute distress HEENT: Normal NECK: No JVD; No carotid bruits LYMPHATICS: No lymphadenopathy CARDIAC: RRR   RESPIRATORY:  Clear to auscultation without rales, wheezing or rhonchi  ABDOMEN: Soft, non-tender, non-distended MUSCULOSKELETAL:  No edema; No deformity  SKIN: Warm and dry NEUROLOGIC:  Alert and oriented x 3   ECG: 10/19/2019: Normal sinus rhythm at 66.  No ST or T wave changes.  Assessment / Plan:   1. Coronary artery disease status post coronary artery bypass grafting (Dec. 2014)     no angina , encouraged him to exercise on a regular basis.   2. Hyperlipidemia -   Check labs today   4. Atrial fibrillation   No further episodes of AF .   Mertie Moores, MD  10/19/2019 9:39 AM    [facility] [address],  [place], [place]  [address] Pager [phone] Phone: [phone]; Fax: [phone]

## 2019-11-03 ENCOUNTER — Other Ambulatory Visit: Payer: Self-pay

## 2019-11-04 ENCOUNTER — Encounter: Payer: Medicare HMO | Admitting: Family Medicine

## 2019-11-04 DIAGNOSIS — Z0289 Encounter for other administrative examinations: Secondary | ICD-10-CM

## 2019-11-04 NOTE — Progress Notes (Deleted)
Office Note 11/04/2019  CC: No chief complaint on file.   HPI:  Alex Yang is a 64 y.o. White male who is here for annual health maintenance exam. A/P as of last visit (04/27/19, VIRTUAL): "1) DM 2, stable. HbA1c and urine microalb/cr -future. Feet exam next visit for CPE in 3 mo when in office.  2) HTN: The current medical regimen is effective;  continue present plan and medications. Lytes/cr-future.  3) HLD: tolerating statin.  Diet is fair.  He's active/not sedentary but does no formal exercise. FLP + AST/ALT-future.  4) CAD, hx of CABG: no hx of stents (to my knowledge).  It has been 6 yrs since his CABG! He is still on both ASA and Plavix but I'm not sure what the indication is. I have sent a note to his cardiologist to see if he can help me with this question.  5) Psychosocial: he recently separated from his wife.  Emotional support given today."  Interim hx: ***  Feet:   Past Medical History:  Diagnosis Date  . Anxiety   . Colon cancer screening 02/2017   Pt declined colonoscopy, but agreed to cologuard screening--this test was POSITIVE on 03/11/17---f/u colonoscopy 01/28/18 w/adenomatous polyps-->recall 1 yr.  . Coronary artery disease    CABG 2014.  . Diabetes mellitus type 2 with complications ([place]) 27/7824   DPN and microalbuminuria.  . Dupuytren's disease 09/2017   ortho 09/2017, L hand  . Erectile dysfunction    Dr. Tresa Moore (urol is also doing prostate ca screening).  Refractory to medications.  Pt likely to get penile implant as of urol f/u 01/2015.  Johney Maine hematuria 2017   Pt is to bring this up with his urologist as of 06/2016.  Marland Kitchen Hyperlipidemia   . Hypertension   . Increased prostate specific antigen (PSA) velocity 02/2018   PSA 1.7 to 2.7 from 02/2017 to 02/2018.  Repeat PSA 09/2018 was 3-->referral to urology done.  . NSTEMI (non-ST elevated myocardial infarction) ([place]) 07/2013   CABG x 22 Jul 2013  . Obesity, Class I, BMI 30-34.9   .  Paroxysmal atrial fibrillation ([place]) 07/2013   Started post-op CABG, converted on amiodarone--amiodarone stopped after a couple months  . Stenosing tenosynovitis of thumb 2019   left; incarcerated in extension (ortho visit 09/2017--steroid injection).    Past Surgical History:  Procedure Laterality Date  . COLONOSCOPY W/ POLYPECTOMY  01/28/2018   Mild diverticulosis, small int hem.  Adenomatous polyp: recall 1 yr per GI  . CORONARY ARTERY BYPASS GRAFT N/A 08/02/2013   Procedure: CORONARY ARTERY BYPASS GRAFTING (CABG);  Surgeon: Grace Isaac, MD;  Location: [facility];  Service: Open Heart Surgery;  Laterality: N/A;  Coronary artery bypass graft times five utilizing the left internal mammary artery and the right greater saphenous vein.  Marland Kitchen ENDOVEIN HARVEST OF GREATER SAPHENOUS VEIN Right 08/02/2013   Procedure: ENDOVEIN HARVEST OF GREATER SAPHENOUS VEIN;  Surgeon: Grace Isaac, MD;  Location: [facility];  Service: Open Heart Surgery;  Laterality: Right;  . INTRAOPERATIVE TRANSESOPHAGEAL ECHOCARDIOGRAM N/A 08/02/2013   Procedure: INTRAOPERATIVE TRANSESOPHAGEAL ECHOCARDIOGRAM;  Surgeon: Grace Isaac, MD;  Location: [facility];  Service: Open Heart Surgery;  Laterality: N/A;.  EF 40%, LVH and LV dilatation, inf wall hypokinesis (at the time of his MI)  . LEFT HEART CATHETERIZATION WITH CORONARY ANGIOGRAM N/A 08/01/2013   Procedure: LEFT HEART CATHETERIZATION WITH CORONARY ANGIOGRAM;  Surgeon: Blane Ohara, MD;  Location: [facility] CATH LAB;  Service: Cardiovascular;  Laterality:  N/A;  . NASAL FRACTURE SURGERY  1979  . SHOULDER SURGERY  1983   left; pins are in place  . TRANSTHORACIC ECHOCARDIOGRAM  07/2013   EF 40%+ wall motion abnl--in the context of acute NSTEMI    Family History  Problem Relation Age of Onset  . Heart attack Mother   . Heart disease Mother   . Diabetes Mother   . Heart disease Father   . Cancer Brother        lung and liver  . Colon cancer Neg Hx   . Rectal cancer Neg Hx      Social History   Socioeconomic History  . Marital status: Married    Spouse name: Not on file  . Number of children: Not on file  . Years of education: Not on file  . Highest education level: Not on file  Occupational History  . Occupation: Physicist, medical  Tobacco Use  . Smoking status: Never Smoker  . Smokeless tobacco: Never Used  Substance and Sexual Activity  . Alcohol use: No    Alcohol/week: 0.0 standard drinks  . Drug use: No  . Sexual activity: Not on file  Other Topics Concern  . Not on file  Social History Narrative   Single, lives with fiance'.   Has 3 biologic children.   Occupation:  Theme park manager (self employed).   Education: HS   No tobacco, alc, or drugs   Starts cardiac rehab 10/06/13.   Social Determinants of Health   Financial Resource Strain:   . Difficulty of Paying Living Expenses:   Food Insecurity:   . Worried About Charity fundraiser in the Last Year:   . Arboriculturist in the Last Year:   Transportation Needs:   . Film/video editor (Medical):   Marland Kitchen Lack of Transportation (Non-Medical):   Physical Activity:   . Days of Exercise per Week:   . Minutes of Exercise per Session:   Stress:   . Feeling of Stress :   Social Connections:   . Frequency of Communication with Friends and Family:   . Frequency of Social Gatherings with Friends and Family:   . Attends Religious Services:   . Active Member of Clubs or Organizations:   . Attends Archivist Meetings:   Marland Kitchen Marital Status:   Intimate Partner Violence:   . Fear of Current or Ex-Partner:   . Emotionally Abused:   Marland Kitchen Physically Abused:   . Sexually Abused:     Outpatient Medications Prior to Visit  Medication Sig Dispense Refill  . ACCU-CHEK SOFTCLIX LANCETS lancets Use as instructed 100 each 12  . albuterol (PROVENTIL HFA;VENTOLIN HFA) 108 (90 Base) MCG/ACT inhaler Inhale 2 puffs into the lungs every 4 (four) hours as needed for wheezing or shortness of breath. 1 Inhaler  1  . Alcohol Swabs (B-D SINGLE USE SWABS REGULAR) PADS USE TO CHECK BLOOD SUGAR THREE TIMES DAILY 300 each 1  . atorvastatin (LIPITOR) 20 MG tablet TAKE 1 TABLET DAILY AT 6 PM. 14 tablet 0  . Blood Glucose Calibration (ACCU-CHEK AVIVA) SOLN 1 Bottle by In Vitro route as needed. 1 each 0  . Blood Glucose Monitoring Suppl (ACCU-CHEK AVIVA PLUS) w/Device KIT     . citalopram (CELEXA) 20 MG tablet TAKE 1 TABLET EVERY DAY 90 tablet 1  . clopidogrel (PLAVIX) 75 MG tablet Take 1 tablet (75 mg total) by mouth daily with breakfast. Please keep upcoming appt with Dr. Acie Fredrickson in March for future refills.  Thank you 90 tablet 0  . gabapentin (NEURONTIN) 300 MG capsule TAKE 1 CAPSULE AT BEDTIME 90 capsule 3  . glipiZIDE (GLUCOTROL XL) 10 MG 24 hr tablet Take 1 tablet (10 mg total) by mouth daily. 90 tablet 0  . glucose blood test strip Use as instructed 100 each 12  . glucose blood test strip Use to check blood sugar 3 times daily 300 each 3  . JANUVIA 100 MG tablet TAKE 1 TABLET EVERY DAY 90 tablet 1  . LANCETS ULTRA THIN 30G MISC 1 Units by Does not apply route 4 (four) times daily. Breakfast, Lunch, Dinner, Bedtime 100 each 3  . LORazepam (ATIVAN) 0.5 MG tablet 1-2 tabs po q8h prn anxiety 30 tablet 5  . losartan (COZAAR) 100 MG tablet Take 1 tablet (100 mg total) by mouth daily. TAKE 1 TABLET BY MOUTH EVERY DAY 90 tablet 1  . metFORMIN (GLUCOPHAGE) 1000 MG tablet Take 1 tablet (1,000 mg total) by mouth 2 (two) times daily with a meal. 28 tablet 0  . metoprolol tartrate (LOPRESSOR) 50 MG tablet Take 1 tablet (50 mg total) by mouth 2 (two) times daily. 180 tablet 0  . traZODone (DESYREL) 50 MG tablet TAKE 1 TO 2 TABLETS AT BEDTIME AS NEEDED FOR INSOMNIA 180 tablet 3   No facility-administered medications prior to visit.    Allergies  Allergen Reactions  . Oxycodone Other (See Comments)    Psych/mental adverse effects  . Pioglitazone Swelling and Rash    ROS *** PE; There were no vitals taken for  this visit. *** Pertinent labs:  Lab Results  Component Value Date   TSH 2.42 02/27/2017   Lab Results  Component Value Date   WBC 9.7 04/28/2018   HGB 13.5 04/28/2018   HCT 40.6 04/28/2018   MCV 91.8 04/28/2018   PLT 292.0 04/28/2018   Lab Results  Component Value Date   CREATININE 1.17 10/19/2019   BUN 23 10/19/2019   NA 141 10/19/2019   K 4.4 10/19/2019   CL 103 10/19/2019   CO2 23 10/19/2019   Lab Results  Component Value Date   ALT 33 10/19/2019   AST 26 10/19/2019   ALKPHOS 85 10/19/2019   BILITOT 0.4 10/19/2019   Lab Results  Component Value Date   CHOL 103 10/19/2019   Lab Results  Component Value Date   HDL 50 10/19/2019   Lab Results  Component Value Date   LDLCALC 37 10/19/2019   Lab Results  Component Value Date   TRIG 79 10/19/2019   Lab Results  Component Value Date   CHOLHDL 2.1 10/19/2019   Lab Results  Component Value Date   PSA 3.04 09/24/2018   PSA 2.77 03/10/2018   PSA 1.71 02/27/2017   Lab Results  Component Value Date   HGBA1C 6.9 (H) 05/09/2019    ASSESSMENT AND PLAN:   Health maintenance exam: Reviewed age and gender appropriate health maintenance issues (prudent diet, regular exercise, health risks of tobacco and excessive alcohol, use of seatbelts, fire alarms in home, use of sunscreen).  Also reviewed age and gender appropriate health screening as well as vaccine recommendations. Vaccines:pneumovax 23 (needed b/c he just recently turned 89 and it has been 5 yrs since his last pneumovax)->***  All others UTD. Labs: fasting HP + PSA (DM, HTN, HLD, CAD,  Prostate ca screening: DRE , PSA Colon ca screening: recall 01/2019?  An After Visit Summary was printed and given to the patient.  FOLLOW UP:  No  follow-ups on file.  Signed:  Crissie Sickles, MD           11/04/2019

## 2019-11-10 ENCOUNTER — Other Ambulatory Visit: Payer: Self-pay

## 2019-11-10 MED ORDER — SITAGLIPTIN PHOSPHATE 100 MG PO TABS: 100.0000 mg | ORAL_TABLET | Freq: Every day | ORAL | 0 refills | Status: DC

## 2019-11-10 MED ORDER — METOPROLOL TARTRATE 50 MG PO TABS: 50.0000 mg | ORAL_TABLET | Freq: Two times a day (BID) | ORAL | 0 refills | Status: DC

## 2019-11-21 ENCOUNTER — Ambulatory Visit (INDEPENDENT_AMBULATORY_CARE_PROVIDER_SITE_OTHER): Payer: Medicare HMO | Admitting: Family Medicine

## 2019-11-21 ENCOUNTER — Other Ambulatory Visit: Payer: Self-pay

## 2019-11-21 ENCOUNTER — Encounter: Payer: Self-pay | Admitting: Family Medicine

## 2019-11-21 VITALS — BP 113/75 | HR 75 | Temp 98.3°F | Resp 16 | Ht 73.5 in | Wt 225.8 lb

## 2019-11-21 DIAGNOSIS — R972 Elevated prostate specific antigen [PSA]: Secondary | ICD-10-CM

## 2019-11-21 DIAGNOSIS — Z0000 Encounter for general adult medical examination without abnormal findings: Secondary | ICD-10-CM

## 2019-11-21 DIAGNOSIS — Z23 Encounter for immunization: Secondary | ICD-10-CM

## 2019-11-21 DIAGNOSIS — Z125 Encounter for screening for malignant neoplasm of prostate: Secondary | ICD-10-CM | POA: Diagnosis not present

## 2019-11-21 DIAGNOSIS — I2510 Atherosclerotic heart disease of native coronary artery without angina pectoris: Secondary | ICD-10-CM | POA: Diagnosis not present

## 2019-11-21 DIAGNOSIS — E1140 Type 2 diabetes mellitus with diabetic neuropathy, unspecified: Secondary | ICD-10-CM | POA: Diagnosis not present

## 2019-11-21 LAB — CBC WITH DIFFERENTIAL/PLATELET
Basophils Absolute: 0.1 10*3/uL (ref 0.0–0.1)
Basophils Relative: 0.6 % (ref 0.0–3.0)
Eosinophils Absolute: 0.3 10*3/uL (ref 0.0–0.7)
Eosinophils Relative: 2.9 % (ref 0.0–5.0)
HCT: 45.4 % (ref 39.0–52.0)
Hemoglobin: 15 g/dL (ref 13.0–17.0)
Lymphocytes Relative: 29.6 % (ref 12.0–46.0)
Lymphs Abs: 2.8 10*3/uL (ref 0.7–4.0)
MCHC: 33 g/dL (ref 30.0–36.0)
MCV: 93.7 fl (ref 78.0–100.0)
Monocytes Absolute: 0.8 10*3/uL (ref 0.1–1.0)
Monocytes Relative: 8.5 % (ref 3.0–12.0)
Neutro Abs: 5.6 10*3/uL (ref 1.4–7.7)
Neutrophils Relative %: 58.4 % (ref 43.0–77.0)
Platelets: 257 10*3/uL (ref 150.0–400.0)
RBC: 4.84 Mil/uL (ref 4.22–5.81)
RDW: 14 % (ref 11.5–15.5)
WBC: 9.5 10*3/uL (ref 4.0–10.5)

## 2019-11-21 LAB — PSA: PSA: 1.5 ng/mL (ref 0.10–4.00)

## 2019-11-21 LAB — HEMOGLOBIN A1C: Hgb A1c MFr Bld: 7.1 % — ABNORMAL HIGH (ref 4.6–6.5)

## 2019-11-21 LAB — TSH: TSH: 2.08 u[IU]/mL (ref 0.35–4.50)

## 2019-11-21 MED ORDER — LORAZEPAM 0.5 MG PO TABS: ORAL_TABLET | ORAL | 0 refills | Status: DC

## 2019-11-21 MED ORDER — GABAPENTIN 300 MG PO CAPS: 300.0000 mg | ORAL_CAPSULE | Freq: Every day | ORAL | 3 refills | Status: DC

## 2019-11-21 MED ORDER — METOPROLOL TARTRATE 50 MG PO TABS: 50.0000 mg | ORAL_TABLET | Freq: Two times a day (BID) | ORAL | 0 refills | Status: DC

## 2019-11-21 MED ORDER — SITAGLIPTIN PHOSPHATE 100 MG PO TABS: 100.0000 mg | ORAL_TABLET | Freq: Every day | ORAL | 0 refills | Status: DC

## 2019-11-21 NOTE — Patient Instructions (Addendum)
[facility] to schedule repeat colonoscopy as per Dr. Vena Rua recommendation. Phone: [phone].   Health Maintenance, Male Adopting a healthy lifestyle and getting preventive care are important in promoting health and wellness. Ask your health care provider about:  The right schedule for you to have regular tests and exams.  Things you can do on your own to prevent diseases and keep yourself healthy. What should I know about diet, weight, and exercise? Eat a healthy diet   Eat a diet that includes plenty of vegetables, fruits, low-fat dairy products, and lean protein.  Do not eat a lot of foods that are high in solid fats, added sugars, or sodium. Maintain a healthy weight Body mass index (BMI) is a measurement that can be used to identify possible weight problems. It estimates body fat based on height and weight. Your health care provider can help determine your BMI and help you achieve or maintain a healthy weight. Get regular exercise Get regular exercise. This is one of the most important things you can do for your health. Most adults should:  Exercise for at least 150 minutes each week. The exercise should increase your heart rate and make you sweat (moderate-intensity exercise).  Do strengthening exercises at least twice a week. This is in addition to the moderate-intensity exercise.  Spend less time sitting. Even light physical activity can be beneficial. Watch cholesterol and blood lipids Have your blood tested for lipids and cholesterol at 66 years of age, then have this test every 5 years. You may need to have your cholesterol levels checked more often if:  Your lipid or cholesterol levels are high.  You are older than 66 years of age.  You are at high risk for heart disease. What should I know about cancer screening? Many types of cancers can be detected early and may often be prevented. Depending on your health history and family history, you  may need to have cancer screening at various ages. This may include screening for:  Colorectal cancer.  Prostate cancer.  Skin cancer.  Lung cancer. What should I know about heart disease, diabetes, and high blood pressure? Blood pressure and heart disease  High blood pressure causes heart disease and increases the risk of stroke. This is more likely to develop in people who have high blood pressure readings, are of African descent, or are overweight.  Talk with your health care provider about your target blood pressure readings.  Have your blood pressure checked: ? Every 3-5 years if you are 39-89 years of age. ? Every year if you are 47 years old or older.  If you are between the ages of 64 and 12 and are a current or former smoker, ask your health care provider if you should have a one-time screening for abdominal aortic aneurysm (AAA). Diabetes Have regular diabetes screenings. This checks your fasting blood sugar level. Have the screening done:  Once every three years after age 98 if you are at a normal weight and have a low risk for diabetes.  More often and at a younger age if you are overweight or have a high risk for diabetes. What should I know about preventing infection? Hepatitis B If you have a higher risk for hepatitis B, you should be screened for this virus. Talk with your health care provider to find out if you are at risk for hepatitis B infection. Hepatitis C Blood testing is recommended for:  Everyone born from [birth year] through 1967.  Anyone with known  risk factors for hepatitis C. Sexually transmitted infections (STIs)  You should be screened each year for STIs, including gonorrhea and chlamydia, if: ? You are sexually active and are younger than 66 years of age. ? You are older than 66 years of age and your health care provider tells you that you are at risk for this type of infection. ? Your sexual activity has changed since you were last screened, and you  are at increased risk for chlamydia or gonorrhea. Ask your health care provider if you are at risk.  Ask your health care provider about whether you are at high risk for HIV. Your health care provider may recommend a prescription medicine to help prevent HIV infection. If you choose to take medicine to prevent HIV, you should first get tested for HIV. You should then be tested every 3 months for as long as you are taking the medicine. Follow these instructions at home: Lifestyle  Do not use any products that contain nicotine or tobacco, such as cigarettes, e-cigarettes, and chewing tobacco. If you need help quitting, ask your health care provider.  Do not use street drugs.  Do not share needles.  Ask your health care provider for help if you need support or information about quitting drugs. Alcohol use  Do not drink alcohol if your health care provider tells you not to drink.  If you drink alcohol: ? Limit how much you have to 0-2 drinks a day. ? Be aware of how much alcohol is in your drink. In the [place] one drink equals one 12 oz bottle of beer (355 mL), one 5 oz glass of wine (148 mL), or one 1 oz glass of hard liquor (44 mL). General instructions  Schedule regular health, dental, and eye exams.  Stay current with your vaccines.  Tell your health care provider if: ? You often feel depressed. ? You have ever been abused or do not feel safe at home. Summary  Adopting a healthy lifestyle and getting preventive care are important in promoting health and wellness.  Follow your health care provider's instructions about healthy diet, exercising, and getting tested or screened for diseases.  Follow your health care provider's instructions on monitoring your cholesterol and blood pressure. This information is not intended to replace advice given to you by your health care provider. Make sure you discuss any questions you have with your health care provider. Document Revised:  07/28/2018 Document Reviewed: 07/28/2018 Elsevier Patient Education  2020 [employer].

## 2019-11-21 NOTE — Progress Notes (Signed)
Office Note 11/21/2019  CC:  Chief Complaint  Patient presents with  . Annual Exam    pt is fasting    HPI:  Alex Yang is a 64 y.o. White male who is here for annual health maintenance exam. Saw cardiology for annual f/u 10/19/19, all was stable, CMET and FLP good.  Exercise: walking regularly.   Diet: fairly good diabetic diet.   Eyes; [employer] [place], pt thinks 2019 or 2020.    Past Medical History:  Diagnosis Date  . Anxiety   . Colon cancer screening 02/2017   Pt declined colonoscopy, but agreed to cologuard screening--this test was POSITIVE on 03/11/17---f/u colonoscopy 01/28/18 w/adenomatous polyps-->recall 1 yr.  . Coronary artery disease    CABG 2014.  . 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  . Dupuytren's disease 09/2017   ortho 09/2017, L hand  . Erectile dysfunction    Dr. Manny (urol is also doing prostate ca screening).  Refractory to medications.  Pt likely to get penile implant as of urol f/u 01/2015.  . Gross hematuria 2017   Pt is to bring this up with his urologist as of 06/2016.  . Hyperlipidemia   . Hypertension   . Increased prostate specific antigen (PSA) velocity 02/2018   PSA 1.7 to 2.7 from 02/2017 to 02/2018.  Repeat PSA 09/2018 was 3-->referral to urology done.  . NSTEMI (non-ST elevated myocardial infarction) (HCC) 07/2013   CABG x 22 Jul 2013  . Obesity, Class I, BMI 30-34.9   . Paroxysmal atrial fibrillation (HCC) 07/2013   Started post-op CABG, converted on amiodarone--amiodarone stopped after a couple months  . Stenosing tenosynovitis of thumb 2019   left; incarcerated in extension (ortho visit 09/2017--steroid injection).    Past Surgical History:  Procedure Laterality Date  . COLONOSCOPY W/ POLYPECTOMY  01/28/2018   Mild diverticulosis, small int hem.  Adenomatous polyp: recall 1 yr per GI  . CORONARY ARTERY BYPASS GRAFT N/A 08/02/2013   Procedure: CORONARY ARTERY BYPASS GRAFTING  (CABG);  Surgeon: Edward B Gerhardt, MD;  Location: [facility];  Service: Open Heart Surgery;  Laterality: N/A;  Coronary artery bypass graft times five utilizing the left internal mammary artery and the right greater saphenous vein.  . ENDOVEIN HARVEST OF GREATER SAPHENOUS VEIN Right 08/02/2013   Procedure: ENDOVEIN HARVEST OF GREATER SAPHENOUS VEIN;  Surgeon: Edward B Gerhardt, MD;  Location: [facility];  Service: Open Heart Surgery;  Laterality: Right;  . INTRAOPERATIVE TRANSESOPHAGEAL ECHOCARDIOGRAM N/A 08/02/2013   Procedure: INTRAOPERATIVE TRANSESOPHAGEAL ECHOCARDIOGRAM;  Surgeon: Edward B Gerhardt, MD;  Location: [facility];  Service: Open Heart Surgery;  Laterality: N/A;.  EF 40%, LVH and LV dilatation, inf wall hypokinesis (at the time of his MI)  . LEFT HEART CATHETERIZATION WITH CORONARY ANGIOGRAM N/A 08/01/2013   Procedure: LEFT HEART CATHETERIZATION WITH CORONARY ANGIOGRAM;  Surgeon: Michael D Cooper, MD;  Location: [facility];  Service: Cardiovascular;  Laterality: N/A;  . NASAL FRACTURE SURGERY  1979  . SHOULDER SURGERY  1983   left; pins are in place  . TRANSTHORACIC ECHOCARDIOGRAM  07/2013   EF 40%+ wall motion abnl--in the context of acute NSTEMI    Family History  Problem Relation Age of Onset  . Heart attack Mother   . Heart disease Mother   . Diabetes Mother   . Heart disease Father   . Cancer Brother        lung and liver  . Colon cancer Neg Hx   .   Rectal cancer Neg Hx     Social History   Socioeconomic History  . Marital status: Married    Spouse name: Not on file  . Number of children: Not on file  . Years of education: Not on file  . Highest education level: Not on file  Occupational History  . Occupation: Process Server  Tobacco Use  . Smoking status: Never Smoker  . Smokeless tobacco: Never Used  Substance and Sexual Activity  . Alcohol use: No    Alcohol/week: 0.0 standard drinks  . Drug use: No  . Sexual activity: Not on file  Other Topics Concern  . Not on  file  Social History Narrative   Married.  Separated 2020.   Has 3 biologic children.   Occupation:  Hairdresser (self employed).   Education: HS   No tobacco, alc, or drugs   Social Determinants of Health   Financial Resource Strain:   . Difficulty of Paying Living Expenses:   Food Insecurity:   . Worried About Running Out of Food in the Last Year:   . Ran Out of Food in the Last Year:   Transportation Needs:   . Lack of Transportation (Medical):   . Lack of Transportation (Non-Medical):   Physical Activity:   . Days of Exercise per Week:   . Minutes of Exercise per Session:   Stress:   . Feeling of Stress :   Social Connections:   . Frequency of Communication with Friends and Family:   . Frequency of Social Gatherings with Friends and Family:   . Attends Religious Services:   . Active Member of Clubs or Organizations:   . Attends Club or Organization Meetings:   . Marital Status:   Intimate Partner Violence:   . Fear of Current or Ex-Partner:   . Emotionally Abused:   . Physically Abused:   . Sexually Abused:     Outpatient Medications Prior to Visit  Medication Sig Dispense Refill  . ACCU-CHEK SOFTCLIX LANCETS lancets Use as instructed 100 each 12  . albuterol (PROVENTIL HFA;VENTOLIN HFA) 108 (90 Base) MCG/ACT inhaler Inhale 2 puffs into the lungs every 4 (four) hours as needed for wheezing or shortness of breath. 1 Inhaler 1  . Alcohol Swabs (B-D SINGLE USE SWABS REGULAR) PADS USE TO CHECK BLOOD SUGAR THREE TIMES DAILY 300 each 1  . atorvastatin (LIPITOR) 20 MG tablet TAKE 1 TABLET DAILY AT 6 PM. 14 tablet 0  . Blood Glucose Calibration (ACCU-CHEK AVIVA) SOLN 1 Bottle by In Vitro route as needed. 1 each 0  . Blood Glucose Monitoring Suppl (ACCU-CHEK AVIVA PLUS) w/Device KIT     . citalopram (CELEXA) 20 MG tablet TAKE 1 TABLET EVERY DAY 90 tablet 1  . clopidogrel (PLAVIX) 75 MG tablet Take 1 tablet (75 mg total) by mouth daily with breakfast. Please keep upcoming appt  with Dr. Nahser in March for future refills. Thank you 90 tablet 0  . gabapentin (NEURONTIN) 300 MG capsule TAKE 1 CAPSULE AT BEDTIME 90 capsule 3  . glipiZIDE (GLUCOTROL XL) 10 MG 24 hr tablet Take 1 tablet (10 mg total) by mouth daily. 90 tablet 0  . glucose blood test strip Use to check blood sugar 3 times daily 300 each 3  . LANCETS ULTRA THIN 30G MISC 1 Units by Does not apply route 4 (four) times daily. Breakfast, Lunch, Dinner, Bedtime 100 each 3  . LORazepam (ATIVAN) 0.5 MG tablet 1-2 tabs po q8h prn anxiety 30 tablet 5  .   losartan (COZAAR) 100 MG tablet Take 1 tablet (100 mg total) by mouth daily. TAKE 1 TABLET BY MOUTH EVERY DAY 90 tablet 1  . metFORMIN (GLUCOPHAGE) 1000 MG tablet Take 1 tablet (1,000 mg total) by mouth 2 (two) times daily with a meal. 28 tablet 0  . metoprolol tartrate (LOPRESSOR) 50 MG tablet Take 1 tablet (50 mg total) by mouth 2 (two) times daily. 60 tablet 0  . sitaGLIPtin (JANUVIA) 100 MG tablet Take 1 tablet (100 mg total) by mouth daily. 30 tablet 0  . traZODone (DESYREL) 50 MG tablet TAKE 1 TO 2 TABLETS AT BEDTIME AS NEEDED FOR INSOMNIA 180 tablet 3  . glucose blood test strip Use as instructed 100 each 12   No facility-administered medications prior to visit.    Allergies  Allergen Reactions  . Oxycodone Other (See Comments)    Psych/mental adverse effects  . Pioglitazone Swelling and Rash    ROS Review of Systems  Constitutional: Negative for appetite change, chills, fatigue and fever.  HENT: Negative for congestion, dental problem, ear pain and sore throat.   Eyes: Negative for discharge, redness and visual disturbance.  Respiratory: Negative for cough, chest tightness, shortness of breath and wheezing.   Cardiovascular: Negative for chest pain, palpitations and leg swelling.  Gastrointestinal: Negative for abdominal pain, blood in stool, diarrhea, nausea and vomiting.  Genitourinary: Negative for difficulty urinating, dysuria, flank pain,  frequency, hematuria and urgency.  Musculoskeletal: Negative for arthralgias, back pain, joint swelling, myalgias and neck stiffness.  Skin: Negative for pallor and rash.  Neurological: Negative for dizziness, speech difficulty, weakness and headaches.       R LL intermittent tingling/tightness feeling since being off gabapentin.  No edema or swelling  Hematological: Negative for adenopathy. Does not bruise/bleed easily.  Psychiatric/Behavioral: Negative for confusion and sleep disturbance. The patient is not nervous/anxious.     PE; Blood pressure 113/75, pulse 75, temperature 98.3 F (36.8 C), temperature source Temporal, resp. rate 16, height 6' 1.5" (1.867 m), weight 225 lb 12.8 oz (102.4 kg), SpO2 96 %. Body mass index is 29.39 kg/m.  Gen: Alert, well appearing.  Patient is oriented to person, place, time, and situation. AFFECT: pleasant, lucid thought and speech. ENT: Ears: EACs clear, normal epithelium.  TMs with good light reflex and landmarks bilaterally.  Eyes: no injection, icteris, swelling, or exudate.  EOMI, PERRLA. Nose: no drainage or turbinate edema/swelling.  No injection or focal lesion.  Mouth: lips without lesion/swelling.  Oral mucosa pink and moist.  Dentition intact and without obvious caries or gingival swelling.  Oropharynx without erythema, exudate, or swelling.  Neck: supple/nontender.  No LAD, mass, or TM.  Carotid pulses 2+ bilaterally, without bruits. CV: RRR, no m/r/g.   LUNGS: CTA bilat, nonlabored resps, good aeration in all lung fields. ABD: soft, NT, ND, BS normal.  No hepatospenomegaly or mass.  No bruits. EXT: no clubbing, cyanosis, or edema.  Musculoskeletal: no joint swelling, erythema, warmth, or tenderness.  ROM of all joints intact. R LL calf circ 10 cm below inf border of patella was 40 cm Left was 39 cm.  Hx of R LL hematoma->has been a bit larger since then. Skin - no sores or suspicious lesions or rashes or color changes   Pertinent labs:   Lab Results  Component Value Date   TSH 2.42 02/27/2017   Lab Results  Component Value Date   WBC 9.7 04/28/2018   HGB 13.5 04/28/2018   HCT 40.6 04/28/2018   MCV   91.8 04/28/2018   PLT 292.0 04/28/2018   Lab Results  Component Value Date   CREATININE 1.17 10/19/2019   BUN 23 10/19/2019   NA 141 10/19/2019   K 4.4 10/19/2019   CL 103 10/19/2019   CO2 23 10/19/2019   Lab Results  Component Value Date   ALT 33 10/19/2019   AST 26 10/19/2019   ALKPHOS 85 10/19/2019   BILITOT 0.4 10/19/2019   Lab Results  Component Value Date   CHOL 103 10/19/2019   Lab Results  Component Value Date   HDL 50 10/19/2019   Lab Results  Component Value Date   LDLCALC 37 10/19/2019   Lab Results  Component Value Date   TRIG 79 10/19/2019   Lab Results  Component Value Date   CHOLHDL 2.1 10/19/2019   Lab Results  Component Value Date   PSA 3.04 09/24/2018   PSA 2.77 03/10/2018   PSA 1.71 02/27/2017   Lab Results  Component Value Date   HGBA1C 6.9 (H) 05/09/2019     ASSESSMENT AND PLAN:   Health maintenance exam: Reviewed age and gender appropriate health maintenance issues (prudent diet, regular exercise, health risks of tobacco and excessive alcohol, use of seatbelts, fire alarms in home, use of sunscreen).  Also reviewed age and gender appropriate health screening as well as vaccine recommendations. Vaccines: due for pneumovax 23 (5 yrs since last one and he recently turned 63)--given today.  He had covid #1 10/27/19. Labs: A1c, TSH, CBC, PSA. Prostate ca screening: Hx of inc PSA velocity->referred to urology 09/2018-->he did not go.  Will repeat PSA today.  DRE today normal. Colon ca screening:  Was supposed to have repeat colonoscopy around 01/2019-->he did not. I gave him GI MD contact number so he can get this arranged. Will call his eye care provider to get date of last diab retpthy eye exam.  R LL intermittent sensory changes, neuropathy, sx's returned when he got  off gabapentin. Restart gabapentin.  Low suspicion of DVT at this time.  Anxiety: takes lorazepam rarely, last filled 04/2019. I gave another 30 of these with no RF today.  An After Visit Summary was printed and given to the patient.  FOLLOW UP:  Return in about 3 months (around 02/20/2020) for routine chronic illness f/u.  Signed:  Phil McGowen, MD           11/21/2019  

## 2019-11-24 ENCOUNTER — Other Ambulatory Visit: Payer: Self-pay | Admitting: Family Medicine

## 2019-12-19 ENCOUNTER — Other Ambulatory Visit: Payer: Self-pay | Admitting: Cardiovascular Disease

## 2019-12-24 ENCOUNTER — Other Ambulatory Visit: Payer: Self-pay | Admitting: Family Medicine

## 2019-12-30 ENCOUNTER — Other Ambulatory Visit: Payer: Self-pay | Admitting: Family Medicine

## 2019-12-30 ENCOUNTER — Other Ambulatory Visit: Payer: Self-pay | Admitting: Cardiovascular Disease

## 2020-01-12 ENCOUNTER — Other Ambulatory Visit: Payer: Self-pay | Admitting: Family Medicine

## 2020-02-07 ENCOUNTER — Other Ambulatory Visit: Payer: Self-pay | Admitting: Family Medicine

## 2020-02-08 ENCOUNTER — Other Ambulatory Visit: Payer: Self-pay | Admitting: Cardiovascular Disease

## 2020-02-21 ENCOUNTER — Other Ambulatory Visit: Payer: Self-pay

## 2020-02-21 ENCOUNTER — Encounter: Payer: Self-pay | Admitting: Family Medicine

## 2020-02-21 ENCOUNTER — Ambulatory Visit (INDEPENDENT_AMBULATORY_CARE_PROVIDER_SITE_OTHER): Payer: Medicare HMO | Admitting: Family Medicine

## 2020-02-21 VITALS — BP 122/76 | HR 81 | Temp 97.8°F | Resp 16 | Ht 73.5 in | Wt 230.4 lb

## 2020-02-21 DIAGNOSIS — E1140 Type 2 diabetes mellitus with diabetic neuropathy, unspecified: Secondary | ICD-10-CM

## 2020-02-21 DIAGNOSIS — E7800 Pure hypercholesterolemia, unspecified: Secondary | ICD-10-CM | POA: Diagnosis not present

## 2020-02-21 DIAGNOSIS — I10 Essential (primary) hypertension: Secondary | ICD-10-CM

## 2020-02-21 LAB — BASIC METABOLIC PANEL WITH GFR
BUN: 20 mg/dL (ref 6–23)
CO2: 24 meq/L (ref 19–32)
Calcium: 9.1 mg/dL (ref 8.4–10.5)
Chloride: 106 meq/L (ref 96–112)
Creatinine, Ser: 1.04 mg/dL (ref 0.40–1.50)
GFR: 71.54 mL/min (ref >60.00)
Glucose, Bld: 144 mg/dL — ABNORMAL HIGH (ref 70–99)
Potassium: 4.8 meq/L (ref 3.5–5.1)
Sodium: 139 meq/L (ref 135–145)

## 2020-02-21 LAB — HEMOGLOBIN A1C: Hgb A1c MFr Bld: 7.4 % — ABNORMAL HIGH (ref 4.6–6.5)
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
## 2020-02-23 ENCOUNTER — Other Ambulatory Visit: Payer: Self-pay | Admitting: Family Medicine

## 2020-02-23 MED ORDER — VICTOZA 18 MG/3ML ~~LOC~~ SOPN: PEN_INJECTOR | SUBCUTANEOUS | 1 refills | Status: DC

## 2020-02-24 ENCOUNTER — Telehealth: Payer: Self-pay

## 2020-02-24 NOTE — Telephone Encounter (Signed)
[facility] reached out and needs clarification on pt Victoza. Asking if dose should be mg vs mL.  Please clarify

## 2020-02-24 NOTE — Telephone Encounter (Signed)
I called pharmacy to clarify the victoza rx: should be mg, not ml. Signed:  Crissie Sickles, MD           02/24/2020

## 2020-02-27 ENCOUNTER — Other Ambulatory Visit: Payer: Self-pay

## 2020-02-27 MED ORDER — INSULIN PEN NEEDLE 31G X 8 MM MISC: 2 refills | Status: DC

## 2020-02-27 NOTE — Telephone Encounter (Signed)
Insulin pen needles, 31x 52mm sent in to pharmacy. Confirmed with pharmacy could be used with Victoza, patient was notified Rx would be available.

## 2020-02-27 NOTE — Telephone Encounter (Signed)
Patient states that he needs syringes for his victoza  Please call [phone]

## 2020-03-05 ENCOUNTER — Other Ambulatory Visit: Payer: Self-pay | Admitting: Family Medicine

## 2020-03-06 ENCOUNTER — Other Ambulatory Visit: Payer: Self-pay

## 2020-03-06 MED ORDER — SITAGLIPTIN PHOSPHATE 100 MG PO TABS: 100.0000 mg | ORAL_TABLET | Freq: Every day | ORAL | 2 refills | Status: DC

## 2020-03-10 ENCOUNTER — Other Ambulatory Visit: Payer: Self-pay | Admitting: Family Medicine

## 2020-03-22 ENCOUNTER — Encounter: Payer: Self-pay | Admitting: Family Medicine

## 2020-03-22 ENCOUNTER — Ambulatory Visit: Payer: Medicare HMO | Admitting: Family Medicine

## 2020-03-22 ENCOUNTER — Other Ambulatory Visit: Payer: Self-pay

## 2020-03-22 VITALS — BP 109/75 | HR 78 | Temp 97.6°F | Resp 16 | Ht 73.5 in | Wt 216.0 lb

## 2020-03-22 DIAGNOSIS — E1149 Type 2 diabetes mellitus with other diabetic neurological complication: Secondary | ICD-10-CM | POA: Diagnosis not present

## 2020-03-22 MED ORDER — VICTOZA 18 MG/3ML ~~LOC~~ SOPN: PEN_INJECTOR | SUBCUTANEOUS | 6 refills | Status: DC

## 2020-03-22 MED ORDER — LORAZEPAM 0.5 MG PO TABS: ORAL_TABLET | ORAL | 2 refills | Status: DC

## 2020-03-22 MED ORDER — METFORMIN HCL 1000 MG PO TABS: 1000.0000 mg | ORAL_TABLET | Freq: Two times a day (BID) | ORAL | 3 refills | Status: DC

## 2020-03-22 NOTE — Patient Instructions (Signed)
Continue taking metformin and victoza. You will no longer be taking [place].

## 2020-03-22 NOTE — Progress Notes (Signed)
OFFICE VISIT  03/22/2020   CC:  Chief Complaint  Patient presents with  . Follow-up    dm, would like to discuss victoza   HPI:    Patient is a 64 y.o. Caucasian male who presents for 1 mo f/u DM 2. A1c 7.4% last visit, stopped glipizide and started victoza, kept him on januvia and metformin.  Lytes/cr normal at that time.  INTERIM HX: Feeling well. Walking more and cut out "all sugar". No home gluc monitoring still b/c glucom w/out battery. Tolerating victoza well. He just ran out of [place] and metformin.    Past Medical History:  Diagnosis Date  . Anxiety   . Colon cancer screening 02/2017   Pt declined colonoscopy, but agreed to cologuard screening--this test was POSITIVE on 03/11/17---f/u colonoscopy 01/28/18 w/adenomatous polyps-->recall 1 yr.  . Coronary artery disease    CABG 2014.  . Diabetes mellitus type 2 with complications ([place]) 51/7001   DPN and microalbuminuria.  . Dupuytren's disease 09/2017   ortho 09/2017, L hand  . Erectile dysfunction    Dr. Tresa Moore (urol is also doing prostate ca screening).  Refractory to medications.  Pt likely to get penile implant as of urol f/u 01/2015.  Johney Maine hematuria 2017   Pt is to bring this up with his urologist as of 06/2016.  Marland Kitchen Hyperlipidemia   . Hypertension   . Increased prostate specific antigen (PSA) velocity 02/2018   PSA 1.7 to 2.7 from 02/2017 to 02/2018.  Repeat PSA 09/2018 was 3-->referral to urology done, pt did not go. Rpt PSA 11/2019 was 1.5. Holding off on urol ref..  . NSTEMI (non-ST elevated myocardial infarction) [facility]) 07/2013   CABG x 22 Jul 2013  . Obesity, Class I, BMI 30-34.9   . Paroxysmal atrial fibrillation ([place]) 07/2013   Started post-op CABG, converted on amiodarone--amiodarone stopped after a couple months  . Stenosing tenosynovitis of thumb 2019   left; incarcerated in extension (ortho visit 09/2017--steroid injection).    Past Surgical History:  Procedure Laterality Date  . COLONOSCOPY W/  POLYPECTOMY  01/28/2018   Mild diverticulosis, small int hem.  Adenomatous polyp: recall 1 yr per GI  . CORONARY ARTERY BYPASS GRAFT N/A 08/02/2013   Procedure: CORONARY ARTERY BYPASS GRAFTING (CABG);  Surgeon: Grace Isaac, MD;  Location: [place];  Service: Open Heart Surgery;  Laterality: N/A;  Coronary artery bypass graft times five utilizing the left internal mammary artery and the right greater saphenous vein.  Marland Kitchen ENDOVEIN HARVEST OF GREATER SAPHENOUS VEIN Right 08/02/2013   Procedure: ENDOVEIN HARVEST OF GREATER SAPHENOUS VEIN;  Surgeon: Grace Isaac, MD;  Location: [place];  Service: Open Heart Surgery;  Laterality: Right;  . INTRAOPERATIVE TRANSESOPHAGEAL ECHOCARDIOGRAM N/A 08/02/2013   Procedure: INTRAOPERATIVE TRANSESOPHAGEAL ECHOCARDIOGRAM;  Surgeon: Grace Isaac, MD;  Location: [place];  Service: Open Heart Surgery;  Laterality: N/A;.  EF 40%, LVH and LV dilatation, inf wall hypokinesis (at the time of his MI)  . LEFT HEART CATHETERIZATION WITH CORONARY ANGIOGRAM N/A 08/01/2013   Procedure: LEFT HEART CATHETERIZATION WITH CORONARY ANGIOGRAM;  Surgeon: Blane Ohara, MD;  Location: [facility] CATH LAB;  Service: Cardiovascular;  Laterality: N/A;  . NASAL FRACTURE SURGERY  1979  . SHOULDER SURGERY  1983   left; pins are in place  . TRANSTHORACIC ECHOCARDIOGRAM  07/2013   EF 40%+ wall motion abnl--in the context of acute NSTEMI    Outpatient Medications Prior to Visit  Medication Sig Dispense Refill  . ACCU-CHEK AVIVA PLUS  test strip USE  TO CHECK BLOOD SUGAR THREE TIMES DAILY 300 strip 1  . ACCU-CHEK SOFTCLIX LANCETS lancets Use as instructed 100 each 12  . albuterol (PROVENTIL HFA;VENTOLIN HFA) 108 (90 Base) MCG/ACT inhaler Inhale 2 puffs into the lungs every 4 (four) hours as needed for wheezing or shortness of breath. 1 Inhaler 1  . Alcohol Swabs (B-D SINGLE USE SWABS REGULAR) PADS USE TO CHECK BLOOD SUGAR THREE TIMES DAILY 300 each 1  . atorvastatin (LIPITOR) 20 MG tablet TAKE  1 TABLET DAILY AT 6 PM. 90 tablet 0  . Blood Glucose Calibration (ACCU-CHEK AVIVA) SOLN 1 Bottle by In Vitro route as needed. 1 each 0  . Blood Glucose Monitoring Suppl (ACCU-CHEK AVIVA PLUS) w/Device KIT     . citalopram (CELEXA) 20 MG tablet TAKE 1 TABLET EVERY DAY 90 tablet 0  . clopidogrel (PLAVIX) 75 MG tablet TAKE 1 TABLET DAILY WITH BREAKFAST. (PLEASE KEEP UPCOMING APPOINTMENT WITH DR. Acie Fredrickson IN [facility] FOR FUTURE REFILLS) 90 tablet 3  . gabapentin (NEURONTIN) 300 MG capsule TAKE 1 CAPSULE AT BEDTIME 90 capsule 0  . Insulin Pen Needle 31G X 8 MM MISC Use to inject Victoza SQ. 100 each 2  . LANCETS ULTRA THIN 30G MISC 1 Units by Does not apply route 4 (four) times daily. Breakfast, Lunch, Dinner, Bedtime 100 each 3  . losartan (COZAAR) 100 MG tablet Take 1 tablet (100 mg total) by mouth daily. TAKE 1 TABLET BY MOUTH EVERY DAY 90 tablet 1  . metoprolol tartrate (LOPRESSOR) 50 MG tablet TAKE 1 TABLET BY MOUTH TWICE A DAY 180 tablet 0  . traZODone (DESYREL) 50 MG tablet TAKE 1 TO 2 TABLETS AT BEDTIME AS NEEDED FOR INSOMNIA 180 tablet 3  . liraglutide (VICTOZA) 18 MG/3ML SOPN 0.62m SQ qd x 7d, then increase to 1.2 ml SQ qd 5 pen 1  . LORazepam (ATIVAN) 0.5 MG tablet 1-2 tabs po q8h prn anxiety 30 tablet 0  . metFORMIN (GLUCOPHAGE) 1000 MG tablet TAKE 1 TABLET TWICE DAILY WITH MEALS 180 tablet 0  . sitaGLIPtin (JANUVIA) 100 MG tablet Take 1 tablet (100 mg total) by mouth daily. (Patient not taking: Reported on 03/22/2020) 30 tablet 2   No facility-administered medications prior to visit.    Allergies  Allergen Reactions  . Oxycodone Other (See Comments)    Psych/mental adverse effects  . Pioglitazone Swelling and Rash    ROS As per HPI  PE: Vitals with BMI 03/22/2020 02/21/2020 11/21/2019  Height 6' 1.5" 6' 1.5" 6' 1.5"  Weight 216 lbs 230 lbs 6 oz 225 lbs 13 oz  BMI 28.11 253.64268.03 Systolic [id number] Diastolic 75 76 75  Pulse 78 81 75  O2 sat 98% on RA today.  Gen: Alert, well  appearing.  Patient is oriented to person, place, time, and situation. AFFECT: pleasant, lucid thought and speech. No further exam today.  LABS:    Chemistry      Component Value Date/Time   NA 139 02/21/2020 1129   NA 141 10/19/2019 1009   K 4.8 02/21/2020 1129   CL 106 02/21/2020 1129   CO2 24 02/21/2020 1129   BUN 20 02/21/2020 1129   BUN 23 10/19/2019 1009   CREATININE 1.04 02/21/2020 1129      Component Value Date/Time   CALCIUM 9.1 02/21/2020 1129   ALKPHOS 85 10/19/2019 1009   AST 26 10/19/2019 1009   ALT 33 10/19/2019 1009   BILITOT 0.4 10/19/2019 1009  Lab Results  Component Value Date   HGBA1C 7.4 (H) 02/21/2020    IMPRESSION AND PLAN:  DM 2, tolerating recent start of victoza, wt is down 14 lbs over the last month!. Diet and activity level have improved. Will continue victoza and metformin but d/c [place] today. Next a1c and RCI f/u 2 mo.  An After Visit Summary was printed and given to the patient.  FOLLOW UP: Return in about 2 months (around 05/22/2020) for routine chronic illness f/u.  Signed:  Crissie Sickles, MD           03/22/2020

## 2020-03-26 ENCOUNTER — Other Ambulatory Visit: Payer: Self-pay | Admitting: Family Medicine

## 2020-03-26 ENCOUNTER — Other Ambulatory Visit: Payer: Self-pay

## 2020-03-26 MED ORDER — VICTOZA 18 MG/3ML ~~LOC~~ SOPN: PEN_INJECTOR | SUBCUTANEOUS | 6 refills | Status: DC

## 2020-03-26 NOTE — Telephone Encounter (Signed)
Last Rx for Victoza sent 03/22/20 (67ml, 6), fax received stating dose must be specified in mg instead of ml.  Please advise, thanks. Medication pending

## 2020-03-28 ENCOUNTER — Telehealth: Payer: Self-pay | Admitting: Family Medicine

## 2020-03-28 NOTE — Telephone Encounter (Signed)
[employer] is calling in asking for clarification on his liraglutide (VICTOZA) 18 MG/3ML SOPN, states they have two with two different directions.

## 2020-03-28 NOTE — Telephone Encounter (Signed)
Spoke with Rosann Auerbach, pharmacist with [facility]. She was able to delete Rx sent on 8/5 only keeping most recent Rx sent on 03/26/20 for 21mL with 6 refills. They can only fill 90 days worth at a time. Changed Rx to 15mL with 4 refills.

## 2020-04-02 ENCOUNTER — Other Ambulatory Visit: Payer: Self-pay

## 2020-04-03 ENCOUNTER — Other Ambulatory Visit: Payer: Self-pay | Admitting: Family Medicine

## 2020-04-10 ENCOUNTER — Other Ambulatory Visit: Payer: Self-pay | Admitting: Family Medicine

## 2020-04-15 ENCOUNTER — Other Ambulatory Visit: Payer: Self-pay

## 2020-04-15 ENCOUNTER — Emergency Department (HOSPITAL_COMMUNITY)
Admission: EM | Admit: 2020-04-15 | Discharge: 2020-04-15 | Disposition: A | Discharge status: Home / Self Care | Payer: Medicare HMO | Attending: Emergency Medicine | Admitting: Emergency Medicine

## 2020-04-15 ENCOUNTER — Emergency Department (HOSPITAL_COMMUNITY): Payer: Medicare HMO

## 2020-04-15 DIAGNOSIS — R0609 Other forms of dyspnea: Secondary | ICD-10-CM | POA: Insufficient documentation

## 2020-04-15 DIAGNOSIS — R0600 Dyspnea, unspecified: Secondary | ICD-10-CM

## 2020-04-15 DIAGNOSIS — Z794 Long term (current) use of insulin: Secondary | ICD-10-CM | POA: Diagnosis not present

## 2020-04-15 DIAGNOSIS — I4891 Unspecified atrial fibrillation: Secondary | ICD-10-CM | POA: Diagnosis not present

## 2020-04-15 DIAGNOSIS — Z79899 Other long term (current) drug therapy: Secondary | ICD-10-CM | POA: Diagnosis not present

## 2020-04-15 DIAGNOSIS — I10 Essential (primary) hypertension: Secondary | ICD-10-CM | POA: Insufficient documentation

## 2020-04-15 DIAGNOSIS — I2510 Atherosclerotic heart disease of native coronary artery without angina pectoris: Secondary | ICD-10-CM | POA: Diagnosis not present

## 2020-04-15 DIAGNOSIS — E119 Type 2 diabetes mellitus without complications: Secondary | ICD-10-CM | POA: Diagnosis not present

## 2020-04-15 DIAGNOSIS — Z951 Presence of aortocoronary bypass graft: Secondary | ICD-10-CM | POA: Diagnosis not present

## 2020-04-15 DIAGNOSIS — R0602 Shortness of breath: Secondary | ICD-10-CM | POA: Diagnosis not present

## 2020-04-15 LAB — CBC
HCT: 42 % (ref 39.0–52.0)
Hemoglobin: 13.5 g/dL (ref 13.0–17.0)
MCH: 30.3 pg (ref 26.0–34.0)
MCHC: 32.1 g/dL (ref 30.0–36.0)
MCV: 94.2 fL (ref 80.0–100.0)
Platelets: 246 10*3/uL (ref 150–400)
RBC: 4.46 MIL/uL (ref 4.22–5.81)
RDW: 14 % (ref 11.5–15.5)
WBC: 7.9 10*3/uL (ref 4.0–10.5)
nRBC: 0 % (ref 0.0–0.2)

## 2020-04-15 LAB — BASIC METABOLIC PANEL
Anion gap: 9 (ref 5–15)
BUN: 20 mg/dL (ref 8–23)
CO2: 22 mmol/L (ref 22–32)
Calcium: 9.1 mg/dL (ref 8.9–10.3)
Chloride: 107 mmol/L (ref 98–111)
Creatinine, Ser: 1.12 mg/dL (ref 0.61–1.24)
GFR calc Af Amer: >60 mL/min (ref >60)
GFR calc non Af Amer: >60 mL/min (ref >60)
Glucose, Bld: 128 mg/dL — ABNORMAL HIGH (ref 70–99)
Potassium: 4 mmol/L (ref 3.5–5.1)
Sodium: 138 mmol/L (ref 135–145)

## 2020-04-15 LAB — PROTIME-INR
INR: 1 (ref 0.8–1.2)
Prothrombin Time: 13 seconds (ref 11.4–15.2)

## 2020-04-15 LAB — TROPONIN I (HIGH SENSITIVITY): Troponin I (High Sensitivity): 4 ng/L (ref <18)

## 2020-04-15 MED ORDER — NITROGLYCERIN 0.4 MG SL SUBL: 0.4000 mg | SUBLINGUAL_TABLET | SUBLINGUAL | 0 refills | Status: AC | PRN

## 2020-04-15 NOTE — ED Provider Notes (Signed)
[facility] EMERGENCY DEPARTMENT Provider Note   CSN: [id number] Arrival date & time: 04/15/20  1627     History Chief Complaint  Patient presents with  . Shortness of Breath  . Chest Pain    Alex Yang is a 64 y.o. male.  HPI   This patient is a 64 year old male, he has a known history of diabetes, he has a history of hypertension, he has had a myocardial infarction and a subsequent coronary artery bypass graft.  He is currently in the hospital today with a complaint of some shortness of breath with exertion as well as some bilateral shoulder pain, he states that this occurs primarily when he is exerting himself and goes away when he is resting.  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  For example he has walked in on 2 different people doing crack cocaine in the hotel room, he has had to kick them out of the hotel and this is made him very stressed.  Currently at this point the patient is symptom-free and feeling great.  He denies any chest pain or shortness of breath at this time, he has had no swelling of his legs.  He does wanted to get checked out given his recent increase in symptoms.  The patient was seen most recently in March 2021 by his cardiologist in follow-up, has been doing well since that time  Past Medical History:  Diagnosis Date  . Anxiety   . Colon cancer screening 02/2017   Pt declined colonoscopy, but agreed to cologuard screening--this test was POSITIVE on 03/11/17---f/u colonoscopy 01/28/18 w/adenomatous polyps-->recall 1 yr.  . Coronary artery disease    CABG 2014.  . Diabetes mellitus type 2 with complications ([place])  83/4196   DPN and microalbuminuria.  . Dupuytren's disease 09/2017   ortho 09/2017, L hand  . Erectile dysfunction    Dr. Tresa Moore (urol is also doing prostate ca screening).  Refractory to medications.  Pt likely to get penile implant as of urol f/u 01/2015.  Johney Maine hematuria 2017   Pt is to bring this up with his urologist as of 06/2016.  Marland Kitchen Hyperlipidemia   . Hypertension   . Increased prostate specific antigen (PSA) velocity 02/2018   PSA 1.7 to 2.7 from 02/2017 to 02/2018.  Repeat PSA 09/2018 was 3-->referral to urology done, pt did not go. Rpt PSA 11/2019 was 1.5. Holding off on urol ref..  . NSTEMI (non-ST elevated myocardial infarction) [facility]) 07/2013   CABG x 22 Jul 2013  . Obesity, Class I, BMI 30-34.9   . Paroxysmal atrial fibrillation ([place]) 07/2013   Started post-op CABG, converted on amiodarone--amiodarone stopped after a couple months  . Stenosing tenosynovitis of thumb 2019   left; incarcerated in extension (ortho visit 09/2017--steroid injection).    Patient Active Problem List   Diagnosis Date Noted  . Diabetes mellitus with complication ([place]) 22/29/7989  . Anxiety and depression 04/19/2015  . Essential hypertension 06/26/2014  . Insomnia 03/08/2014  . Local skin infection 03/08/2014  . Erectile dysfunction 03/08/2014  . Preventative health care 03/08/2014  .  PAF (paroxysmal atrial fibrillation) ([place]) 09/20/2013  . Right leg pain 09/20/2013  . Hyperlipidemia 09/06/2013  . Cerebral vascular disease 08/15/2013  . S/P CABG x 5 08/02/2013  . CAD (coronary artery disease) 07/29/2013    Past Surgical History:  Procedure Laterality Date  . COLONOSCOPY W/ POLYPECTOMY  01/28/2018   Mild diverticulosis, small int hem.  Adenomatous polyp: recall 1 yr per GI  . CORONARY ARTERY BYPASS GRAFT N/A 08/02/2013   Procedure: CORONARY ARTERY BYPASS GRAFTING (CABG);  Surgeon: Grace Isaac, MD;  Location: [place];  Service: Open Heart Surgery;  Laterality: N/A;  Coronary artery bypass  graft times five utilizing the left internal mammary artery and the right greater saphenous vein.  Marland Kitchen ENDOVEIN HARVEST OF GREATER SAPHENOUS VEIN Right 08/02/2013   Procedure: ENDOVEIN HARVEST OF GREATER SAPHENOUS VEIN;  Surgeon: Grace Isaac, MD;  Location: [place];  Service: Open Heart Surgery;  Laterality: Right;  . INTRAOPERATIVE TRANSESOPHAGEAL ECHOCARDIOGRAM N/A 08/02/2013   Procedure: INTRAOPERATIVE TRANSESOPHAGEAL ECHOCARDIOGRAM;  Surgeon: Grace Isaac, MD;  Location: [place];  Service: Open Heart Surgery;  Laterality: N/A;.  EF 40%, LVH and LV dilatation, inf wall hypokinesis (at the time of his MI)  . LEFT HEART CATHETERIZATION WITH CORONARY ANGIOGRAM N/A 08/01/2013   Procedure: LEFT HEART CATHETERIZATION WITH CORONARY ANGIOGRAM;  Surgeon: Blane Ohara, MD;  Location[facility] CATH LAB;  Service: Cardiovascular;  Laterality: N/A;  . NASAL FRACTURE SURGERY  1979  . SHOULDER SURGERY  1983   left; pins are in place  . TRANSTHORACIC ECHOCARDIOGRAM  07/2013   EF 40%+ wall motion abnl--in the context of acute NSTEMI       Family History  Problem Relation Age of Onset  . Heart attack Mother   . Heart disease Mother   . Diabetes Mother   . Heart disease Father   . Cancer Brother        lung and liver  . Colon cancer Neg Hx   . Rectal cancer Neg Hx     Social History   Tobacco Use  . Smoking status: Never Smoker  . Smokeless tobacco: Never Used  Vaping Use  . Vaping Use: Never used  Substance Use Topics  . Alcohol use: No    Alcohol/week: 0.0 standard drinks  . Drug use: No    Home Medications Prior to Admission medications   Medication Sig Start Date End Date Taking? Authorizing Provider  ACCU-CHEK AVIVA PLUS test strip USE  TO CHECK BLOOD SUGAR THREE TIMES DAILY 04/11/20   McGowen, Adrian Blackwater, MD  ACCU-CHEK SOFTCLIX LANCETS lancets Use as instructed 09/30/17   McGowen, Adrian Blackwater, MD  albuterol (PROVENTIL HFA;VENTOLIN HFA) 108 (90 Base) MCG/ACT inhaler Inhale 2 puffs into  the lungs every 4 (four) hours as needed for wheezing or shortness of breath. 09/30/17   McGowen, Adrian Blackwater, MD  Alcohol Swabs (B-D SINGLE USE SWABS REGULAR) PADS USE TO CHECK BLOOD SUGAR THREE TIMES DAILY 08/16/18   McGowen, Adrian Blackwater, MD  atorvastatin (LIPITOR) 20 MG tablet TAKE 1 TABLET DAILY AT 6 PM. 04/03/20   McGowen, Adrian Blackwater, MD  Blood Glucose Calibration (ACCU-CHEK AVIVA) SOLN 1 Bottle by In Vitro route as needed. 09/30/17   McGowen, Adrian Blackwater, MD  Blood Glucose Monitoring Suppl (ACCU-CHEK AVIVA PLUS) w/Device KIT  07/21/19   [provider]  citalopram (CELEXA) 20 MG tablet TAKE 1 TABLET EVERY DAY 03/26/20   McGowen, Adrian Blackwater, MD  clopidogrel (PLAVIX) 75 MG tablet TAKE 1 TABLET DAILY  WITH BREAKFAST. ([place] Acie Fredrickson IN [facility] FOR FUTURE REFILLS) 01/02/20   Nahser, Wonda Cheng, MD  gabapentin (NEURONTIN) 300 MG capsule TAKE 1 CAPSULE AT BEDTIME 01/13/20   McGowen, Adrian Blackwater, MD  Insulin Pen Needle 31G X 8 MM MISC Use to inject Victoza SQ. 02/27/20   McGowen, Adrian Blackwater, MD  LANCETS ULTRA THIN 30G MISC 1 Units by Does not apply route 4 (four) times daily. Breakfast, Lunch, Dinner, Bedtime 08/08/13   Barrett, Lodema Hong, PA-C  liraglutide (VICTOZA) 18 MG/3ML SOPN 1.2 mg SQ qd 03/26/20   McGowen, Adrian Blackwater, MD  LORazepam (ATIVAN) 0.5 MG tablet 1-2 tabs po q8h prn anxiety 03/22/20   McGowen, Adrian Blackwater, MD  losartan (COZAAR) 100 MG tablet Take 1 tablet (100 mg total) by mouth daily. TAKE 1 TABLET BY MOUTH EVERY DAY 05/13/19   McGowen, Adrian Blackwater, MD  metFORMIN (GLUCOPHAGE) 1000 MG tablet Take 1 tablet (1,000 mg total) by mouth 2 (two) times daily with a meal. 03/22/20   McGowen, Adrian Blackwater, MD  metoprolol tartrate (LOPRESSOR) 50 MG tablet TAKE 1 TABLET BY MOUTH TWICE A DAY 02/07/20   McGowen, Adrian Blackwater, MD  nitroGLYCERIN (NITROSTAT) 0.4 MG SL tablet Place 1 tablet (0.4 mg total) under the tongue every 5 (five) minutes as needed for chest pain. 04/15/20   Noemi Chapel, MD  traZODone (DESYREL)  50 MG tablet TAKE 1 TO 2 TABLETS AT BEDTIME AS NEEDED FOR INSOMNIA 09/15/19   McGowen, Adrian Blackwater, MD    Allergies    Oxycodone and Pioglitazone  Review of Systems   Review of Systems  All other systems reviewed and are negative.   Physical Exam Updated Vital Signs BP 136/81   Pulse 72   Resp 11   Ht 1.829 m (6')   Wt 98.5 kg   SpO2 100%   BMI 29.45 kg/m   Physical Exam Vitals and nursing note reviewed.  Constitutional:      General: He is not in acute distress.    Appearance: He is well-developed.  HENT:     Head: Normocephalic and atraumatic.     Mouth/Throat:     Pharynx: No oropharyngeal exudate.  Eyes:     General: No scleral icterus.       Right eye: No discharge.        Left eye: No discharge.     Conjunctiva/sclera: Conjunctivae normal.     Pupils: Pupils are equal, round, and reactive to light.  Neck:     Thyroid: No thyromegaly.     Vascular: No JVD.  Cardiovascular:     Rate and Rhythm: Normal rate and regular rhythm.     Heart sounds: Normal heart sounds. No murmur heard.  No friction rub. No gallop.   Pulmonary:     Effort: Pulmonary effort is normal. No respiratory distress.     Breath sounds: Normal breath sounds. No wheezing or rales.  Abdominal:     General: Bowel sounds are normal. There is no distension.     Palpations: Abdomen is soft. There is no mass.     Tenderness: There is no abdominal tenderness.  Musculoskeletal:        General: No tenderness. Normal range of motion.     Cervical back: Normal range of motion and neck supple.  Lymphadenopathy:     Cervical: No cervical adenopathy.  Skin:    General: Skin is warm and dry.     Findings: No erythema or rash.  Neurological:     Mental Status: He is alert.     Coordination: Coordination normal.  Psychiatric:        Behavior: Behavior normal.     ED Results / Procedures / Treatments   Labs (all labs ordered are listed, but only abnormal results are displayed) Labs Reviewed  BASIC  METABOLIC PANEL - Abnormal; Notable for the following components:      Result Value   Glucose, Bld 128 (*)    All other components within normal limits  CBC  PROTIME-INR  TROPONIN I (HIGH SENSITIVITY)    EKG EKG Interpretation  Date/Time:  _0 /29/21 2035           Noemi Chapel, MD 04/15/20 2036

## 2020-04-15 NOTE — Discharge Instructions (Addendum)
Thankfully your testing is all been normal including your heart attack testing, your blood work was normal, your EKG did not show any signs of heart attack and your vital signs have looked beautiful with no signs of high blood pressure or fast heart rate.  I would encourage you to follow-up very closely with your heart doctor within the week, it would be good to have a recheck, you may need another stress test.  Please avoid exertional activities or anything that makes you have pain or shortness of breath.  If it gets worse or is concerning in any other way please return to the emergency department immediately.  Otherwise call your cardiac doctor tomorrow for the next available appointment.  I have given you the phone number above for our local cardiologist  If you are having to use the nitroglycerin please come back to the emergency department

## 2020-04-15 NOTE — ED Triage Notes (Signed)
Pt with hx of CABG   Reports shortness of breath when climbing up stairs  Denies pain at present

## 2020-04-18 HISTORY — PX: CARDIOVASCULAR STRESS TEST: SHX262

## 2020-04-26 ENCOUNTER — Ambulatory Visit (INDEPENDENT_AMBULATORY_CARE_PROVIDER_SITE_OTHER): Payer: Medicare HMO | Admitting: Family Medicine

## 2020-04-26 ENCOUNTER — Encounter: Payer: Self-pay | Admitting: Family Medicine

## 2020-04-26 ENCOUNTER — Other Ambulatory Visit: Payer: Self-pay

## 2020-04-26 VITALS — BP 127/83 | HR 84 | Temp 97.9°F | Resp 16 | Ht 73.5 in | Wt 207.0 lb

## 2020-04-26 DIAGNOSIS — R0609 Other forms of dyspnea: Secondary | ICD-10-CM

## 2020-04-26 DIAGNOSIS — I255 Ischemic cardiomyopathy: Secondary | ICD-10-CM

## 2020-04-26 DIAGNOSIS — R079 Chest pain, unspecified: Secondary | ICD-10-CM | POA: Diagnosis not present

## 2020-04-26 DIAGNOSIS — I252 Old myocardial infarction: Secondary | ICD-10-CM | POA: Diagnosis not present

## 2020-04-26 DIAGNOSIS — I2510 Atherosclerotic heart disease of native coronary artery without angina pectoris: Secondary | ICD-10-CM | POA: Diagnosis not present

## 2020-04-26 DIAGNOSIS — R0600 Dyspnea, unspecified: Secondary | ICD-10-CM | POA: Diagnosis not present

## 2020-04-26 NOTE — Progress Notes (Signed)
OFFICE VISIT  04/26/2020  CC:  Chief Complaint  Patient presents with  . Follow-up    ED visit on 8/29    HPI:    Patient is a 64 y.o. Caucasian male who presents for ED f/u for exertional (went up 3 flights of stairs) SOB and bilat shoulder pains, [facility] 04/15/20.  Excessive job and relationship/financial stress contributing to overall health in the preceding weeks.  EKG neg acute/unchanged, CXR normal, met panel, cbc, and troponin all normal.  D/cd home with prn sl NTG. I reviewed his EKG from Ed visit today and agree it is normal/unchanged compared to EKG 10/2019. Reviewed PA/Lat CXR from 04/15/20 as well and agree -->normal.  INTERIM HX: He has quit his job now. Feeling about the same regarding poor energy level. No CP, no SOB, no arm or jaw pain.  No diaphoresis or nausea. No LE swelling. He has not done any further exertional activity since prior to the ED. 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  This will not involve heavy lifting or signif exertion.  ROS: no fevers, no wheezing, no cough, no dizziness, no HAs, no rashes, no melena/hematochezia.  No polyuria or polydipsia.  No myalgias or arthralgias.  No focal weakness, paresthesias, or tremors.  No acute vision or hearing abnormalities. No n/v/d or abd pain.  No palpitations.    Past Medical History:  Diagnosis Date  . Anxiety   . Colon cancer screening 02/2017   Pt declined colonoscopy, but agreed to cologuard screening--this test was POSITIVE on 03/11/17---f/u colonoscopy 01/28/18 w/adenomatous polyps-->recall 1 yr.  . Coronary artery disease    CABG 2014.  . Diabetes mellitus type 2 with complications ([place]) 62/3762   DPN and microalbuminuria.  . Dupuytren's disease 09/2017   ortho 09/2017, L hand  . Erectile dysfunction    Dr. Tresa Moore (urol is also doing prostate ca screening).  Refractory to medications.  Pt likely to get penile implant as of urol f/u 01/2015.  Johney Maine hematuria 2017   Pt is  to bring this up with his urologist as of 06/2016.  Marland Kitchen Hyperlipidemia   . Hypertension   . Increased prostate specific antigen (PSA) velocity 02/2018   PSA 1.7 to 2.7 from 02/2017 to 02/2018.  Repeat PSA 09/2018 was 3-->referral to urology done, pt did not go. Rpt PSA 11/2019 was 1.5. Holding off on urol ref..  . NSTEMI (non-ST elevated myocardial infarction) [facility]) 07/2013   CABG x 22 Jul 2013  . Obesity, Class I, BMI 30-34.9   . Paroxysmal atrial fibrillation ([place]) 07/2013   Started post-op CABG, converted on amiodarone--amiodarone stopped after a couple months  . Stenosing tenosynovitis of thumb 2019   left; incarcerated in extension (ortho visit 09/2017--steroid injection).    Past Surgical History:  Procedure Laterality Date  . COLONOSCOPY W/ POLYPECTOMY  01/28/2018   Mild diverticulosis, small int hem.  Adenomatous polyp: recall 1 yr per GI  . CORONARY ARTERY BYPASS GRAFT N/A 08/02/2013   Procedure: CORONARY ARTERY BYPASS GRAFTING (CABG);  Surgeon: Grace Isaac, MD;  Location: [place];  Service: Open Heart Surgery;  Laterality: N/A;  Coronary artery bypass graft times five utilizing the left internal mammary artery and the right greater saphenous vein.  Marland Kitchen ENDOVEIN HARVEST OF GREATER SAPHENOUS VEIN Right 08/02/2013   Procedure: ENDOVEIN HARVEST OF GREATER SAPHENOUS VEIN;  Surgeon: Grace Isaac, MD;  Location: [place];  Service: Open Heart Surgery;  Laterality: Right;  . INTRAOPERATIVE  TRANSESOPHAGEAL ECHOCARDIOGRAM N/A 08/02/2013   Procedure: INTRAOPERATIVE TRANSESOPHAGEAL ECHOCARDIOGRAM;  Surgeon: Grace Isaac, MD;  Location: [place];  Service: Open Heart Surgery;  Laterality: N/A;.  EF 40%, LVH and LV dilatation, inf wall hypokinesis (at the time of his MI)  . LEFT HEART CATHETERIZATION WITH CORONARY ANGIOGRAM N/A 08/01/2013   Procedure: LEFT HEART CATHETERIZATION WITH CORONARY ANGIOGRAM;  Surgeon: Blane Ohara, MD;  Location[facility] C[facility] LAB;  Service: Cardiovascular;  Laterality:  N/A;  . NASAL FRACTURE SURGERY  1979  . SHOULDER SURGERY  1983   left; pins are in place  . TRANSTHORACIC ECHOCARDIOGRAM  07/2013   EF 40%+ wall motion abnl--in the context of acute NSTEMI    Outpatient Medications Prior to Visit  Medication Sig Dispense Refill  . ACCU-CHEK AVIVA PLUS test strip USE  TO CHECK BLOOD SUGAR THREE TIMES DAILY 300 strip 1  . ACCU-CHEK SOFTCLIX LANCETS lancets Use as instructed 100 each 12  . albuterol (PROVENTIL HFA;VENTOLIN HFA) 108 (90 Base) MCG/ACT inhaler Inhale 2 puffs into the lungs every 4 (four) hours as needed for wheezing or shortness of breath. 1 Inhaler 1  . Alcohol Swabs (B-D SINGLE USE SWABS REGULAR) PADS USE TO CHECK BLOOD SUGAR THREE TIMES DAILY 300 each 1  . atorvastatin (LIPITOR) 20 MG tablet TAKE 1 TABLET DAILY AT 6 PM. 90 tablet 0  . Blood Glucose Calibration (ACCU-CHEK AVIVA) SOLN 1 Bottle by In Vitro route as needed. 1 each 0  . Blood Glucose Monitoring Suppl (ACCU-CHEK AVIVA PLUS) w/Device KIT     . citalopram (CELEXA) 20 MG tablet TAKE 1 TABLET EVERY DAY 90 tablet 0  . clopidogrel (PLAVIX) 75 MG tablet TAKE 1 TABLET DAILY WITH BREAKFAST. (PLEASE KEEP UPCOMING APPOINTMENT WITH DR. Acie Fredrickson IN [facility] FOR FUTURE REFILLS) 90 tablet 3  . gabapentin (NEURONTIN) 300 MG capsule TAKE 1 CAPSULE AT BEDTIME 90 capsule 0  . Insulin Pen Needle 31G X 8 MM MISC Use to inject Victoza SQ. 100 each 2  . LANCETS ULTRA THIN 30G MISC 1 Units by Does not apply route 4 (four) times daily. Breakfast, Lunch, Dinner, Bedtime 100 each 3  . liraglutide (VICTOZA) 18 MG/3ML SOPN 1.2 mg SQ qd 45 mL 6  . losartan (COZAAR) 100 MG tablet Take 1 tablet (100 mg total) by mouth daily. TAKE 1 TABLET BY MOUTH EVERY DAY 90 tablet 1  . metFORMIN (GLUCOPHAGE) 1000 MG tablet Take 1 tablet (1,000 mg total) by mouth 2 (two) times daily with a meal. 180 tablet 3  . metoprolol tartrate (LOPRESSOR) 50 MG tablet TAKE 1 TABLET BY MOUTH TWICE A DAY 180 tablet 0  . traZODone (DESYREL) 50 MG  tablet TAKE 1 TO 2 TABLETS AT BEDTIME AS NEEDED FOR INSOMNIA 180 tablet 3  . LORazepam (ATIVAN) 0.5 MG tablet 1-2 tabs po q8h prn anxiety (Patient not taking: Reported on 04/26/2020) 30 tablet 2  . nitroGLYCERIN (NITROSTAT) 0.4 MG SL tablet Place 1 tablet (0.4 mg total) under the tongue every 5 (five) minutes as needed for chest pain. (Patient not taking: Reported on 04/26/2020) 30 tablet 0   No facility-administered medications prior to visit.    Allergies  Allergen Reactions  . Oxycodone Other (See Comments)    Psych/mental adverse effects  . Pioglitazone Swelling and Rash    ROS As per HPI  PE: Vitals with BMI 04/26/2020 04/15/2020 04/15/2020  Height 6' 1.5" - -  Weight 207 lbs - -  BMI 88.50 - -  Systolic 277 412 878  Diastolic 83 98 77  Pulse 84 71 69     Gen: Alert, well appearing.  Patient is oriented to person, place, time, and situation. AFFECT: pleasant, lucid thought and speech. CV: RRR, no m/r/g.   LUNGS: CTA bilat, nonlabored resps, good aeration in all lung fields. EXT: no clubbing or cyanosis.  no edema.    LABS:  Lab Results  Component Value Date   TSH 2.08 11/21/2019   Lab Results  Component Value Date   WBC 7.9 04/15/2020   HGB 13.5 04/15/2020   HCT 42.0 04/15/2020   MCV 94.2 04/15/2020   PLT 246 04/15/2020   Lab Results  Component Value Date   CREATININE 1.12 04/15/2020   BUN 20 04/15/2020   NA 138 04/15/2020   K 4.0 04/15/2020   CL 107 04/15/2020   CO2 22 04/15/2020   Lab Results  Component Value Date   ALT 33 10/19/2019   AST 26 10/19/2019   ALKPHOS 85 10/19/2019   BILITOT 0.4 10/19/2019   Lab Results  Component Value Date   CHOL 103 10/19/2019   Lab Results  Component Value Date   HDL 50 10/19/2019   Lab Results  Component Value Date   LDLCALC 37 10/19/2019   Lab Results  Component Value Date   TRIG 79 10/19/2019   Lab Results  Component Value Date   CHOLHDL 2.1 10/19/2019   Lab Results  Component Value Date   PSA  1.50 11/21/2019   PSA 3.04 09/24/2018   PSA 2.77 03/10/2018   Lab Results  Component Value Date   HGBA1C 7.4 (H) 02/21/2020     IMPRESSION AND PLAN:  1) CAD: hx of NSTEMI + CABG 2014, EF down some at that time. He has been doing well so no f/u stress testing or echo has been done. In light of recent symptoms I will check these: myocardial perfusion imaging and complete echocardiogram ordered. Cont BB, statin, plavix, losartan.  He has prn sl NTG.  OK for normal nonexertional activity at this time.  An After Visit Summary was printed and given to the patient.  FOLLOW UP: Return for keep f/u arranged for 05/23/20.  Signed:  Crissie Sickles, MD           04/26/2020

## 2020-05-07 ENCOUNTER — Other Ambulatory Visit: Payer: Self-pay | Admitting: Cardiovascular Disease

## 2020-05-09 ENCOUNTER — Encounter (HOSPITAL_COMMUNITY): Payer: Self-pay | Admitting: *Deleted

## 2020-05-10 ENCOUNTER — Other Ambulatory Visit: Payer: Self-pay | Admitting: Family Medicine

## 2020-05-11 ENCOUNTER — Other Ambulatory Visit: Payer: Self-pay

## 2020-05-11 ENCOUNTER — Ambulatory Visit (HOSPITAL_COMMUNITY): Payer: Medicare HMO | Attending: Cardiology

## 2020-05-11 DIAGNOSIS — R079 Chest pain, unspecified: Secondary | ICD-10-CM | POA: Diagnosis not present

## 2020-05-11 DIAGNOSIS — R0600 Dyspnea, unspecified: Secondary | ICD-10-CM | POA: Insufficient documentation

## 2020-05-11 DIAGNOSIS — I2510 Atherosclerotic heart disease of native coronary artery without angina pectoris: Secondary | ICD-10-CM | POA: Diagnosis not present

## 2020-05-11 DIAGNOSIS — I252 Old myocardial infarction: Secondary | ICD-10-CM | POA: Diagnosis not present

## 2020-05-11 DIAGNOSIS — I255 Ischemic cardiomyopathy: Secondary | ICD-10-CM

## 2020-05-11 DIAGNOSIS — R0609 Other forms of dyspnea: Secondary | ICD-10-CM

## 2020-05-11 LAB — MYOCARDIAL PERFUSION IMAGING
LV dias vol: 103 mL (ref 62–150)
LV sys vol: 50 mL
Peak HR: 100 {beats}/min
Rest HR: 76 {beats}/min
SDS: 2
SRS: 0
SSS: 2
TID: 0.97

## 2020-05-11 MED ORDER — REGADENOSON 0.4 MG/5ML IV SOLN
0.4000 mg | Freq: Once | INTRAVENOUS | Status: AC
  Administered 2020-05-11: 0.4 mg via INTRAVENOUS

## 2020-05-11 MED ORDER — TECHNETIUM TC 99M TETROFOSMIN IV KIT
10.3000 | PACK | Freq: Once | INTRAVENOUS | Status: AC | PRN
  Administered 2020-05-11: 10.3 via INTRAVENOUS
  Filled 2020-05-11: qty 11

## 2020-05-11 MED ORDER — TECHNETIUM TC 99M TETROFOSMIN IV KIT
31.7000 | PACK | Freq: Once | INTRAVENOUS | Status: AC | PRN
  Administered 2020-05-11: 31.7 via INTRAVENOUS
  Filled 2020-05-11: qty 32

## 2020-05-13 ENCOUNTER — Other Ambulatory Visit: Payer: Self-pay | Admitting: Cardiovascular Disease

## 2020-05-13 ENCOUNTER — Encounter: Payer: Self-pay | Admitting: Family Medicine

## 2020-05-15 ENCOUNTER — Encounter: Payer: Self-pay | Admitting: Family Medicine

## 2020-05-15 ENCOUNTER — Ambulatory Visit (HOSPITAL_COMMUNITY): Payer: Medicare HMO | Attending: Cardiology

## 2020-05-15 ENCOUNTER — Other Ambulatory Visit: Payer: Self-pay

## 2020-05-15 DIAGNOSIS — R079 Chest pain, unspecified: Secondary | ICD-10-CM | POA: Diagnosis not present

## 2020-05-15 DIAGNOSIS — R0609 Other forms of dyspnea: Secondary | ICD-10-CM

## 2020-05-15 DIAGNOSIS — R0600 Dyspnea, unspecified: Secondary | ICD-10-CM | POA: Diagnosis not present

## 2020-05-15 DIAGNOSIS — I2510 Atherosclerotic heart disease of native coronary artery without angina pectoris: Secondary | ICD-10-CM | POA: Diagnosis not present

## 2020-05-15 DIAGNOSIS — I252 Old myocardial infarction: Secondary | ICD-10-CM

## 2020-05-15 DIAGNOSIS — I255 Ischemic cardiomyopathy: Secondary | ICD-10-CM

## 2020-05-15 LAB — ECHOCARDIOGRAM COMPLETE
Area-P 1/2: 2.54 cm2
S' Lateral: 3.4 cm

## 2020-05-15 MED ORDER — FUROSEMIDE 20 MG PO TABS: 20.0000 mg | ORAL_TABLET | Freq: Every day | ORAL | 0 refills | Status: DC

## 2020-05-15 MED ORDER — LOSARTAN POTASSIUM 100 MG PO TABS
100.0000 mg | ORAL_TABLET | Freq: Every day | ORAL | 1 refills | Status: DC
Start: 2020-05-15 — End: 2020-10-24

## 2020-05-15 NOTE — Progress Notes (Signed)
Medication sent to pharmacy as well as a refill for his lostartan

## 2020-05-23 ENCOUNTER — Ambulatory Visit: Payer: Medicare HMO | Admitting: Family Medicine

## 2020-05-23 ENCOUNTER — Other Ambulatory Visit: Payer: Self-pay | Admitting: Family Medicine

## 2020-05-24 NOTE — Telephone Encounter (Signed)
Unable to LVM, attempt to contact Alex Yang in regards to medication request. Would like to know if pt has enough to last until appointment.

## 2020-05-28 ENCOUNTER — Other Ambulatory Visit: Payer: Self-pay | Admitting: Family Medicine

## 2020-05-29 ENCOUNTER — Telehealth: Payer: Self-pay

## 2020-05-29 NOTE — Telephone Encounter (Signed)
FYI. Please see below.  Patient called to cancel appt for today due to exposure to someone who recently tested positive for COVID. Appt rescheduled to 10/26, will call back if things change. He also mentioned no longer taking furosemide prescribed on 9/28 due to feeling dizzy. Last dose taken yesterday, it is subsiding but still present at this time.

## 2020-05-29 NOTE — Telephone Encounter (Signed)
Noted  

## 2020-05-30 ENCOUNTER — Ambulatory Visit: Payer: Medicare HMO | Admitting: Family Medicine

## 2020-06-10 ENCOUNTER — Other Ambulatory Visit: Payer: Self-pay | Admitting: Family Medicine

## 2020-06-12 ENCOUNTER — Encounter: Payer: Self-pay | Admitting: Family Medicine

## 2020-06-12 ENCOUNTER — Other Ambulatory Visit: Payer: Self-pay

## 2020-06-12 ENCOUNTER — Ambulatory Visit (INDEPENDENT_AMBULATORY_CARE_PROVIDER_SITE_OTHER): Payer: Medicare HMO | Admitting: Family Medicine

## 2020-06-12 VITALS — BP 130/83 | HR 70 | Temp 97.8°F | Resp 16 | Ht 73.5 in | Wt 210.4 lb

## 2020-06-12 DIAGNOSIS — E7800 Pure hypercholesterolemia, unspecified: Secondary | ICD-10-CM | POA: Diagnosis not present

## 2020-06-12 DIAGNOSIS — Z23 Encounter for immunization: Secondary | ICD-10-CM

## 2020-06-12 DIAGNOSIS — M65311 Trigger thumb, right thumb: Secondary | ICD-10-CM | POA: Diagnosis not present

## 2020-06-12 DIAGNOSIS — I2510 Atherosclerotic heart disease of native coronary artery without angina pectoris: Secondary | ICD-10-CM

## 2020-06-12 DIAGNOSIS — I10 Essential (primary) hypertension: Secondary | ICD-10-CM | POA: Diagnosis not present

## 2020-06-12 DIAGNOSIS — E118 Type 2 diabetes mellitus with unspecified complications: Secondary | ICD-10-CM

## 2020-06-12 DIAGNOSIS — F411 Generalized anxiety disorder: Secondary | ICD-10-CM | POA: Diagnosis not present

## 2020-06-12 LAB — POCT GLYCOSYLATED HEMOGLOBIN (HGB A1C)
HbA1c POC (<> result, manual entry): 7 % (ref 4.0–5.6)
HbA1c, POC (controlled diabetic range): 7 % (ref 0.0–7.0)
HbA1c, POC (prediabetic range): 7 % — AB (ref 5.7–6.4)
Hemoglobin A1C: 7 % — AB (ref 4.0–5.6)

## 2020-06-12 MED ORDER — LORAZEPAM 0.5 MG PO TABS
ORAL_TABLET | ORAL | 2 refills | Status: DC
Start: 2020-06-12 — End: 2020-11-08

## 2020-06-12 NOTE — Progress Notes (Signed)
OFFICE VISIT  06/12/2020  CC:  Chief Complaint  Patient presents with  . Follow-up    RCI, pt is not fasting. He has been taking furosemide 45m daily as prescribed but made him feel lightheaded and woozy. Would like to know if he can take 1/2 instead   HPI:    Patient is a 64y.o. Caucasian male who presents for f/u DM, HTN, HLD, CAD. A/P as of last visit 04/26/20: "1) CAD: hx of NSTEMI + CABG 2014, EF down some at that time. He has been doing well so no f/u stress testing or echo has been done. In light of recent symptoms I will check these: myocardial perfusion imaging and complete echocardiogram ordered. Cont BB, statin, plavix, losartan.  He has prn sl NTG.  OK for normal nonexertional activity at this time"  INTERIM HX: Doing well. 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 He took lasix for about a week but it caused orthostatic dizziness so he stopped it.  DM: victoza and metformin. No home monitoring of gluc's, says battery out still.  HTN: no home bp monitoring, machine needs battery, too.  HLD: tolerating statin daily.  Has had R thumb sticking in full extension last 1-2 mo, occ pulls it "out" and can flex but this hurts and it goes right back into locked position in extension. Hx of L thumb trigger, saw ortho and steroid injection took care of it. 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  Anxiety: taking citalopram daily and lorazepam prn (usually a couple times per week).   ROS: no fevers, no CP, no SOB, no wheezing, no cough, no dizziness, no HAs, no rashes, no melena/hematochezia.  No polyuria or polydipsia.  No myalgias or arthralgias.  No focal weakness, paresthesias, or tremors.  No acute vision or hearing abnormalities. No n/v/d or abd pain.  No palpitations.     Past Medical History:  Diagnosis Date  . Anxiety   . Colon cancer  screening 02/2017   Pt declined colonoscopy, but agreed to cologuard screening--this test was POSITIVE on 03/11/17---f/u colonoscopy 01/28/18 w/adenomatous polyps-->recall 1 yr.  . Coronary artery disease    CABG 2014.  . Diabetes mellitus type 2 with complications ([place] 125/6389  DPN and microalbuminuria.  . Dupuytren's disease 09/2017   ortho 09/2017, L hand  . Erectile dysfunction    Dr. MTresa Moore(urol is also doing prostate ca screening).  Refractory to medications.  Pt likely to get penile implant as of urol f/u 01/2015.  .Johney Mainehematuria 2017   Pt is to bring this up with his urologist as of 06/2016.  .Marland KitchenHyperlipidemia   . Hypertension   . Increased prostate specific antigen (PSA) velocity 02/2018   PSA 1.7 to 2.7 from 02/2017 to 02/2018.  Repeat PSA 09/2018 was 3-->referral to urology done, pt did not go. Rpt PSA 11/2019 was 1.5. Holding off on urol ref..  . NSTEMI (non-ST elevated myocardial infarction) ([facility] 07/2013   CABG x 22 Jul 2013  . Obesity, Class I, BMI 30-34.9   . [place]oxysmal atrial fibrillation [place] 07/2013   Started post-op CABG, converted on amiodarone--amiodarone stopped after a couple months  . Stenosing tenosynovitis of thumb 2019   left; incarcerated in extension (ortho visit 09/2017--steroid injection).    Past Surgical History:  Procedure Laterality Date  . CARDIOVASCULAR STRESS TEST  04/2020  NO ISCHEMIA.  LOW RISK.  EF 51% some septal hyokinesis.  . COLONOSCOPY W/ POLYPECTOMY  01/28/2018   Mild diverticulosis, small int hem.  Adenomatous polyp: recall 1 yr per GI  . CORONARY ARTERY BYPASS GRAFT N/A 08/02/2013   Procedure: CORONARY ARTERY BYPASS GRAFTING (CABG);  Surgeon: Grace Isaac, MD;  Location: [place];  Service: Open Heart Surgery;  Laterality: N/A;  Coronary artery bypass graft times five utilizing the left internal mammary artery and the right greater saphenous vein.  Marland Kitchen ENDOVEIN HARVEST OF GREATER SAPHENOUS VEIN Right 08/02/2013   Procedure: ENDOVEIN  HARVEST OF GREATER SAPHENOUS VEIN;  Surgeon: Grace Isaac, MD;  Location: [place];  Service: Open Heart Surgery;  Laterality: Right;  . INTRAOPERATIVE TRANSESOPHAGEAL ECHOCARDIOGRAM N/A 08/02/2013   Procedure: INTRAOPERATIVE TRANSESOPHAGEAL ECHOCARDIOGRAM;  Surgeon: Grace Isaac, MD;  Location: [place];  Service: Open Heart Surgery;  Laterality: N/A;.  EF 40%, LVH and LV dilatation, inf wall hypokinesis (at the time of his MI)  . LEFT HEART CATHETERIZATION WITH CORONARY ANGIOGRAM N/A 08/01/2013   Procedure: LEFT HEART CATHETERIZATION WITH CORONARY ANGIOGRAM;  Surgeon: Blane Ohara, MD;  Location: [facility] CATH LAB;  Service: Cardiovascular;  Laterality: N/A;  . NASAL FRACTURE SURGERY  1979  . SHOULDER SURGERY  1983   left; pins are in place  . TRANSTHORACIC ECHOCARDIOGRAM  07/2013; 04/2020   2014 EF 40%+ wall motion abnl--in the context of acute NSTEMI. 04/2020 Akinesis of the basal inferior wall with overall low normal LV systolic function; EF 08-81%, grd I DD, valves fine.    Outpatient Medications Prior to Visit  Medication Sig Dispense Refill  . ACCU-CHEK AVIVA PLUS test strip USE  TO CHECK BLOOD SUGAR THREE TIMES DAILY 300 strip 1  . ACCU-CHEK SOFTCLIX LANCETS lancets Use as instructed 100 each 12  . albuterol (PROVENTIL HFA;VENTOLIN HFA) 108 (90 Base) MCG/ACT inhaler Inhale 2 puffs into the lungs every 4 (four) hours as needed for wheezing or shortness of breath. 1 Inhaler 1  . Alcohol Swabs (B-D SINGLE USE SWABS REGULAR) PADS USE TO CHECK BLOOD SUGAR THREE TIMES DAILY 300 each 1  . atorvastatin (LIPITOR) 20 MG tablet TAKE 1 TABLET DAILY AT 6 PM. 90 tablet 0  . Blood Glucose Calibration (ACCU-CHEK AVIVA) SOLN 1 Bottle by In Vitro route as needed. 1 each 0  . Blood Glucose Monitoring Suppl (ACCU-CHEK AVIVA PLUS) w/Device KIT     . citalopram (CELEXA) 20 MG tablet TAKE 1 TABLET EVERY DAY 90 tablet 0  . clopidogrel (PLAVIX) 75 MG tablet TAKE 1 TABLET (75 MG TOTAL) BY MOUTH DAILY WITH  BREAKFAST. 90 tablet 3  . gabapentin (NEURONTIN) 300 MG capsule TAKE 1 CAPSULE AT BEDTIME 90 capsule 0  . Insulin Pen Needle 31G X 8 MM MISC Use to inject Victoza SQ. 100 each 2  . LANCETS ULTRA THIN 30G MISC 1 Units by Does not apply route 4 (four) times daily. Breakfast, Lunch, Dinner, Bedtime 100 each 3  . liraglutide (VICTOZA) 18 MG/3ML SOPN 1.2 mg SQ qd 45 mL 6  . losartan (COZAAR) 100 MG tablet Take 1 tablet (100 mg total) by mouth daily. TAKE 1 TABLET BY MOUTH EVERY DAY 90 tablet 1  . metFORMIN (GLUCOPHAGE) 1000 MG tablet Take 1 tablet (1,000 mg total) by mouth 2 (two) times daily with a meal. 180 tablet 3  . metoprolol tartrate (LOPRESSOR) 50 MG tablet TAKE 1 TABLET BY MOUTH TWICE A DAY 180 tablet 0  . nitroGLYCERIN (NITROSTAT) 0.4 MG  SL tablet Place 1 tablet (0.4 mg total) under the tongue every 5 (five) minutes as needed for chest pain. 30 tablet 0  . traZODone (DESYREL) 50 MG tablet TAKE 1 TO 2 TABLETS AT BEDTIME AS NEEDED FOR INSOMNIA 180 tablet 3  . furosemide (LASIX) 20 MG tablet TAKE 1 TABLET BY MOUTH EVERY DAY 30 tablet 0  . LORazepam (ATIVAN) 0.5 MG tablet 1-2 tabs po q8h prn anxiety 30 tablet 2   No facility-administered medications prior to visit.    Allergies  Allergen Reactions  . Oxycodone Other (See Comments)    Psych/mental adverse effects  . Pioglitazone Swelling and Rash    ROS As per HPI  PE: Vitals with BMI 06/12/2020 05/11/2020 04/26/2020  Height 6' 1.5" 6' 1.5" 6' 1.5"  Weight 210 lbs 6 oz 207 lbs 207 lbs  BMI 27.38 51.89 84.21  Systolic 031 - 281  Diastolic 83 - 83  Pulse 70 - 84     Gen: Alert, well appearing.  Patient is oriented to person, place, time, and situation. AFFECT: pleasant, lucid thought and speech. CV: RRR, no m/r/g.   LUNGS: CTA bilat, nonlabored resps, good aeration in all lung fields. EXT: no clubbing or cyanosis.  no edema.  R thumb IP jt fixed in extension.  No erythema, swelling, or tenderness.   LABS:  Lab Results   Component Value Date   TSH 2.08 11/21/2019   Lab Results  Component Value Date   WBC 7.9 04/15/2020   HGB 13.5 04/15/2020   HCT 42.0 04/15/2020   MCV 94.2 04/15/2020   PLT 246 04/15/2020   Lab Results  Component Value Date   CREATININE 1.12 04/15/2020   BUN 20 04/15/2020   NA 138 04/15/2020   K 4.0 04/15/2020   CL 107 04/15/2020   CO2 22 04/15/2020   Lab Results  Component Value Date   ALT 33 10/19/2019   AST 26 10/19/2019   ALKPHOS 85 10/19/2019   BILITOT 0.4 10/19/2019   Lab Results  Component Value Date   CHOL 103 10/19/2019   Lab Results  Component Value Date   HDL 50 10/19/2019   Lab Results  Component Value Date   LDLCALC 37 10/19/2019   Lab Results  Component Value Date   TRIG 79 10/19/2019   Lab Results  Component Value Date   CHOLHDL 2.1 10/19/2019   Lab Results  Component Value Date   PSA 1.50 11/21/2019   PSA 3.04 09/24/2018   PSA 2.77 03/10/2018   Lab Results  Component Value Date   HGBA1C 7.0 (A) 06/12/2020   HGBA1C 7.0 06/12/2020   HGBA1C 7.0 (A) 06/12/2020   HGBA1C 7.0 06/12/2020   POC a1c today 7.0%  IMPRESSION AND PLAN:  1) DM: doing well on victoza and metformin. POC a1c today 7.0% No changes today. Creatinines/GFR + potassium consistently stable over time, most recently about 2 mo ago-->ok to hold off on repeat at this time since no recent med changes.  2) HTN: The current medical regimen is effective;  continue present plan and medications. Cont losartan 166m qd and lopressor 521mbid. See #1 above regarding labs.  3) HLD: tolerating atorva 2053md, continue this. Last lipids 10/2019 were excellent. Plan repeat approx 6 mo.  4) GAD: stable on citalopram and prn lorazepam. CSC UTD. RF'd lorazepam 0.5mg66m-2 tid prn, #30, Rf x 2.  5) CAD, s/p CABG: he is asymptomatic. He had a period of DOE/fatigue about 2 mo ago that we looked  into further. Stress testing and echo good 04/2020 except some grd I DD. Trial of lasix  resulted in orthostatic dizziness.   He stopped this med. I'll take it off his med list---he does not need this med. Cont BB, ARB, statin, plavix. He feels well now.  6) R thumb trigger finger.  Hx of same on L thumb 2019 that responded to steroid injection by ortho. Refer back to ortho.  An After Visit Summary was printed and given to the patient.  FOLLOW UP: Return in about 3 months (around 09/12/2020) for routine chronic illness f/u. Next CPE 11/2020  Signed:  Crissie Sickles, MD           06/12/2020

## 2020-06-13 LAB — MICROALBUMIN / CREATININE URINE RATIO
Creatinine,U: 278.8 mg/dL
Microalb Creat Ratio: 1.2 mg/g (ref 0.0–30.0)
Microalb, Ur: 3.3 mg/dL — ABNORMAL HIGH (ref 0.0–1.9)

## 2020-06-20 ENCOUNTER — Other Ambulatory Visit: Payer: Self-pay | Admitting: Family Medicine

## 2020-06-21 DIAGNOSIS — M65311 Trigger thumb, right thumb: Secondary | ICD-10-CM | POA: Diagnosis not present

## 2020-06-29 ENCOUNTER — Other Ambulatory Visit: Payer: Self-pay | Admitting: Family Medicine

## 2020-06-29 ENCOUNTER — Other Ambulatory Visit: Payer: Self-pay

## 2020-06-29 MED ORDER — CITALOPRAM HYDROBROMIDE 20 MG PO TABS
20.0000 mg | ORAL_TABLET | Freq: Every day | ORAL | 1 refills | Status: DC
Start: 2020-06-29 — End: 2020-12-21

## 2020-07-12 ENCOUNTER — Other Ambulatory Visit: Payer: Self-pay | Admitting: Family Medicine

## 2020-07-18 IMAGING — DX DG ANKLE COMPLETE 3+V*R*
3 series · 3 of 3 positions shown · non-contrast
Comparison: None.

CLINICAL DATA: Right leg contusion, pain

EXAM:
RIGHT ANKLE - COMPLETE 3+ VIEW

[ankle ap]
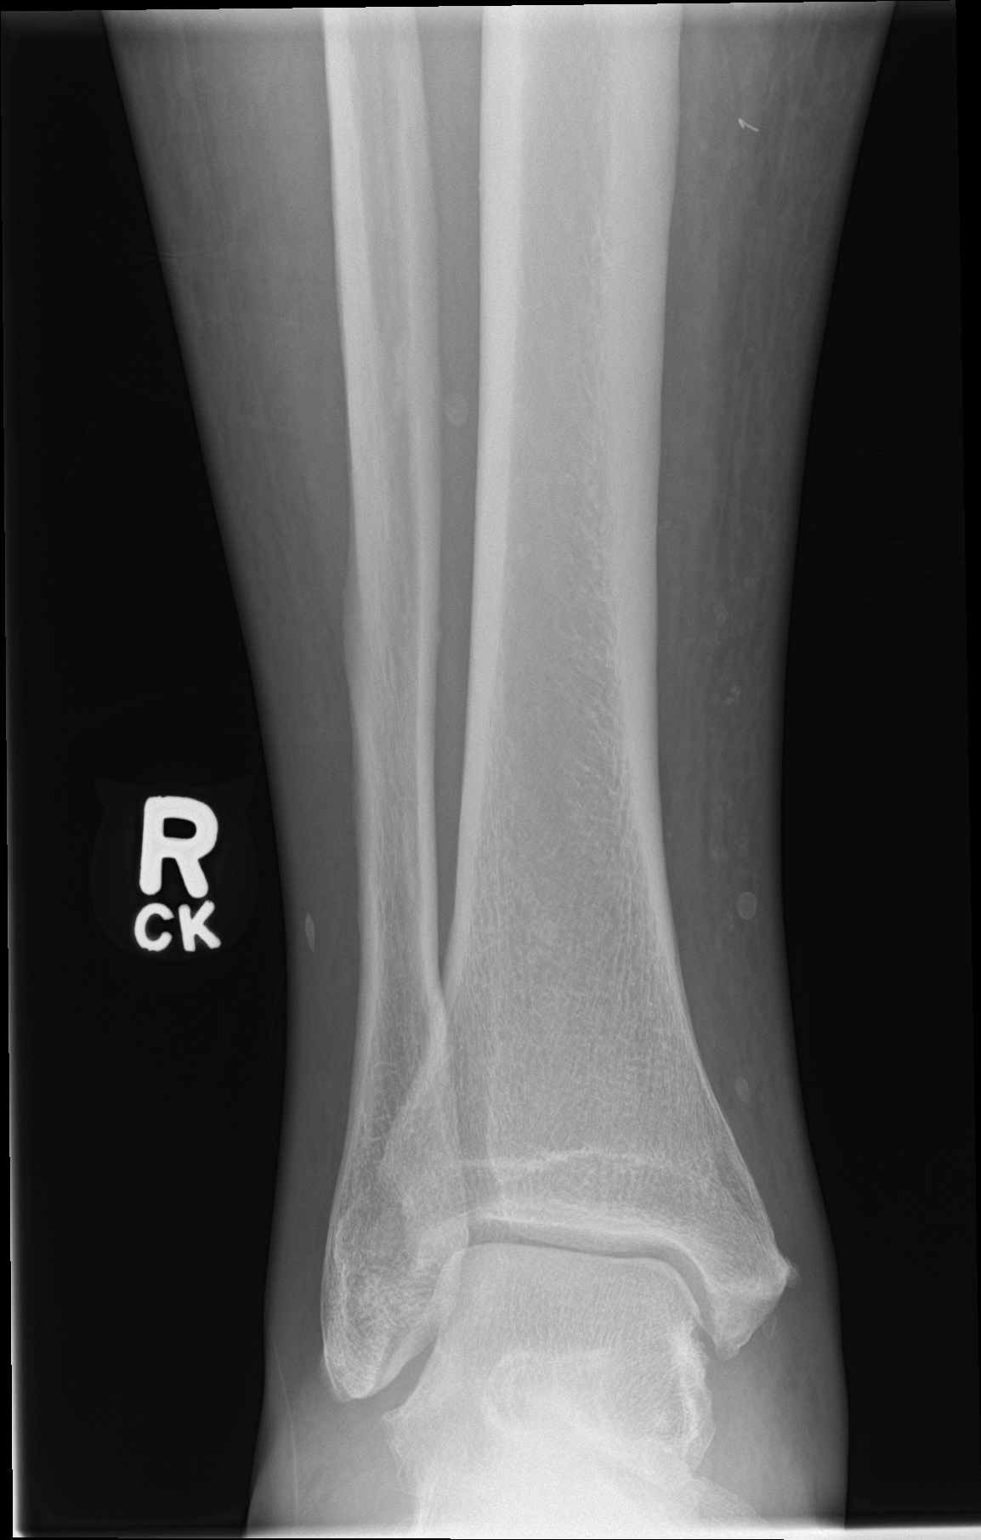

[ankle obl]
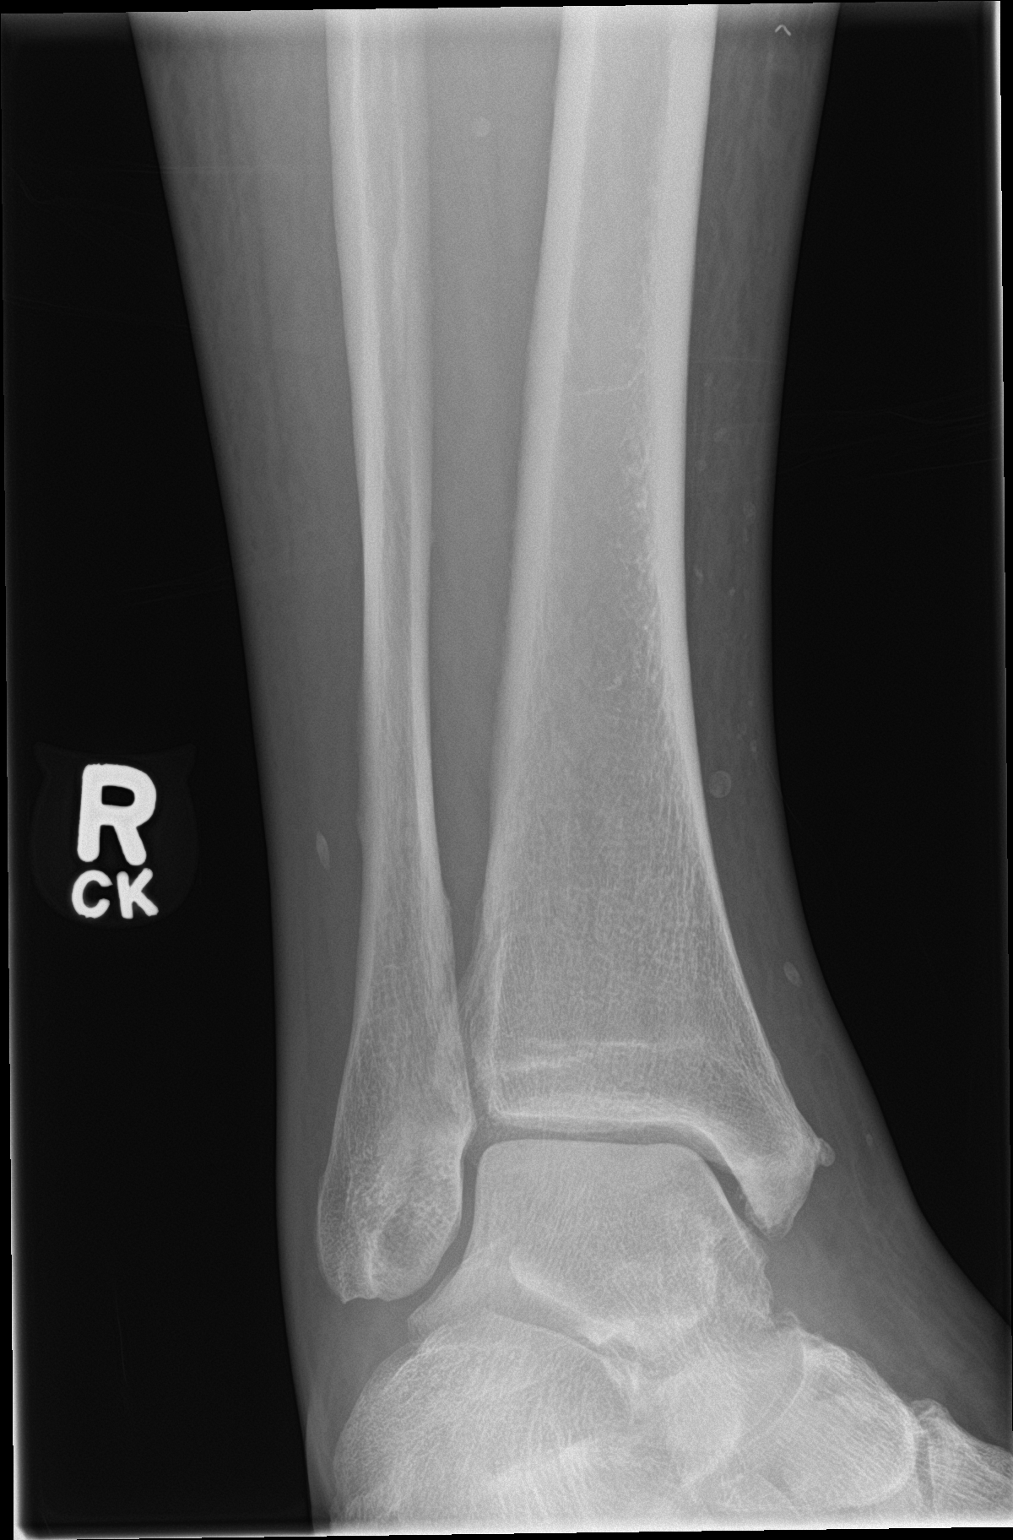

[ankle lat]
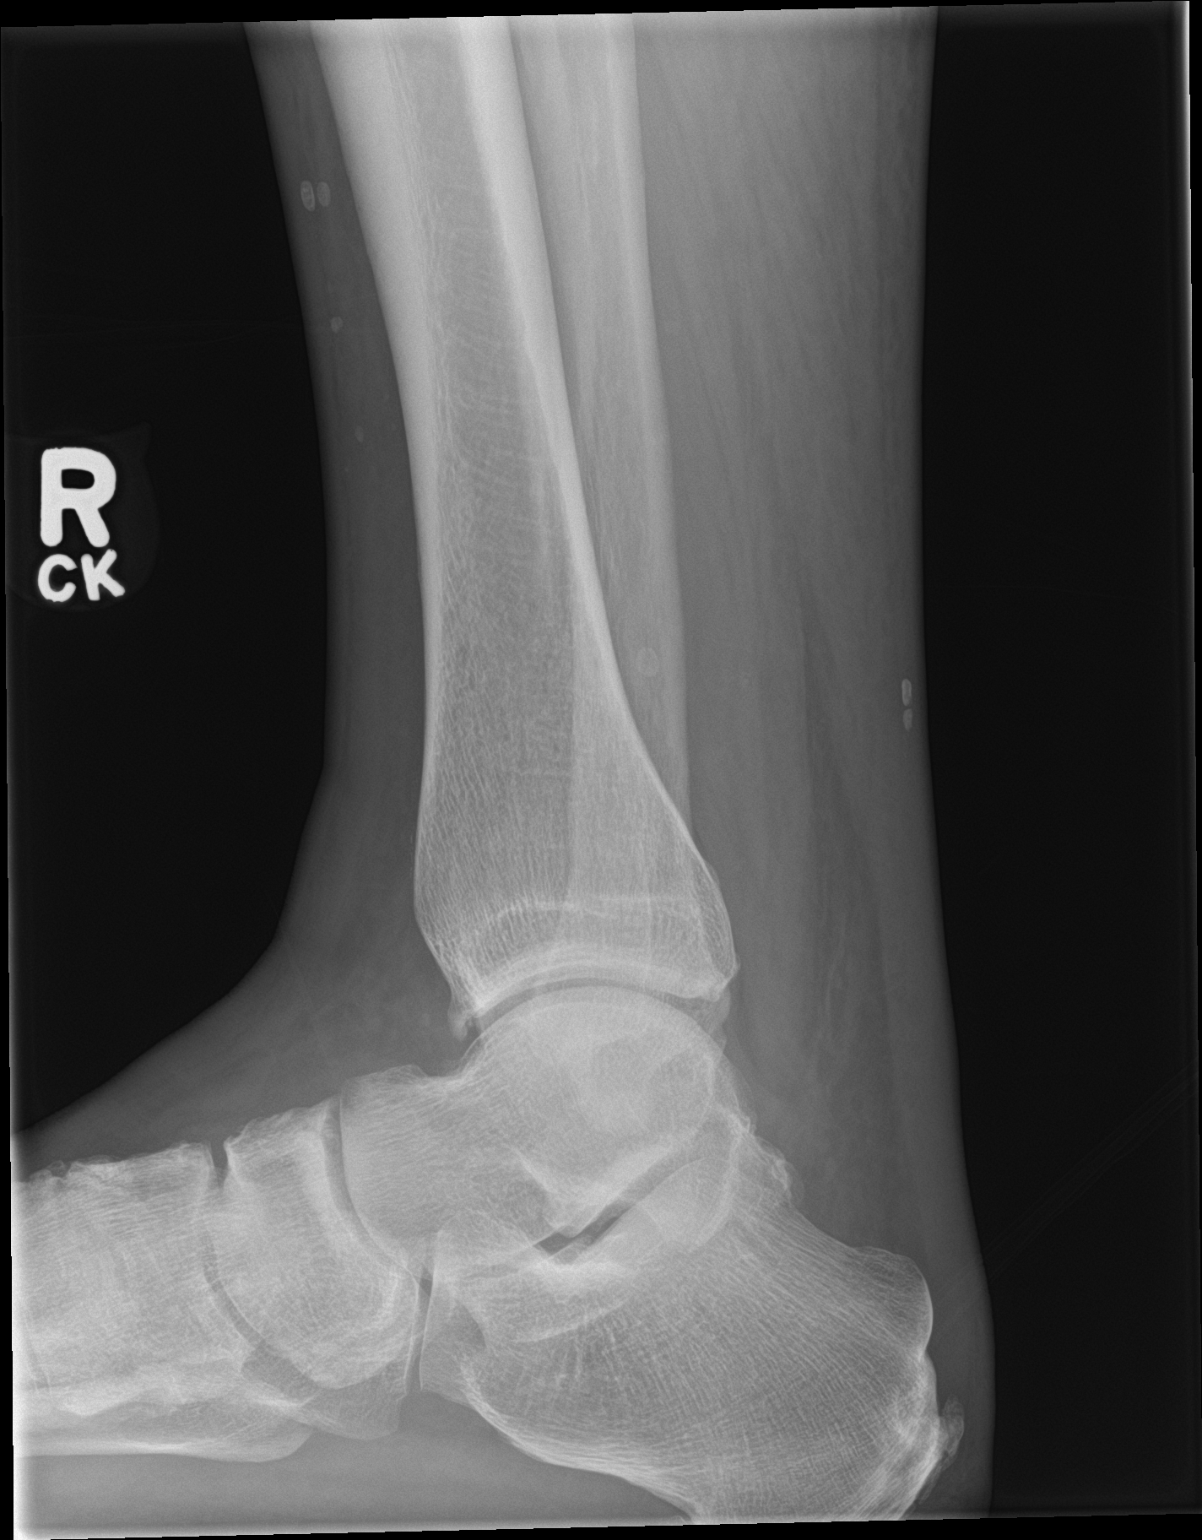

[3 of 3 positions shown; findings below may reference images not displayed]

FINDINGS: Mild arthritic changes in the right ankle with spurring and joint
space narrowing. No acute bony abnormality. Specifically, no
fracture, subluxation, or dislocation.
IMPRESSION: No acute bony abnormality.

## 2020-07-18 IMAGING — DX DG TIBIA/FIBULA 2V*R*
4 series · 4 of 4 positions shown · non-contrast
Comparison: None.

CLINICAL DATA: Right leg contusion, pain

EXAM:
RIGHT TIBIA AND FIBULA - 2 VIEW

[tibia ap (1 of 2)]
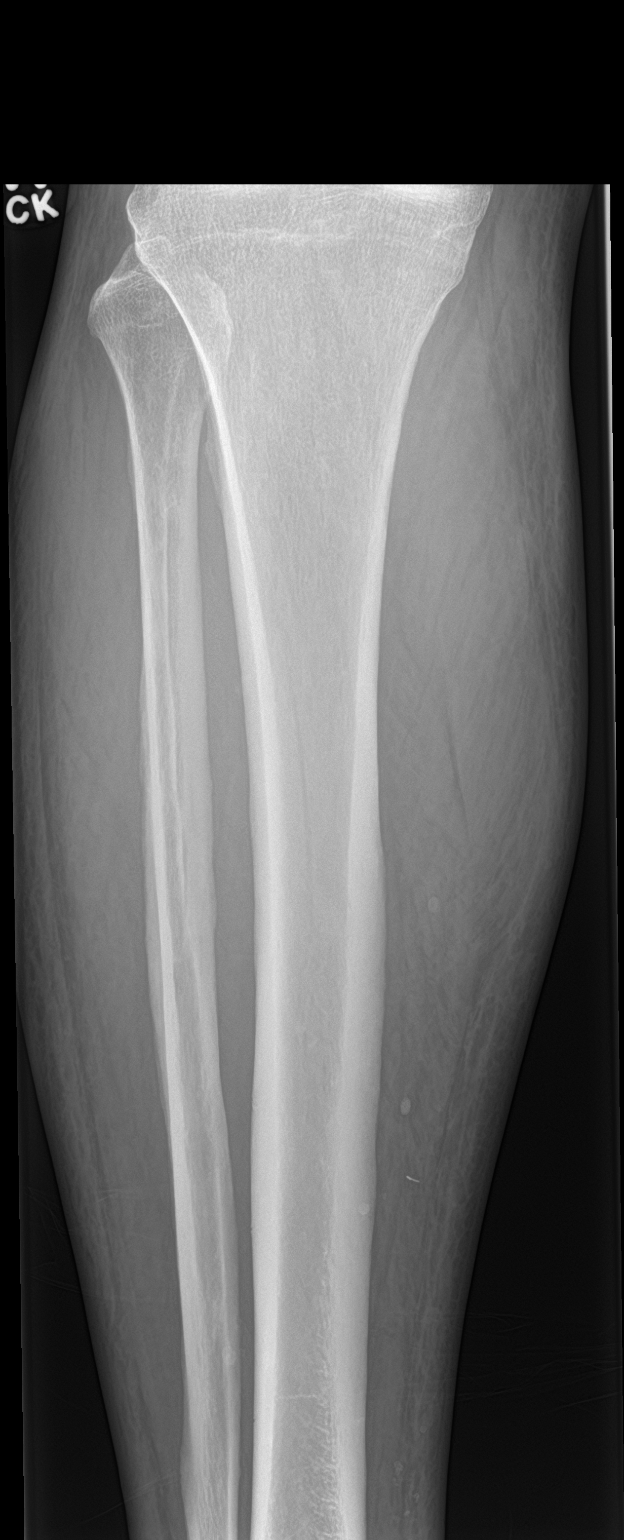

[tibia ap (2 of 2)]
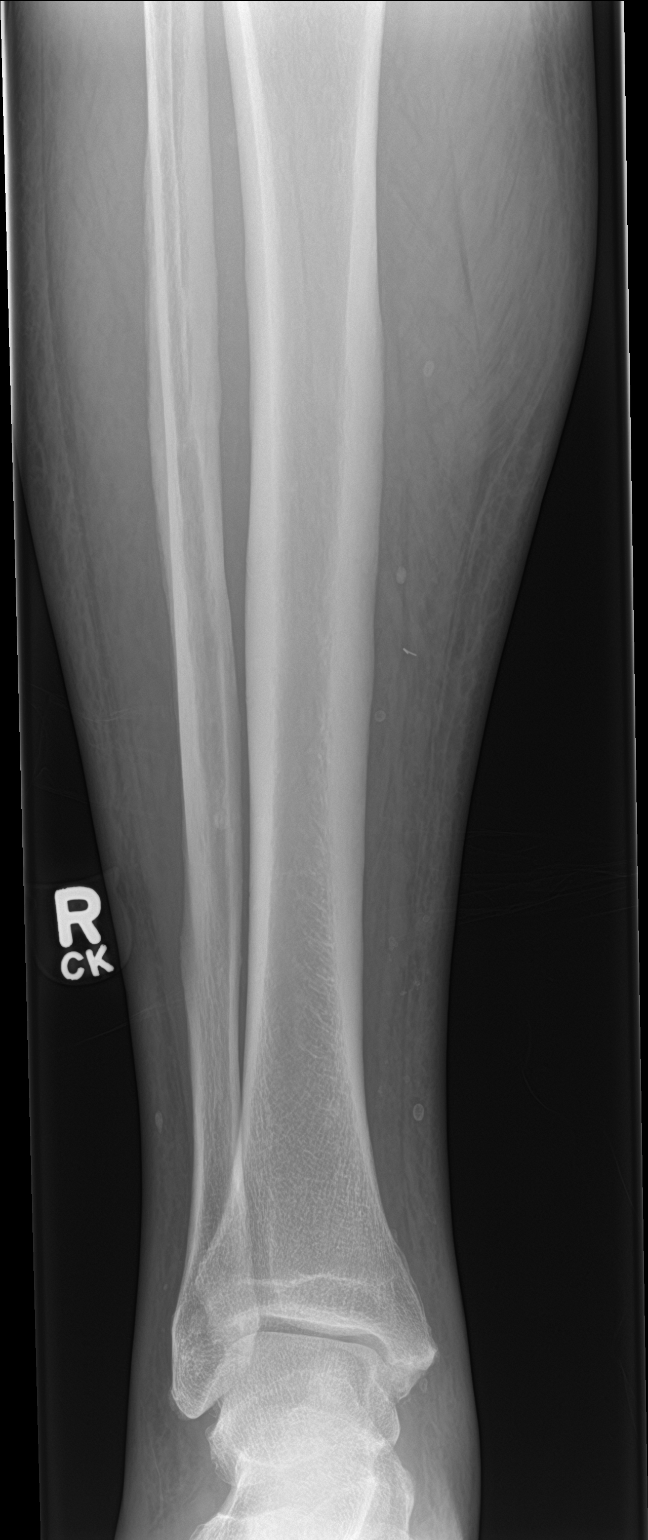

[tibia lat (1 of 2)]
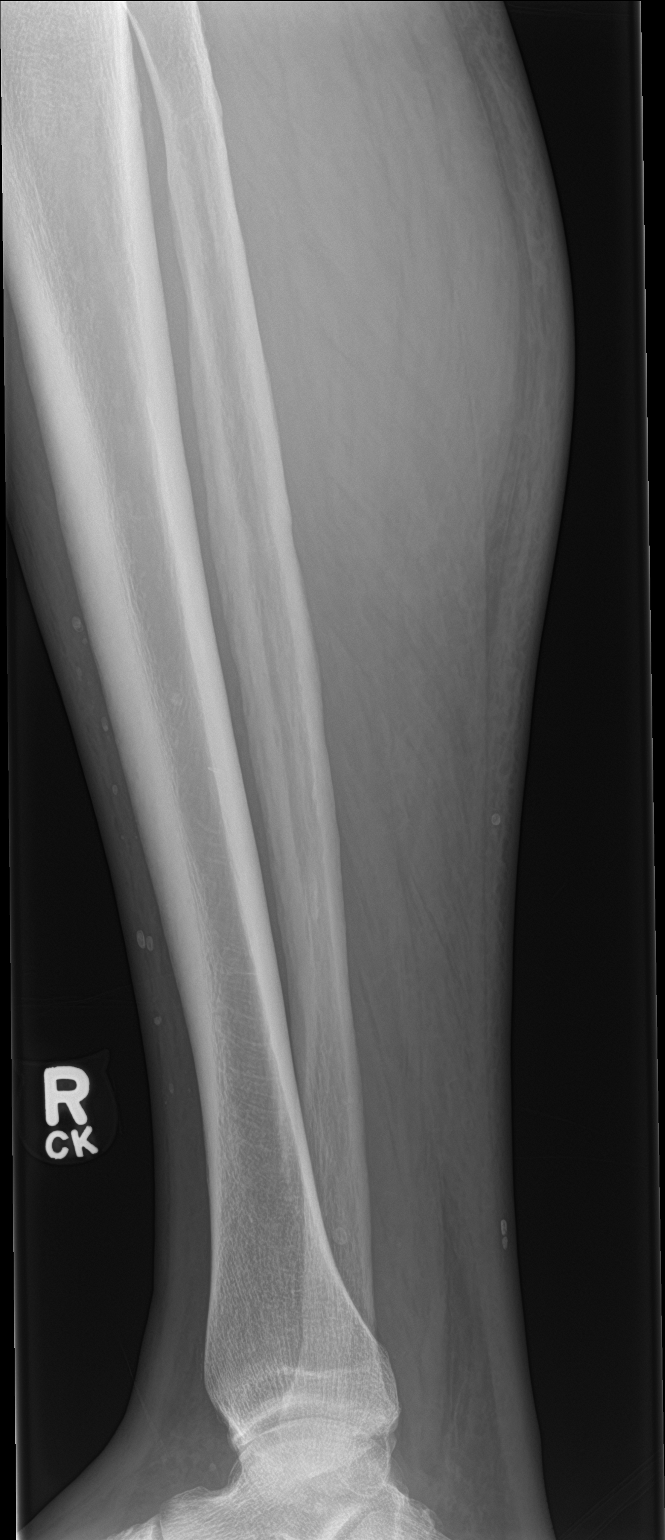

[tibia lat (2 of 2)]
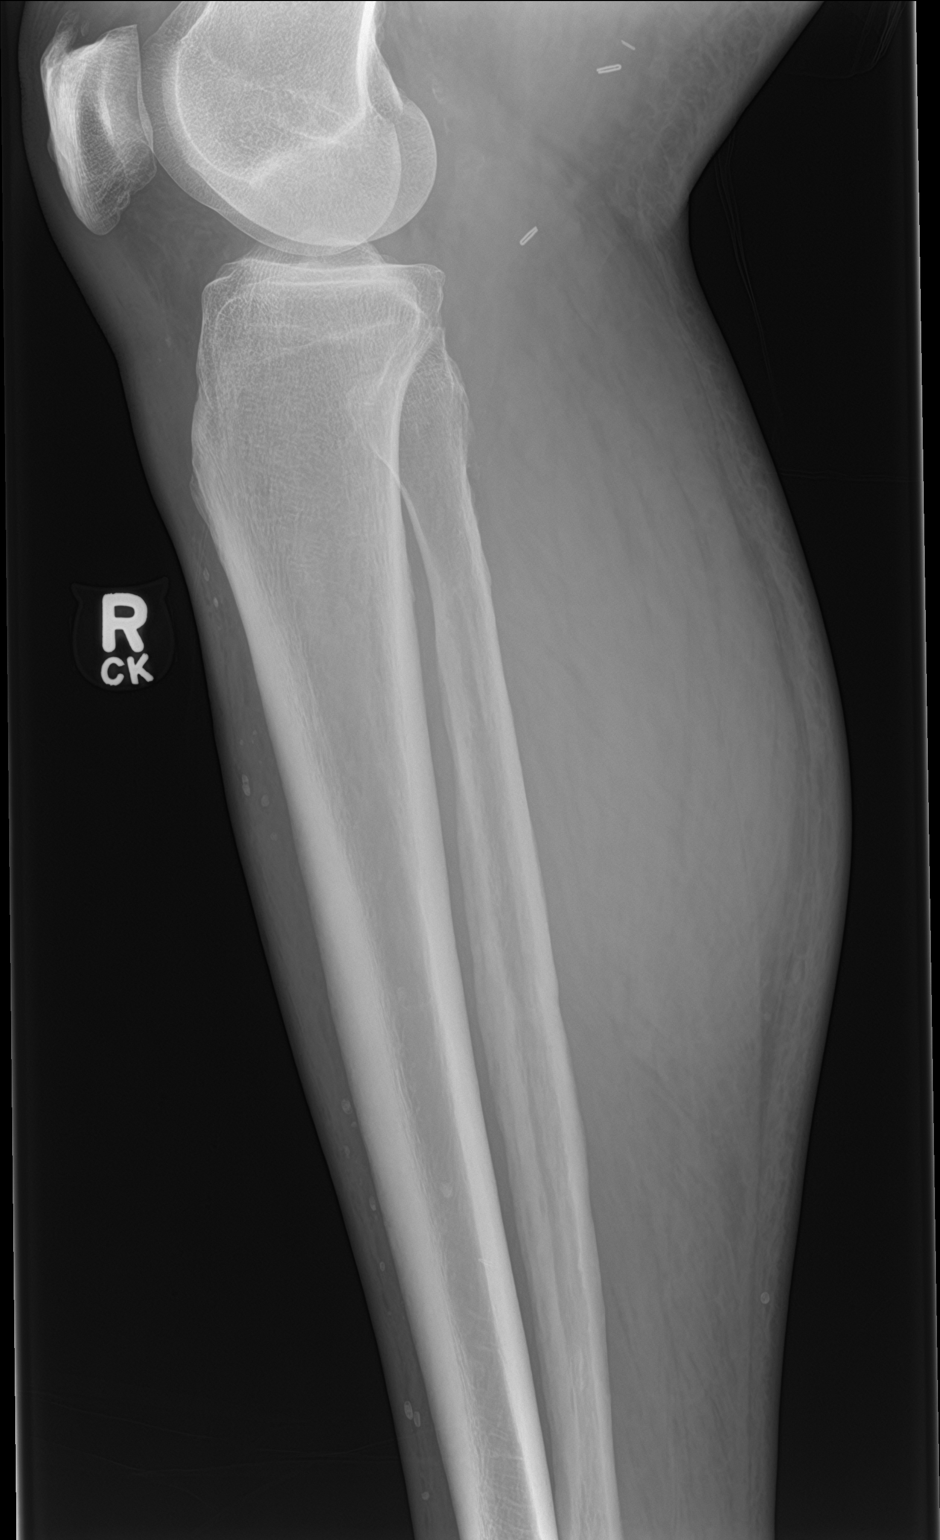

[4 of 4 positions shown; findings below may reference images not displayed]

FINDINGS: There is no evidence of fracture or other focal bone lesions. Soft
tissues are unremarkable.
IMPRESSION: Negative.

## 2020-08-24 ENCOUNTER — Other Ambulatory Visit: Payer: Self-pay | Admitting: Family Medicine

## 2020-09-03 ENCOUNTER — Other Ambulatory Visit: Payer: Self-pay | Admitting: Family Medicine

## 2020-09-05 ENCOUNTER — Encounter: Payer: Self-pay | Admitting: Family Medicine

## 2020-09-11 ENCOUNTER — Other Ambulatory Visit: Payer: Self-pay

## 2020-09-11 ENCOUNTER — Encounter (HOSPITAL_COMMUNITY): Payer: Self-pay | Admitting: Emergency Medicine

## 2020-09-11 DIAGNOSIS — Z5321 Procedure and treatment not carried out due to patient leaving prior to being seen by health care provider: Secondary | ICD-10-CM | POA: Diagnosis not present

## 2020-09-11 DIAGNOSIS — R519 Headache, unspecified: Secondary | ICD-10-CM | POA: Diagnosis not present

## 2020-09-11 LAB — CBG MONITORING, ED: Glucose-Capillary: 98 mg/dL (ref 70–99)
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
## 2020-09-12 ENCOUNTER — Emergency Department (HOSPITAL_COMMUNITY)
Admission: EM | Admit: 2020-09-12 | Discharge: 2020-09-12 | Disposition: A | Discharge status: Home / Self Care | Payer: Medicare HMO | Attending: Emergency Medicine | Admitting: Emergency Medicine

## 2020-09-17 ENCOUNTER — Encounter: Payer: Self-pay | Admitting: Family Medicine

## 2020-09-17 ENCOUNTER — Other Ambulatory Visit: Payer: Self-pay

## 2020-09-17 ENCOUNTER — Ambulatory Visit (INDEPENDENT_AMBULATORY_CARE_PROVIDER_SITE_OTHER): Payer: Medicare HMO | Admitting: Family Medicine

## 2020-09-17 VITALS — BP 165/84 | HR 79 | Temp 97.5°F | Resp 16 | Ht 73.5 in | Wt 208.2 lb

## 2020-09-17 DIAGNOSIS — E1149 Type 2 diabetes mellitus with other diabetic neurological complication: Secondary | ICD-10-CM | POA: Diagnosis not present

## 2020-09-17 DIAGNOSIS — R519 Headache, unspecified: Secondary | ICD-10-CM | POA: Diagnosis not present

## 2020-09-17 DIAGNOSIS — I10 Essential (primary) hypertension: Secondary | ICD-10-CM

## 2020-09-17 MED ORDER — TRAMADOL HCL 50 MG PO TABS: ORAL_TABLET | ORAL | 0 refills | Status: DC
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
## 2020-09-18 LAB — BASIC METABOLIC PANEL
BUN: 20 mg/dL (ref 6–23)
CO2: 25 mEq/L (ref 19–32)
Calcium: 9.7 mg/dL (ref 8.4–10.5)
Chloride: 102 mEq/L (ref 96–112)
Creatinine, Ser: 1.04 mg/dL (ref 0.40–1.50)
GFR: 74.99 mL/min (ref >60.00)
Glucose, Bld: 145 mg/dL — ABNORMAL HIGH (ref 70–99)
Potassium: 4.4 mEq/L (ref 3.5–5.1)
Sodium: 136 mEq/L (ref 135–145)

## 2020-09-19 LAB — HEMOGLOBIN A1C
Hgb A1c MFr Bld: 7.2 % of total Hgb — ABNORMAL HIGH (ref <5.7)
Mean Plasma Glucose: 160 mg/dL
eAG (mmol/L): 8.9 mmol/L

## 2020-10-01 ENCOUNTER — Ambulatory Visit: Payer: Medicare HMO | Admitting: Family Medicine

## 2020-10-01 NOTE — Progress Notes (Deleted)
OFFICE VISIT  10/01/2020  CC: No chief complaint on file.   HPI:    Patient is a [name] who presents for 2 week f/u HA's. A/P as of last visit: "1) Headaches, likely tension. No sign of intracranial pathology. No imaging indicated.   We'll try to break the cycle of daily HA lately and see how things go: tramadol 18m, 1-2 tid prn, #30, no RF. Therapeutic expectations and side effect profile of medication discussed today.  Patient's questions answered.  2) DM 2, home glucoses pretty good per his report today. Compliant with glipizide xl, victoza, and metformin-->continue. Hba1c and lytes/cr today.  3) HTN: elevated today but no home monitoring lately to compare to. Historically his bp has been well controlled on current regimen of losartan 1062mqd and lopressor 5046mid. Recommended he restart regular home bp monitoring. No change in meds today. May need to get more aggressive with bp control, particularly in light of problem #1 above."  INTERIM HX: A1c 7.2% last visit, no changes made.  HTN: home bp monitoring->***.  HA's: ***    Past Medical History:  Diagnosis Date  . Anxiety   . Colon cancer screening 02/2017   Pt declined colonoscopy, but agreed to cologuard screening--this test was POSITIVE on 03/11/17---f/u colonoscopy 01/28/18 w/adenomatous polyps-->recall 1 yr.  . Coronary artery disease    CABG 2014.  . Diabetes mellitus type 2 with complications [place]20DPN and microalbuminuria.  . Dupuytren's disease 09/2017   ortho 09/2017, L hand  . Erectile dysfunction    Dr. ManTresa Moorerol is also doing prostate ca screening).  Refractory to medications.  Pt likely to get penile implant as of urol f/u 01/2015.  . GJohney Mainematuria 2017   Pt is to bring this up with his urologist as of 06/2016.  . HMarland Kitchenperlipidemia   . Hypertension   . Increased prostate specific antigen (PSA) velocity 02/2018   PSA 1.7 to 2.7 from 02/2017 to 02/2018.  Repeat PSA 09/2018  was 3-->referral to urology done, pt did not go. Rpt PSA 11/2019 was 1.5. Holding off on urol ref..  . [facility]   CABG x 22 Jul 2013  . Obesity, Class I, BMI 30-34.9   . Paroxysmal atrial fibrillation ([place]/2014   Started post-op CABG, converted on amiodarone--amiodarone stopped after a couple months  . Stenosing tenosynovitis of thumb 2019   left; incarcerated in extension (ortho visit 09/2017--steroid injection). R steroid inj 06/2020    Past Surgical History:  Procedure Laterality Date  . CARDIOVASCULAR STRESS TEST  04/2020   NO ISCHEMIA.  LOW RISK.  EF 51% some septal hyokinesis.  . COLONOSCOPY W/ POLYPECTOMY  01/28/2018   Mild diverticulosis, small int hem.  Adenomatous polyp: recall 1 yr per GI  . CORONARY ARTERY BYPASS GRAFT N/A 08/02/2013   Procedure: CORONARY ARTERY BYPASS GRAFTING (CABG);  Surgeon: EdwGrace IsaacD;  [facility] [place];  Laterality: N/A;  Coronary artery bypass graft times five utilizing the left internal mammary artery and the right greater saphenous vein.  . EMarland KitchenDOVEIN HARVEST OF GREATER SAPHENOUS VEIN Right 08/02/2013   Procedure: ENDOVEIN HARVEST OF GREATER SAPHENOUS VEIN;  Surgeon: EdwGrace IsaacD;  [facility] [place];  Laterality: Right;  . INTRAOPERATIVE TRANSESOPHAGEAL ECHOCARDIOGRAM N/A 08/02/2013   Procedure: INTRAOPERATIVE TRANSESOPHAGEAL ECHOCARDIOGRAM;  Surgeon: EdwGrace IsaacD;  [facility] [place] [facility];  Laterality: N/A;.  EF 40%, LVH and LV dilatation, inf wall hypokinesis (at the time of his MI)  . LEFT HEART CATHETERIZATION WITH CORONARY ANGIOGRAM N/A 08/01/2013   Procedure: LEFT HEART CATHETERIZATION WITH CORONARY ANGIOGRAM;  Surgeon: Blane Ohara, MD;  Location[facility] CATH LAB;  Service: Cardiovascular;  Laterality: N/A;  . NASAL FRACTURE SURGERY  1979  . SHOULDER SURGERY  1983   left; pins are in place   . TRANSTHORACIC ECHOCARDIOGRAM  07/2013; 04/2020   2014 EF 40%+ wall motion abnl--in the context of acute NSTEMI. 04/2020 Akinesis of the basal inferior wall with overall low normal LV systolic function; EF 79-89%, grd I DD, valves fine.    Outpatient Medications Prior to Visit  Medication Sig Dispense Refill  . ACCU-CHEK AVIVA PLUS test strip USE  TO CHECK BLOOD SUGAR THREE TIMES DAILY 300 strip 1  . ACCU-CHEK SOFTCLIX LANCETS lancets Use as instructed 100 each 12  . albuterol (PROVENTIL HFA;VENTOLIN HFA) 108 (90 Base) MCG/ACT inhaler Inhale 2 puffs into the lungs every 4 (four) hours as needed for wheezing or shortness of breath. (Patient not taking: Reported on 09/17/2020) 1 Inhaler 1  . Alcohol Swabs (B-D SINGLE USE SWABS REGULAR) PADS USE TO CHECK BLOOD SUGAR THREE TIMES DAILY 300 each 1  . atorvastatin (LIPITOR) 20 MG tablet TAKE 1 TABLET DAILY AT 6 PM. 90 tablet 0  . Blood Glucose Calibration (ACCU-CHEK AVIVA) SOLN 1 Bottle by In Vitro route as needed. 1 each 0  . Blood Glucose Monitoring Suppl (ACCU-CHEK AVIVA PLUS) w/Device KIT USE  TO CHECK BLOOD SUGAR THREE TIMES DAILY 1 kit 0  . citalopram (CELEXA) 20 MG tablet Take 1 tablet (20 mg total) by mouth daily. 90 tablet 1  . clopidogrel (PLAVIX) 75 MG tablet TAKE 1 TABLET (75 MG TOTAL) BY MOUTH DAILY WITH BREAKFAST. 90 tablet 3  . gabapentin (NEURONTIN) 300 MG capsule TAKE 1 CAPSULE AT BEDTIME 90 capsule 1  . glipiZIDE (GLUCOTROL XL) 10 MG 24 hr tablet TAKE 1 TABLET EVERY DAY 90 tablet 1  . Insulin Pen Needle 31G X 8 MM MISC Use to inject Victoza SQ. 100 each 2  . LANCETS ULTRA THIN 30G MISC 1 Units by Does not apply route 4 (four) times daily. Breakfast, Lunch, Dinner, Bedtime 100 each 3  . liraglutide (VICTOZA) 18 MG/3ML SOPN 1.2 mg SQ qd 45 mL 6  . LORazepam (ATIVAN) 0.5 MG tablet 1-2 tabs po q8h prn anxiety 30 tablet 2  . losartan (COZAAR) 100 MG tablet Take 1 tablet (100 mg total) by mouth daily. TAKE 1 TABLET BY MOUTH EVERY DAY 90  tablet 1  . metFORMIN (GLUCOPHAGE) 1000 MG tablet Take 1 tablet (1,000 mg total) by mouth 2 (two) times daily with a meal. 180 tablet 3  . metoprolol tartrate (LOPRESSOR) 50 MG tablet TAKE 1 TABLET BY MOUTH TWICE A DAY 180 tablet 1  . nitroGLYCERIN (NITROSTAT) 0.4 MG SL tablet Place 1 tablet (0.4 mg total) under the tongue every 5 (five) minutes as needed for chest pain. (Patient not taking: Reported on 09/17/2020) 30 tablet 0  . traMADol (ULTRAM) 50 MG tablet 1-2 tabs po q6h prn headache 30 tablet 0  . traZODone (DESYREL) 50 MG tablet TAKE 1 TO 2 TABLETS AT BEDTIME AS NEEDED FOR INSOMNIA 180 tablet 1   No facility-administered medications prior to visit.    Allergies  Allergen Reactions  . Oxycodone Other (See Comments)    Psych/mental adverse effects  . Pioglitazone Swelling and Rash  ROS As per HPI  PE: Vitals with BMI 09/17/2020 09/11/2020 09/11/2020  Height 6' 1.5" - _0   Weight 208 lbs 3 oz - 210 lbs  BMI 83.43 - 73.57  Systolic 897 847 841  Diastolic 84 88 97  Pulse 79 67 72     ***  LABS:  Lab Results  Component Value Date   TSH 2.08 11/21/2019   Lab Results  Component Value Date   WBC 7.9 04/15/2020   HGB 13.5 04/15/2020   HCT 42.0 04/15/2020   MCV 94.2 04/15/2020   PLT 246 04/15/2020   Lab Results  Component Value Date   CREATININE 1.04 09/18/2020   BUN 20 09/18/2020   NA 136 09/18/2020   K 4.4 09/18/2020   CL 102 09/18/2020   CO2 25 09/18/2020   Lab Results  Component Value Date   ALT 33 10/19/2019   AST 26 10/19/2019   ALKPHOS 85 10/19/2019   BILITOT 0.4 10/19/2019   Lab Results  Component Value Date   CHOL 103 10/19/2019   Lab Results  Component Value Date   HDL 50 10/19/2019   Lab Results  Component Value Date   LDLCALC 37 10/19/2019   Lab Results  Component Value Date   TRIG 79 10/19/2019   Lab Results  Component Value Date   CHOLHDL 2.1 10/19/2019   Lab Results  Component Value Date   PSA 1.50 11/21/2019   PSA 3.04  09/24/2018   PSA 2.77 03/10/2018   Lab Results  Component Value Date   HGBA1C 7.2 (H) 09/17/2020   IMPRESSION AND PLAN:  No problem-specific Assessment & Plan notes found for this encounter.   An After Visit Summary was printed and given to the patient.  FOLLOW UP: No follow-ups on file.  Signed:  Crissie Sickles, MD           10/01/2020

## 2020-10-02 ENCOUNTER — Other Ambulatory Visit: Payer: Self-pay

## 2020-10-02 MED ORDER — ATORVASTATIN CALCIUM 20 MG PO TABS: ORAL_TABLET | ORAL | 0 refills | Status: DC

## 2020-10-08 ENCOUNTER — Encounter: Payer: Self-pay | Admitting: Family Medicine

## 2020-10-19 ENCOUNTER — Ambulatory Visit: Payer: Medicare HMO | Admitting: Cardiovascular Disease

## 2020-10-23 ENCOUNTER — Other Ambulatory Visit: Payer: Self-pay | Admitting: Family Medicine

## 2020-10-25 ENCOUNTER — Telehealth: Payer: Self-pay | Admitting: Family Medicine

## 2020-10-25 NOTE — Telephone Encounter (Signed)
Spoke with patient to schedule AWV appt. He hung up.    Called patient to schedule Annual Wellness Visit.  Please schedule with Nurse Health Advisor Caroleen Hamman, RN at [facility]

## 2020-11-04 ENCOUNTER — Encounter: Payer: Self-pay | Admitting: Cardiovascular Disease
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
## 2020-11-05 ENCOUNTER — Other Ambulatory Visit: Payer: Self-pay

## 2020-11-05 ENCOUNTER — Ambulatory Visit (INDEPENDENT_AMBULATORY_CARE_PROVIDER_SITE_OTHER): Payer: Medicare HMO | Admitting: Cardiovascular Disease

## 2020-11-05 ENCOUNTER — Encounter: Payer: Self-pay | Admitting: Cardiovascular Disease

## 2020-11-05 VITALS — BP 130/78 | HR 64 | Ht 74.0 in | Wt 215.0 lb

## 2020-11-05 DIAGNOSIS — I2510 Atherosclerotic heart disease of native coronary artery without angina pectoris: Secondary | ICD-10-CM

## 2020-11-05 NOTE — Patient Instructions (Signed)
Medication Instructions:  Your physician recommends that you continue on your current medications as directed. Please refer to the Current Medication list given to you today.  *If you need a refill on your cardiac medications before your next appointment, please call your pharmacy*   Lab Work: TODAY:Lipids, BMET, ALT If you have labs (blood work) drawn today and your tests are completely normal, you will receive your results only by: Marland Kitchen MyChart Message (if you have MyChart) OR . A paper copy in the mail If you have any lab test that is abnormal or we need to change your treatment, we will call you to review the results.   Testing/Procedures: none   Follow-Up: At [facility], you and your health needs are our priority.  As part of our continuing mission to provide you with exceptional heart care, we have created designated Provider Care Teams.  These Care Teams include your primary Cardiologist (physician) and Advanced Practice Providers (APPs -  Physician Assistants and Nurse Practitioners) who all work together to provide you with the care you need, when you need it.   Your next appointment:   1 year(s)  The format for your next appointment:   In Person  Provider:   You may see Mertie Moores, MD or one of the following Advanced Practice Providers on your designated Care Team:    Richardson Dopp, PA-C  [place], [place]

## 2020-11-06 LAB — LIPID PANEL
Chol/HDL Ratio: 2.3 ratio (ref 0.0–5.0)
Cholesterol, Total: 122 mg/dL (ref 100–199)
HDL: 53 mg/dL (ref >39)
LDL Chol Calc (NIH): 54 mg/dL (ref 0–99)
Triglycerides: 74 mg/dL (ref 0–149)
VLDL Cholesterol Cal: 15 mg/dL (ref 5–40)

## 2020-11-06 LAB — BASIC METABOLIC PANEL
BUN/Creatinine Ratio: 19 (ref 10–24)
BUN: 21 mg/dL (ref 8–27)
CO2: 22 mmol/L (ref 20–29)
Calcium: 9.3 mg/dL (ref 8.6–10.2)
Chloride: 102 mmol/L (ref 96–106)
Creatinine, Ser: 1.1 mg/dL (ref 0.76–1.27)
Glucose: 162 mg/dL — ABNORMAL HIGH (ref 65–99)
Potassium: 5.4 mmol/L — ABNORMAL HIGH (ref 3.5–5.2)
Sodium: 140 mmol/L (ref 134–144)
eGFR: 74 mL/min/{1.73_m2} (ref >59)

## 2020-11-06 LAB — ALT: ALT: 15 IU/L (ref 0–44)

## 2020-11-07 ENCOUNTER — Other Ambulatory Visit: Payer: Self-pay

## 2020-11-08 ENCOUNTER — Ambulatory Visit (INDEPENDENT_AMBULATORY_CARE_PROVIDER_SITE_OTHER): Payer: Medicare HMO | Admitting: Family Medicine

## 2020-11-08 ENCOUNTER — Encounter: Payer: Self-pay | Admitting: Family Medicine

## 2020-11-08 VITALS — BP 132/79 | HR 70 | Temp 97.7°F | Resp 16 | Ht 74.0 in | Wt 210.4 lb

## 2020-11-08 DIAGNOSIS — G4489 Other headache syndrome: Secondary | ICD-10-CM | POA: Diagnosis not present

## 2020-11-08 MED ORDER — LORAZEPAM 0.5 MG PO TABS: ORAL_TABLET | ORAL | 2 refills | Status: DC

## 2020-11-08 MED ORDER — TRAMADOL HCL 50 MG PO TABS: ORAL_TABLET | ORAL | 1 refills | Status: DC
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
## 2020-11-13 NOTE — Progress Notes (Signed)
Subjective:   Alex Yang is a 65 y.o. male who presents for Medicare Annual/Subsequent preventive examination.  I connected with Hieu today by telephone and verified that I am speaking with the correct person using two identifiers. Location patient: home Location provider: work Persons participating in the virtual visit: patient, Marine scientist.    I discussed the limitations, risks, security and privacy concerns of performing an evaluation and management service by telephone and the availability of in person appointments. I also discussed with the patient that there may be a patient responsible charge related to this service. The patient expressed understanding and verbally consented to this telephonic visit.    Interactive audio and video telecommunications were attempted between this provider and patient, however failed, due to patient having technical difficulties OR patient did not have access to video capability.  We continued and completed visit with audio only.  Some vital signs may be absent or patient reported.   Time Spent with patient on telephone encounter: 30 minutes   Review of Systems     Cardiac Risk Factors include: advanced age (>60mn, >>9women);diabetes mellitus;dyslipidemia;hypertension     Objective:    Today's Vitals   11/14/20 0821  Weight: 210 lb (95.3 kg)  Height: 6' 2"  (1.88 m)   Body mass index is 26.96 kg/m.  Advanced Directives 11/14/2020 09/11/2020 04/15/2020 01/28/2018 02/27/2017 07/30/2015 08/01/2013  Does Patient Have a Medical Advance Directive? No No No No No No -  Would patient like information on creating a medical advance directive? Yes (MAU/Ambulatory/Procedural Areas - Information given) - - - Yes (MAU/Ambulatory/Procedural Areas - Information given) No - patient declined information -  Pre-existing out of facility DNR order (yellow form or pink MOST form) - - - - - - No    Current Medications (verified) Outpatient Encounter Medications as  of 11/14/2020  Medication Sig  . ACCU-CHEK AVIVA PLUS test strip USE  TO CHECK BLOOD SUGAR THREE TIMES DAILY  . ACCU-CHEK SOFTCLIX LANCETS lancets Use as instructed  . albuterol (PROVENTIL HFA;VENTOLIN HFA) 108 (90 Base) MCG/ACT inhaler Inhale 2 puffs into the lungs every 4 (four) hours as needed for wheezing or shortness of breath.  . Alcohol Swabs (B-D SINGLE USE SWABS REGULAR) PADS USE TO CHECK BLOOD SUGAR THREE TIMES DAILY  . atorvastatin (LIPITOR) 20 MG tablet TAKE 1 TABLET DAILY AT 6PM.  . Blood Glucose Calibration (ACCU-CHEK AVIVA) SOLN 1 Bottle by In Vitro route as needed.  . Blood Glucose Monitoring Suppl (ACCU-CHEK AVIVA PLUS) w/Device KIT USE  TO CHECK BLOOD SUGAR THREE TIMES DAILY  . citalopram (CELEXA) 20 MG tablet Take 1 tablet (20 mg total) by mouth daily.  . clopidogrel (PLAVIX) 75 MG tablet TAKE 1 TABLET (75 MG TOTAL) BY MOUTH DAILY WITH BREAKFAST.  .Marland Kitchengabapentin (NEURONTIN) 300 MG capsule TAKE 1 CAPSULE AT BEDTIME  . glipiZIDE (GLUCOTROL XL) 10 MG 24 hr tablet TAKE 1 TABLET EVERY DAY  . Insulin Pen Needle 31G X 8 MM MISC Use to inject Victoza SQ.  .Marland KitchenLANCETS ULTRA THIN 30G MISC 1 Units by Does not apply route 4 (four) times daily. Breakfast, Lunch, Dinner, Bedtime  . liraglutide (VICTOZA) 18 MG/3ML SOPN 1.2 mg SQ qd  . LORazepam (ATIVAN) 0.5 MG tablet 1-2 tabs po q8h prn anxiety  . losartan (COZAAR) 100 MG tablet TAKE 1 TABLET BY MOUTH ONCE DAILY  . metFORMIN (GLUCOPHAGE) 1000 MG tablet Take 1 tablet (1,000 mg total) by mouth 2 (two) times daily with a meal.  .  metoprolol tartrate (LOPRESSOR) 50 MG tablet TAKE 1 TABLET BY MOUTH TWICE A DAY  . traMADol (ULTRAM) 50 MG tablet 1-2 tabs po q6h prn headache  . traZODone (DESYREL) 50 MG tablet TAKE 1 TO 2 TABLETS AT BEDTIME AS NEEDED FOR INSOMNIA  . nitroGLYCERIN (NITROSTAT) 0.4 MG SL tablet Place 1 tablet (0.4 mg total) under the tongue every 5 (five) minutes as needed for chest pain. (Patient not taking: No sig reported)   No  facility-administered encounter medications on file as of 11/14/2020.    Allergies (verified) Oxycodone and Pioglitazone   History: Past Medical History:  Diagnosis Date  . Anxiety   . Colon cancer screening 02/2017   Pt declined colonoscopy, but agreed to cologuard screening--this test was POSITIVE on 03/11/17---f/u colonoscopy 01/28/18 w/adenomatous polyps-->recall 1 yr.  . Coronary artery disease    CABG 2014.  . Diabetes mellitus type 2 with complications ([place]) 67/6720   DPN and microalbuminuria.  . Dupuytren's disease 09/2017   ortho 09/2017, L hand  . Erectile dysfunction    Dr. Tresa Moore (urol is also doing prostate ca screening).  Refractory to medications.  Pt likely to get penile implant as of urol f/u 01/2015.  Johney Maine hematuria 2017   Pt is to bring this up with his urologist as of 06/2016.  Marland Kitchen Hyperlipidemia   . Hypertension   . Increased prostate specific antigen (PSA) velocity 02/2018   PSA 1.7 to 2.7 from 02/2017 to 02/2018.  Repeat PSA 09/2018 was 3-->referral to urology done, pt did not go. Rpt PSA 11/2019 was 1.5. Holding off on urol ref..  . NSTEMI (non-ST elevated myocardial infarction) [facility]) 07/2013   CABG x 22 Jul 2013  . Obesity, Class I, BMI 30-34.9   . Paroxysmal atrial fibrillation ([place]) 07/2013   Started post-op CABG, converted on amiodarone--amiodarone stopped after a couple months  . Stenosing tenosynovitis of thumb 2019   left; incarcerated in extension (ortho visit 09/2017--steroid injection). R steroid inj 06/2020   Past Surgical History:  Procedure Laterality Date  . CARDIOVASCULAR STRESS TEST  04/2020   NO ISCHEMIA.  LOW RISK.  EF 51% some septal hyokinesis.  . COLONOSCOPY W/ POLYPECTOMY  01/28/2018   Mild diverticulosis, small int hem.  Adenomatous polyp: recall 1 yr per GI  . CORONARY ARTERY BYPASS GRAFT N/A 08/02/2013   Procedure: CORONARY ARTERY BYPASS GRAFTING (CABG);  Surgeon: Grace Isaac, MD;  Location: [facility];  Service: Open Heart Surgery;   Laterality: N/A;  Coronary artery bypass graft times five utilizing the left internal mammary artery and the right greater saphenous vein.  Marland Kitchen ENDOVEIN HARVEST OF GREATER SAPHENOUS VEIN Right 08/02/2013   Procedure: ENDOVEIN HARVEST OF GREATER SAPHENOUS VEIN;  Surgeon: Grace Isaac, MD;  Location: [facility];  Service: Open Heart Surgery;  Laterality: Right;  . INTRAOPERATIVE TRANSESOPHAGEAL ECHOCARDIOGRAM N/A 08/02/2013   Procedure: INTRAOPERATIVE TRANSESOPHAGEAL ECHOCARDIOGRAM;  Surgeon: Grace Isaac, MD;  Location: [place];  Service: Open Heart Surgery;  Laterality: N/A;.  EF 40%, LVH and LV dilatation, inf wall hypokinesis (at the time of his MI)  . LEFT HEART CATHETERIZATION WITH CORONARY ANGIOGRAM N/A 08/01/2013   Procedure: LEFT HEART CATHETERIZATION WITH CORONARY ANGIOGRAM;  Surgeon: Blane Ohara, MD;  Location: [facility] CATH LAB;  Service: Cardiovascular;  Laterality: N/A;  . NASAL FRACTURE SURGERY  1979  . SHOULDER SURGERY  1983   left; pins are in place  . TRANSTHORACIC ECHOCARDIOGRAM  07/2013; 04/2020   2014 EF 40%+ wall motion abnl--in the  context of acute NSTEMI. 04/2020 Akinesis of the basal inferior wall with overall low normal LV systolic function; EF 34-19%, grd I DD, valves fine.   Family History  Problem Relation Age of Onset  . Heart attack Mother   . Heart disease Mother   . Diabetes Mother   . Heart disease Father   . Cancer Brother        lung and liver  . Colon cancer Neg Hx   . Rectal cancer Neg Hx    Social History   Socioeconomic History  . Marital status: Divorced    Spouse name: Not on file  . Number of children: Not on file  . Years of education: Not on file  . Highest education level: Not on file  Occupational History  . Occupation: Physicist, medical  Tobacco Use  . Smoking status: Never Smoker  . Smokeless tobacco: Never Used  Vaping Use  . Vaping Use: Never used  Substance and Sexual Activity  . Alcohol use: No    Alcohol/week: 0.0 standard drinks   . Drug use: No  . Sexual activity: Not on file  Other Topics Concern  . Not on file  Social History Narrative   Married.  Separated 2020.   Has 3 biologic children.   Occupation:  Theme park manager (self employed).   Education: HS   No tobacco, alc, or drugs   Social Determinants of Health   Financial Resource Strain: Low Risk   . Difficulty of Paying Living Expenses: Not hard at all  Food Insecurity: No Food Insecurity  . Worried About Charity fundraiser in the Last Year: Never true  . Ran Out of Food in the Last Year: Never true  Transportation Needs: No Transportation Needs  . Lack of Transportation (Medical): No  . Lack of Transportation (Non-Medical): No  Physical Activity: Inactive  . Days of Exercise per Week: 0 days  . Minutes of Exercise per Session: 0 min  Stress: No Stress Concern Present  . Feeling of Stress : Not at all  Social Connections: Socially Isolated  . Frequency of Communication with Friends and Family: More than three times a week  . Frequency of Social Gatherings with Friends and Family: Once a week  . Attends Religious Services: Never  . Active Member of Clubs or Organizations: No  . Attends Archivist Meetings: Never  . Marital Status: Divorced    Tobacco Counseling Counseling given: Not Answered   Clinical Intake:  Pre-visit preparation completed: Yes  Pain : No/denies pain     Nutritional Status: BMI 25 -29 Overweight Nutritional Risks: None Diabetes: Yes CBG done?: No Did pt. bring in CBG monitor from home?: No  How often do you need to have someone help you when you read instructions, pamphlets, or other written materials from your doctor or pharmacy?: 1 - Never Diabetes:  Is the patient diabetic?  Yes  If diabetic, was a CBG obtained today?  No  Did the patient bring in their glucometer from home?  No phone visit How often do you monitor your CBG's? occasionally.   Financial Strains and Diabetes Management:  Are you  having any financial strains with the device, your supplies or your medication? No .  Does the patient want to be seen by Chronic Care Management for management of their diabetes?  No  Would the patient like to be referred to a Nutritionist or for Diabetic Management?  No   Diabetic Exams:  Diabetic Eye Exam: . Overdue  for diabetic eye exam. Pt has been advised about the importance in completing this exam. Patient has an appt scheduled  Diabetic Foot Exam: Completed 02/21/2020.     Interpreter Needed?: No  Information entered by :: Caroleen Hamman LPN   Activities of Daily Living In your present state of health, do you have any difficulty performing the following activities: 11/14/2020 04/26/2020  Hearing? N N  Vision? N N  Difficulty concentrating or making decisions? N N  Walking or climbing stairs? N N  Dressing or bathing? N N  Doing errands, shopping? N N  Preparing Food and eating ? N -  Using the Toilet? N -  In the past six months, have you accidently leaked urine? N -  Do you have problems with loss of bowel control? N -  Managing your Medications? N -  Managing your Finances? N -  Housekeeping or managing your Housekeeping? N -  Some recent data might be hidden    Patient Care Team: Tammi Sou, MD as PCP - General (Family Medicine) Nahser, Wonda Cheng, MD as PCP - Cardiology (Cardiology) Alexis Frock, MD as Consulting Physician (Urology) Nahser, Wonda Cheng, MD as Consulting Physician (Cardiology) Milly Jakob, MD as Consulting Physician (Orthopedic Surgery) Pyrtle, Lajuan Lines, MD as Consulting Physician (Gastroenterology)  Indicate any recent Medical Services you may have received from other than [facility] providers in the past year (date may be approximate).     Assessment:   This is a routine wellness examination for Alex Yang.  Hearing/Vision screen  Hearing Screening   125Hz  250Hz  500Hz  1000Hz  2000Hz  3000Hz  4000Hz  6000Hz  8000Hz   Right ear:           Left ear:            Comments: No issues  Vision Screening Comments: Wears glasses Last eye exam-2021-[facility]  Dietary issues and exercise activities discussed: Current Exercise Habits: The patient has a physically strenuous job, but has no regular exercise apart from work., Exercise limited by: None identified  Goals      Patient Stated   .  <enter goal here> (pt-stated)      Stay healthy      Other   .  Patient Stated      Continue drinking water & eating healthy      Depression Screen PHQ 2/9 Scores 11/14/2020 11/08/2020 09/24/2018 06/23/2018 03/10/2018 12/14/2017 06/01/2017  PHQ - 2 Score 0 0 0 0 0 0 1  PHQ- 9 Score - - 1 0 0 0 6  Exception Documentation - - - - - - -    Fall Risk Fall Risk  11/14/2020 04/26/2020 02/27/2017 07/30/2015  Falls in the past year? 1 0 No No  Number falls in past yr: 0 0 - -  Injury with Fall? 0 0 - -  Risk for fall due to : - - - Other (Comment)  Follow up Falls prevention discussed Falls evaluation completed - -    FALL RISK PREVENTION PERTAINING TO THE HOME:  Any stairs in or around the home? Yes  If so, are there any without handrails? No  Home free of loose throw rugs in walkways, pet beds, electrical cords, etc? Yes  Adequate lighting in your home to reduce risk of falls? Yes   ASSISTIVE DEVICES UTILIZED TO PREVENT FALLS:  Life alert? No  Use of a cane, walker or w/c? No  Grab bars in the bathroom? No  Shower chair or bench in shower? No  Elevated toilet seat  or a handicapped toilet? No   TIMED UP AND GO:  Was the test performed? No . Phone visit   Cognitive Function:Normal cognitive status assessed by this Nurse Health Advisor. No abnormalities found.          Immunizations Immunization History  Administered Date(s) Administered  . Fluad Quad(high Dose 65+) 06/12/2020  . Influenza Split 05/09/2016, 05/12/2017  . Influenza, High Dose Seasonal PF 08/04/2019  . Influenza,inj,Quad PF,6+ Mos 07/12/2014, 07/19/2015, 04/28/2018  .  PFIZER(Purple Top)SARS-COV-2 Vaccination 10/27/2019, 11/13/2019  . Pneumococcal Polysaccharide-23 11/17/2013, 11/21/2019  . Tdap 03/08/2014  . Zoster Recombinat (Shingrix) 03/19/2017, 07/14/2017    TDAP status: Up to date  Flu Vaccine status: Up to date  Pneumococcal vaccine status: Up to date  Covid-19 vaccine status: Information provided on how to obtain vaccines. Booster due  Qualifies for Shingles Vaccine? No   Zostavax completed No   Shingrix Completed?: Yes  Screening Tests Health Maintenance  Topic Date Due  . COLONOSCOPY (Pts 45-79yr Insurance coverage will need to be confirmed)  01/29/2019  . OPHTHALMOLOGY EXAM  07/21/2019  . COVID-19 Vaccine (3 - Booster for Pfizer series) 11/15/2020 (Originally 05/15/2020)  . PNA vac Low Risk Adult (2 of 2 - PCV13) 11/20/2020  . FOOT EXAM  02/20/2021  . HEMOGLOBIN A1C  03/17/2021  . TETANUS/TDAP  03/08/2024  . INFLUENZA VACCINE  Completed  . Hepatitis C Screening  Completed  . HPV VACCINES  Aged Out    Health Maintenance  Health Maintenance Due  Topic Date Due  . COLONOSCOPY (Pts 45-49yrInsurance coverage will need to be confirmed)  01/29/2019  . OPHTHALMOLOGY EXAM  07/21/2019    Colorectal cancer screening: Referral to GI placed today. Pt aware the office will call re: appt.  Lung Cancer Screening: (Low Dose CT Chest recommended if Age 67-80ears, 30 pack-year currently smoking OR have quit w/in 15years.) does not qualify.    Additional Screening:  Hepatitis C Screening:Completed 10/17/2015  Vision Screening: Recommended annual ophthalmology exams for early detection of glaucoma and other disorders of the eye. Is the patient up to date with their annual eye exam?  No  Who is the provider or what is the name of the office in which the patient attends annual eye exams? [facility]   Dental Screening: Recommended annual dental exams for proper oral hygiene  Community Resource Referral / Chronic Care Management: CRR  required this visit?  No   CCM required this visit?  No      Plan:     I have personally reviewed and noted the following in the patient's chart:   . Medical and social history . Use of alcohol, tobacco or illicit drugs  . Current medications and supplements . Functional ability and status . Nutritional status . Physical activity . Advanced directives . List of other physicians . Hospitalizations, surgeries, and ER visits in previous 12 months . Vitals . Screenings to include cognitive, depression, and falls . Referrals and appointments  In addition, I have reviewed and discussed with patient certain preventive protocols, quality metrics, and best practice recommendations. A written personalized care plan for preventive services as well as general preventive health recommendations were provided to patient.   Due to this being a telephonic visit, the after visit summary with patients personalized plan was offered to patient via mail or my-chart. Patient would like to access on my-chart.   MaMarta AntuLPN   10/19/39/9622Nurse Health Advisor  Nurse Notes: none

## 2020-11-14 ENCOUNTER — Ambulatory Visit (INDEPENDENT_AMBULATORY_CARE_PROVIDER_SITE_OTHER): Payer: Medicare HMO

## 2020-11-14 VITALS — Ht 74.0 in | Wt 210.0 lb

## 2020-11-14 DIAGNOSIS — Z0000 Encounter for general adult medical examination without abnormal findings: Secondary | ICD-10-CM | POA: Diagnosis not present

## 2020-11-14 DIAGNOSIS — Z1211 Encounter for screening for malignant neoplasm of colon: Secondary | ICD-10-CM

## 2020-11-14 NOTE — Patient Instructions (Signed)
Alex Yang , Thank you for taking time to complete your Medicare Wellness Visit. I appreciate your ongoing commitment to your health goals. Please review the following plan we discussed and let me know if I can assist you in the future.   Screening recommendations/referrals: Colonoscopy: Referral ordered today. Someone will be calling you to schedule. Recommended yearly ophthalmology/optometry visit for glaucoma screening and checkup Recommended yearly dental visit for hygiene and checkup  Vaccinations: Influenza vaccine: Up to date Pneumococcal vaccine: Up to date Tdap vaccine: Up to date-Due 03/08/2024 Shingles vaccine: Completed vaccines  Covid-19: Booster due-Discuss with pharmacy.  Advanced directives: Information mailed today.  Conditions/risks identified: See problem list  Next appointment: Follow up in one year for your annual wellness visit.   Preventive Care 67 Years and Older, Male Preventive care refers to lifestyle choices and visits with your health care provider that can promote health and wellness. What does preventive care include?  A yearly physical exam. This is also called an annual well check.  Dental exams once or twice a year.  Routine eye exams. Ask your health care provider how often you should have your eyes checked.  Personal lifestyle choices, including:  Daily care of your teeth and gums.  Regular physical activity.  Eating a healthy diet.  Avoiding tobacco and drug use.  Limiting alcohol use.  Practicing safe sex.  Taking low doses of aspirin every day.  Taking vitamin and mineral supplements as recommended by your health care provider. What happens during an annual well check? The services and screenings done by your health care provider during your annual well check will depend on your age, overall health, lifestyle risk factors, and family history of disease. Counseling  Your health care provider may ask you questions about  your:  Alcohol use.  Tobacco use.  Drug use.  Emotional well-being.  Home and relationship well-being.  Sexual activity.  Eating habits.  History of falls.  Memory and ability to understand (cognition).  Work and work Statistician. Screening  You may have the following tests or measurements:  Height, weight, and BMI.  Blood pressure.  Lipid and cholesterol levels. These may be checked every 5 years, or more frequently if you are over 92 years old.  Skin check.  Lung cancer screening. You may have this screening every year starting at age 49 if you have a 30-pack-year history of smoking and currently smoke or have quit within the past 15 years.  Fecal occult blood test (FOBT) of the stool. You may have this test every year starting at age 47.  Flexible sigmoidoscopy or colonoscopy. You may have a sigmoidoscopy every 5 years or a colonoscopy every 10 years starting at age 5.  Prostate cancer screening. Recommendations will vary depending on your family history and other risks.  Hepatitis C blood test.  Hepatitis B blood test.  Sexually transmitted disease (STD) testing.  Diabetes screening. This is done by checking your blood sugar (glucose) after you have not eaten for a while (fasting). You may have this done every 1-3 years.  Abdominal aortic aneurysm (AAA) screening. You may need this if you are a current or former smoker.  Osteoporosis. You may be screened starting at age 1 if you are at high risk. Talk with your health care provider about your test results, treatment options, and if necessary, the need for more tests. Vaccines  Your health care provider may recommend certain vaccines, such as:  Influenza vaccine. This is recommended every year.  Tetanus,  diphtheria, and acellular pertussis (Tdap, Td) vaccine. You may need a Td booster every 10 years.  Zoster vaccine. You may need this after age 28.  Pneumococcal 13-valent conjugate (PCV13) vaccine.  One dose is recommended after age 43.  Pneumococcal polysaccharide (PPSV23) vaccine. One dose is recommended after age 21. Talk to your health care provider about which screenings and vaccines you need and how often you need them. This information is not intended to replace advice given to you by your health care provider. Make sure you discuss any questions you have with your health care provider. Document Released: 08/31/2015 Document Revised: 04/23/2016 Document Reviewed: 06/05/2015 Elsevier Interactive Patient Education  2017 [employer] Prevention in the Home Falls can cause injuries. They can happen to people of all ages. There are many things you can do to make your home safe and to help prevent falls. What can I do on the outside of my home?  Regularly fix the edges of walkways and driveways and fix any cracks.  Remove anything that might make you trip as you walk through a door, such as a raised step or threshold.  Trim any bushes or trees on the path to your home.  Use bright outdoor lighting.  Clear any walking paths of anything that might make someone trip, such as rocks or tools.  Regularly check to see if handrails are loose or broken. Make sure that both sides of any steps have handrails.  Any raised decks and porches should have guardrails on the edges.  Have any leaves, snow, or ice cleared regularly.  Use sand or salt on walking paths during winter.  Clean up any spills in your garage right away. This includes oil or grease spills. What can I do in the bathroom?  Use night lights.  Install grab bars by the toilet and in the tub and shower. Do not use towel bars as grab bars.  Use non-skid mats or decals in the tub or shower.  If you need to sit down in the shower, use a plastic, non-slip stool.  Keep the floor dry. Clean up any water that spills on the floor as soon as it happens.  Remove soap buildup in the tub or shower regularly.  Attach bath  mats securely with double-sided non-slip rug tape.  Do not have throw rugs and other things on the floor that can make you trip. What can I do in the bedroom?  Use night lights.  Make sure that you have a light by your bed that is easy to reach.  Do not use any sheets or blankets that are too big for your bed. They should not hang down onto the floor.  Have a firm chair that has side arms. You can use this for support while you get dressed.  Do not have throw rugs and other things on the floor that can make you trip. What can I do in the kitchen?  Clean up any spills right away.  Avoid walking on wet floors.  Keep items that you use a lot in easy-to-reach places.  If you need to reach something above you, use a strong step stool that has a grab bar.  Keep electrical cords out of the way.  Do not use floor polish or wax that makes floors slippery. If you must use wax, use non-skid floor wax.  Do not have throw rugs and other things on the floor that can make you trip. What can I do with  my stairs?  Do not leave any items on the stairs.  Make sure that there are handrails on both sides of the stairs and use them. Fix handrails that are broken or loose. Make sure that handrails are as long as the stairways.  Check any carpeting to make sure that it is firmly attached to the stairs. Fix any carpet that is loose or worn.  Avoid having throw rugs at the top or bottom of the stairs. If you do have throw rugs, attach them to the floor with carpet tape.  Make sure that you have a light switch at the top of the stairs and the bottom of the stairs. If you do not have them, ask someone to add them for you. What else can I do to help prevent falls?  Wear shoes that:  Do not have high heels.  Have rubber bottoms.  Are comfortable and fit you well.  Are closed at the toe. Do not wear sandals.  If you use a stepladder:  Make sure that it is fully opened. Do not climb a closed  stepladder.  Make sure that both sides of the stepladder are locked into place.  Ask someone to hold it for you, if possible.  Clearly mark and make sure that you can see:  Any grab bars or handrails.  First and last steps.  Where the edge of each step is.  Use tools that help you move around (mobility aids) if they are needed. These include:  Canes.  Walkers.  Scooters.  Crutches.  Turn on the lights when you go into a dark area. Replace any light bulbs as soon as they burn out.  Set up your furniture so you have a clear path. Avoid moving your furniture around.  If any of your floors are uneven, fix them.  If there are any pets around you, be aware of where they are.  Review your medicines with your doctor. Some medicines can make you feel dizzy. This can increase your chance of falling. Ask your doctor what other things that you can do to help prevent falls. This information is not intended to replace advice given to you by your health care provider. Make sure you discuss any questions you have with your health care provider. Document Released: 05/31/2009 Document Revised: 01/10/2016 Document Reviewed: 09/08/2014 Elsevier Interactive Patient Education  2017 [employer].

## 2020-12-07 ENCOUNTER — Other Ambulatory Visit: Payer: Self-pay | Admitting: Family Medicine

## 2020-12-19 ENCOUNTER — Other Ambulatory Visit: Payer: Self-pay | Admitting: Family Medicine

## 2020-12-20 ENCOUNTER — Ambulatory Visit: Payer: Medicare HMO | Admitting: Family Medicine
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
## 2021-01-13 ENCOUNTER — Other Ambulatory Visit: Payer: Self-pay | Admitting: Family Medicine

## 2021-01-15 ENCOUNTER — Other Ambulatory Visit: Payer: Self-pay | Admitting: Cardiovascular Disease

## 2021-01-24 ENCOUNTER — Ambulatory Visit: Payer: Medicare HMO | Admitting: Family Medicine
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
## 2021-02-07 ENCOUNTER — Other Ambulatory Visit: Payer: Self-pay

## 2021-02-07 ENCOUNTER — Encounter: Payer: Self-pay | Admitting: Family Medicine

## 2021-02-07 ENCOUNTER — Ambulatory Visit (INDEPENDENT_AMBULATORY_CARE_PROVIDER_SITE_OTHER): Payer: Medicare HMO | Admitting: Family Medicine

## 2021-02-07 VITALS — BP 135/80 | HR 66 | Temp 97.6°F | Resp 16 | Ht 74.0 in | Wt 205.8 lb

## 2021-02-07 DIAGNOSIS — I10 Essential (primary) hypertension: Secondary | ICD-10-CM | POA: Diagnosis not present

## 2021-02-07 DIAGNOSIS — E7800 Pure hypercholesterolemia, unspecified: Secondary | ICD-10-CM

## 2021-02-07 DIAGNOSIS — F411 Generalized anxiety disorder: Secondary | ICD-10-CM

## 2021-02-07 DIAGNOSIS — G44329 Chronic post-traumatic headache, not intractable: Secondary | ICD-10-CM | POA: Diagnosis not present

## 2021-02-07 DIAGNOSIS — E1140 Type 2 diabetes mellitus with diabetic neuropathy, unspecified: Secondary | ICD-10-CM | POA: Diagnosis not present

## 2021-02-07 DIAGNOSIS — E1129 Type 2 diabetes mellitus with other diabetic kidney complication: Secondary | ICD-10-CM

## 2021-02-07 DIAGNOSIS — R809 Proteinuria, unspecified: Secondary | ICD-10-CM

## 2021-02-07 LAB — BASIC METABOLIC PANEL
BUN: 19 mg/dL (ref 6–23)
CO2: 27 mEq/L (ref 19–32)
Calcium: 8.7 mg/dL (ref 8.4–10.5)
Chloride: 104 mEq/L (ref 96–112)
Creatinine, Ser: 1.14 mg/dL (ref 0.40–1.50)
GFR: 66.99 mL/min (ref >60.00)
Glucose, Bld: 119 mg/dL — ABNORMAL HIGH (ref 70–99)
Potassium: 4.3 mEq/L (ref 3.5–5.1)
Sodium: 139 mEq/L (ref 135–145)

## 2021-02-07 LAB — HEMOGLOBIN A1C: Hgb A1c MFr Bld: 7.8 % — ABNORMAL HIGH (ref 4.6–6.5)

## 2021-02-07 MED ORDER — LORAZEPAM 0.5 MG PO TABS: ORAL_TABLET | ORAL | 2 refills | Status: DC
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
## 2021-02-08 ENCOUNTER — Other Ambulatory Visit: Payer: Self-pay | Admitting: Family Medicine

## 2021-02-11 MED ORDER — EMPAGLIFLOZIN 10 MG PO TABS: 10.0000 mg | ORAL_TABLET | Freq: Every day | ORAL | 3 refills | Status: DC

## 2021-02-11 NOTE — Telephone Encounter (Signed)
I sent in jardiance 10mg  once a day. Tell him not to take glipizide anymore at all. Continue metformin and victoza as-is.

## 2021-02-11 NOTE — Telephone Encounter (Signed)
Please see below and advise on medication recommendations.

## 2021-02-13 ENCOUNTER — Other Ambulatory Visit: Payer: Self-pay

## 2021-02-13 MED ORDER — TRUE METRIX BLOOD GLUCOSE TEST VI STRP: ORAL_STRIP | 12 refills | Status: DC

## 2021-02-13 MED ORDER — TRUEPLUS LANCETS 33G MISC: 1.0000 | Freq: Two times a day (BID) | 3 refills | Status: AC

## 2021-02-13 MED ORDER — TRUE METRIX GO GLUCOSE METER W/DEVICE KIT: 1.0000 | PACK | Freq: Once | 1 refills | Status: AC

## 2021-02-20 ENCOUNTER — Other Ambulatory Visit: Payer: Self-pay

## 2021-02-20 MED ORDER — BD SWAB SINGLE USE REGULAR PADS: MEDICATED_PAD | 1 refills | Status: AC

## 2021-02-25 ENCOUNTER — Encounter: Payer: Self-pay | Admitting: Family Medicine

## 2021-02-28 ENCOUNTER — Encounter: Payer: Self-pay | Admitting: Family Medicine

## 2021-03-04 ENCOUNTER — Telehealth: Payer: Self-pay | Admitting: Family Medicine

## 2021-03-04 NOTE — Telephone Encounter (Signed)
Patient called (with large dog barking loudly in the background) to let Dr. Anitra Lauth know that his headaches have gotten worse. He stated he does not have a request now, just wanted it on record that headaches are worse lately.

## 2021-03-04 NOTE — Telephone Encounter (Signed)
I don't recommend he use goody powder more than 3 times a WEEK for his HA's. If needed, he should arrange appt with me for f/u of this problem.  He no showed the last 2 appts->can you ask him if he received the letters the office sent about this?-thx

## 2021-03-04 NOTE — Telephone Encounter (Signed)
Pt informed of recommendations,voiced understanding.  He did receive letters sent from our office regarding last 2 no show appts.

## 2021-03-04 NOTE — Telephone Encounter (Signed)
Noted  

## 2021-03-04 NOTE — Telephone Encounter (Signed)
Pt last seen 02/07/21, uses tramadol prn for HA but ran out. He feels groggy the morning after taking med. Recently he has used goody powder for relief.   Please advise, thanks.

## 2021-03-19 DIAGNOSIS — Z7984 Long term (current) use of oral hypoglycemic drugs: Secondary | ICD-10-CM | POA: Diagnosis not present

## 2021-03-19 DIAGNOSIS — D3132 Benign neoplasm of left choroid: Secondary | ICD-10-CM | POA: Diagnosis not present

## 2021-03-19 DIAGNOSIS — H524 Presbyopia: Secondary | ICD-10-CM | POA: Diagnosis not present

## 2021-03-19 DIAGNOSIS — H5203 Hypermetropia, bilateral: Secondary | ICD-10-CM | POA: Diagnosis not present

## 2021-03-19 DIAGNOSIS — E119 Type 2 diabetes mellitus without complications: Secondary | ICD-10-CM | POA: Diagnosis not present

## 2021-03-19 DIAGNOSIS — H52223 Regular astigmatism, bilateral: Secondary | ICD-10-CM | POA: Diagnosis not present

## 2021-03-19 LAB — HM DIABETES EYE EXAM

## 2021-03-23 ENCOUNTER — Other Ambulatory Visit: Payer: Self-pay | Admitting: Family Medicine

## 2021-04-19 ENCOUNTER — Other Ambulatory Visit: Payer: Self-pay | Admitting: Family Medicine

## 2021-05-08 ENCOUNTER — Other Ambulatory Visit: Payer: Self-pay | Admitting: Family Medicine

## 2021-05-09 ENCOUNTER — Other Ambulatory Visit: Payer: Self-pay | Admitting: Family Medicine

## 2021-05-14 ENCOUNTER — Encounter: Payer: Self-pay | Admitting: Family Medicine

## 2021-05-14 ENCOUNTER — Ambulatory Visit (INDEPENDENT_AMBULATORY_CARE_PROVIDER_SITE_OTHER): Payer: Medicare HMO | Admitting: Family Medicine

## 2021-05-14 ENCOUNTER — Other Ambulatory Visit: Payer: Self-pay

## 2021-05-14 ENCOUNTER — Telehealth: Payer: Self-pay

## 2021-05-14 VITALS — BP 128/78 | HR 79 | Temp 97.7°F | Ht 73.5 in | Wt 194.2 lb

## 2021-05-14 DIAGNOSIS — I10 Essential (primary) hypertension: Secondary | ICD-10-CM

## 2021-05-14 DIAGNOSIS — Z23 Encounter for immunization: Secondary | ICD-10-CM

## 2021-05-14 DIAGNOSIS — E1159 Type 2 diabetes mellitus with other circulatory complications: Secondary | ICD-10-CM

## 2021-05-14 DIAGNOSIS — Z125 Encounter for screening for malignant neoplasm of prostate: Secondary | ICD-10-CM | POA: Diagnosis not present

## 2021-05-14 DIAGNOSIS — E7800 Pure hypercholesterolemia, unspecified: Secondary | ICD-10-CM

## 2021-05-14 DIAGNOSIS — Z0000 Encounter for general adult medical examination without abnormal findings: Secondary | ICD-10-CM

## 2021-05-14 LAB — CBC WITH DIFFERENTIAL/PLATELET
Basophils Absolute: 0 10*3/uL (ref 0.0–0.1)
Basophils Relative: 0.3 % (ref 0.0–3.0)
Eosinophils Absolute: 0.1 10*3/uL (ref 0.0–0.7)
Eosinophils Relative: 1.3 % (ref 0.0–5.0)
HCT: 45.9 % (ref 39.0–52.0)
Hemoglobin: 14.9 g/dL (ref 13.0–17.0)
Lymphocytes Relative: 22.1 % (ref 12.0–46.0)
Lymphs Abs: 2.6 10*3/uL (ref 0.7–4.0)
MCHC: 32.5 g/dL (ref 30.0–36.0)
MCV: 94.7 fl (ref 78.0–100.0)
Monocytes Absolute: 0.7 10*3/uL (ref 0.1–1.0)
Monocytes Relative: 6.4 % (ref 3.0–12.0)
Neutro Abs: 8.2 10*3/uL — ABNORMAL HIGH (ref 1.4–7.7)
Neutrophils Relative %: 69.9 % (ref 43.0–77.0)
Platelets: 270 10*3/uL (ref 150.0–400.0)
RBC: 4.85 Mil/uL (ref 4.22–5.81)
RDW: 13.6 % (ref 11.5–15.5)
WBC: 11.7 10*3/uL — ABNORMAL HIGH (ref 4.0–10.5)

## 2021-05-14 LAB — LIPID PANEL
Cholesterol: 123 mg/dL (ref 0–200)
HDL: 59 mg/dL (ref >39.00)
LDL Cholesterol: 51 mg/dL (ref 0–99)
NonHDL: 64.11
Total CHOL/HDL Ratio: 2
Triglycerides: 66 mg/dL (ref 0.0–149.0)
VLDL: 13.2 mg/dL (ref 0.0–40.0)

## 2021-05-14 LAB — COMPREHENSIVE METABOLIC PANEL
ALT: 10 U/L (ref 0–53)
AST: 11 U/L (ref 0–37)
Albumin: 4.5 g/dL (ref 3.5–5.2)
Alkaline Phosphatase: 73 U/L (ref 39–117)
BUN: 19 mg/dL (ref 6–23)
CO2: 28 mEq/L (ref 19–32)
Calcium: 9.9 mg/dL (ref 8.4–10.5)
Chloride: 100 mEq/L (ref 96–112)
Creatinine, Ser: 0.96 mg/dL (ref 0.40–1.50)
GFR: 82.18 mL/min (ref >60.00)
Glucose, Bld: 96 mg/dL (ref 70–99)
Potassium: 4.2 mEq/L (ref 3.5–5.1)
Sodium: 138 mEq/L (ref 135–145)
Total Bilirubin: 0.8 mg/dL (ref 0.2–1.2)
Total Protein: 6.9 g/dL (ref 6.0–8.3)

## 2021-05-14 LAB — HEMOGLOBIN A1C: Hgb A1c MFr Bld: 6.8 % — ABNORMAL HIGH (ref 4.6–6.5)

## 2021-05-14 LAB — TSH: TSH: 1.29 u[IU]/mL (ref 0.35–5.50)

## 2021-05-14 LAB — PSA, MEDICARE: PSA: 1.94 ng/ml (ref 0.10–4.00)

## 2021-05-14 MED ORDER — EMPAGLIFLOZIN 10 MG PO TABS: 10.0000 mg | ORAL_TABLET | Freq: Every day | ORAL | 3 refills | Status: DC

## 2021-05-14 MED ORDER — ATORVASTATIN CALCIUM 20 MG PO TABS: ORAL_TABLET | ORAL | 0 refills | Status: DC

## 2021-05-14 MED ORDER — LOSARTAN POTASSIUM 100 MG PO TABS: 100.0000 mg | ORAL_TABLET | Freq: Every day | ORAL | 0 refills | Status: DC

## 2021-05-14 MED ORDER — METFORMIN HCL 1000 MG PO TABS: 1000.0000 mg | ORAL_TABLET | Freq: Two times a day (BID) | ORAL | 3 refills | Status: AC

## 2021-05-14 MED ORDER — METOPROLOL TARTRATE 50 MG PO TABS: 50.0000 mg | ORAL_TABLET | Freq: Two times a day (BID) | ORAL | 0 refills | Status: DC

## 2021-05-14 MED ORDER — TRAZODONE HCL 50 MG PO TABS: ORAL_TABLET | ORAL | 3 refills | Status: DC

## 2021-05-14 MED ORDER — BD PEN NEEDLE SHORT U/F 31G X 8 MM MISC: 2 refills | Status: AC

## 2021-05-14 NOTE — Telephone Encounter (Signed)
FYI, Spoke with [facility], he has been due for repeat colonoscopy since 01/2019. Pt advised and given contact information via MyChart.

## 2021-05-14 NOTE — Progress Notes (Signed)
Office Note 05/14/2021  CC:  Chief Complaint  Patient presents with   Annual Exam   Follow-up    HPI:  Patient is a 65 y.o. male who is here for annual health maintenance exam and 3 mo f/u DM, HTN, and CAD. A/P as of last visit: "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 OK to continue prn tramadol use.  He uses this infrequently.  Cont tylenol as first line HA tx.   2) GAD: doing well on citalopram 20 qd and lorazepam 0.68m 1-2 q8h prn. Rx for #60 today with 2 RFs. CSC UTD but needs renewal next f/u 3 mo.   3) DM 2, good control on victoza, metformin, and glipizide. Hba1c and lytes/cr today.   4) HLD: tolerating atorva 251mqd. Last LDL 3 mo ago was 54, plan rpt lipids and hepatic panel 3 mo.   5) HTN: stable on losartan 100 qd and lopressor 50 bid. Lytes/cr today"  INTERIM HX: Has lost some wt, eating less than he did before. He is stressed b/c bank wants to foreclose on his home. He's down 20 lbs from March this year.  No fatigue, abd pain, fever/night sweats, melena, or abd pain.  DM: last a1c up some so I d/c'd his glipizide and started jardiance 10 qd. I kept him on metformin and victoza. Fasting gluc the other day 123.  Not checking much, though.   PMP AWARE reviewed today: most recent rx for lorazepam 0.59m46mas filled 05/08/21, # 30, rx by me. Most recent tramadol rx filled 11/08/20, #30, rx by me. No red flags.    Past Medical History:  Diagnosis Date   Anxiety    Colon cancer screening 02/2017   Pt declined colonoscopy, but agreed to cologuard screening--this test was POSITIVE on 03/11/17---f/u colonoscopy 01/28/18 w/adenomatous polyps-->recall 1 yr.   Coronary artery disease    CABG 2014.   Diabetes mellitus type 2 with complications ([place]2/22/2979DPN and microalbuminuria.   Dupuytren's disease 09/2017   ortho 09/2017, L hand   Erectile dysfunction    Dr. ManTresa Moorerol is also doing prostate ca screening).  Refractory to  medications.  Pt likely to get penile implant as of urol f/u 01/2015.   Gross hematuria 2017   Pt is to bring this up with his urologist as of 06/2016.   Hyperlipidemia    Hypertension    Increased prostate specific antigen (PSA) velocity 02/2018   PSA 1.7 to 2.7 from 02/2017 to 02/2018.  Repeat PSA 09/2018 was 3-->referral to urology done, pt did not go. Rpt PSA 11/2019 was 1.5. Holding off on urol ref..   NSTEMI (non-ST elevated myocardial infarction) ([place]2/2014   CABG x 22 Jul 2013   Obesity, Class I, BMI 30-34.9    Paroxysmal atrial fibrillation ([place]/2014   Started post-op CABG, converted on amiodarone--amiodarone stopped after a couple months   Stenosing tenosynovitis of thumb 2019   left; incarcerated in extension (ortho visit 09/2017--steroid injection). R steroid inj 06/2020    Past Surgical History:  Procedure Laterality Date   CARDIOVASCULAR STRESS TEST  04/2020   NO ISCHEMIA.  LOW RISK.  EF 51% some septal hyokinesis.   COLONOSCOPY W/ POLYPECTOMY  01/28/2018   Mild diverticulosis, small int hem.  Adenomatous polyp: recall 1 yr per GI   CORONARY ARTERY BYPASS GRAFT N/A 08/02/2013   Procedure: CORONARY ARTERY BYPASS GRAFTING (CABG);  Surgeon: EdwGrace IsaacD;  [facility];  Laterality: N/A;  Coronary artery bypass graft times five utilizing the left internal mammary artery and the right greater saphenous vein.   ENDOVEIN HARVEST OF GREATER SAPHENOUS VEIN Right 08/02/2013   Procedure: ENDOVEIN HARVEST OF GREATER SAPHENOUS VEIN;  Surgeon: Grace Isaac, MD;  Location: [place];  Service: Open Heart Surgery;  Laterality: Right;   INTRAOPERATIVE TRANSESOPHAGEAL ECHOCARDIOGRAM N/A 08/02/2013   Procedure: INTRAOPERATIVE TRANSESOPHAGEAL ECHOCARDIOGRAM;  Surgeon: Grace Isaac, MD;  Location: [place];  Service: Open Heart Surgery;  Laterality: N/A;.  EF 40%, LVH and LV dilatation, inf wall hypokinesis (at the time of his MI)   LEFT HEART  CATHETERIZATION WITH CORONARY ANGIOGRAM N/A 08/01/2013   Procedure: LEFT HEART CATHETERIZATION WITH CORONARY ANGIOGRAM;  Surgeon: Blane Ohara, MD;  [facility] CATH LAB;  Service: Cardiovascular;  Laterality: N/A;   [place]   left; pins are in place   TRANSTHORACIC ECHOCARDIOGRAM  07/2013; 04/2020   2014 EF 40%+ wall motion abnl--in the context of acute NSTEMI. 04/2020 Akinesis of the basal inferior wall with overall low normal LV systolic function; EF 50-56%, grd I DD, valves fine.    Family History  Problem Relation Age of Onset   Heart attack Mother    Heart disease Mother    Diabetes Mother    Heart disease Father    Cancer Brother        lung and liver   Colon cancer Neg Hx    Rectal cancer Neg Hx     Social History   Socioeconomic History   Marital status: Divorced    Spouse name: Not on file   Number of children: Not on file   Years of education: Not on file   Highest education level: Not on file  Occupational History   Occupation: Physicist, medical  Tobacco Use   Smoking status: Never   Smokeless tobacco: Never  Vaping Use   Vaping Use: Never used  Substance and Sexual Activity   Alcohol use: No    Alcohol/week: 0.0 standard drinks   Drug use: No   Sexual activity: Not on file  Other Topics Concern   Not on file  Social History Narrative   Married.  Separated 2020.   Has 3 biologic children.   Occupation:  Theme park manager (self employed).   Education: HS   No tobacco, alc, or drugs   Social Determinants of Health   Financial Resource Strain: Low Risk    Difficulty of Paying Living Expenses: Not hard at all  Food Insecurity: No Food Insecurity   Worried About Charity fundraiser in the Last Year: Never true   [place] in the Last Year: Never true  Transportation Needs: No Transportation Needs   Lack of Transportation (Medical): No   Lack of Transportation (Non-Medical): No  Physical Activity: Inactive    Days of Exercise per Week: 0 days   Minutes of Exercise per Session: 0 min  Stress: No Stress Concern Present   Feeling of Stress : Not at all  Social Connections: Socially Isolated   Frequency of Communication with Friends and Family: More than three times a week   Frequency of Social Gatherings with Friends and Family: Once a week   Attends Religious Services: Never   Marine scientist or Organizations: No   Attends Archivist Meetings: Never   Marital Status: Divorced  Human resources officer Violence: Not At Risk   Fear of  Current or Ex-Partner: No   Emotionally Abused: No   Physically Abused: No   Sexually Abused: No    Outpatient Medications Prior to Visit  Medication Sig Dispense Refill   albuterol (PROVENTIL HFA;VENTOLIN HFA) 108 (90 Base) MCG/ACT inhaler Inhale 2 puffs into the lungs every 4 (four) hours as needed for wheezing or shortness of breath. 1 Inhaler 1   Alcohol Swabs (B-D SINGLE USE SWABS REGULAR) PADS USE TO CHECK BLOOD SUGAR THREE TIMES DAILY 300 each 1   Blood Glucose Calibration (ACCU-CHEK AVIVA) SOLN 1 Bottle by In Vitro route as needed. 1 each 0   Blood Glucose Monitoring Suppl (TRUE METRIX METER) w/Device KIT      citalopram (CELEXA) 20 MG tablet TAKE 1 TABLET EVERY DAY 90 tablet 0   clopidogrel (PLAVIX) 75 MG tablet TAKE 1 TABLET DAILY WITH BREAKFAST 90 tablet 3   glucose blood (TRUE METRIX BLOOD GLUCOSE TEST) test strip Use as instructed 100 each 12   liraglutide (VICTOZA) 18 MG/3ML SOPN 1.2 mg SQ qd 45 mL 6   LORazepam (ATIVAN) 0.5 MG tablet 1-2 tabs po q8h prn anxiety 30 tablet 2   TRUEplus Lancets 33G MISC 1 Stick by Does not apply route 2 (two) times daily. 100 each 3   atorvastatin (LIPITOR) 20 MG tablet TAKE 1 TABLET DAILY AT 6PM. 30 tablet 0   losartan (COZAAR) 100 MG tablet TAKE 1 TABLET BY MOUTH EVERY DAY 30 tablet 0   metFORMIN (GLUCOPHAGE) 1000 MG tablet Take 1 tablet (1,000 mg total) by mouth 2 (two) times daily with a meal. 180  tablet 3   metoprolol tartrate (LOPRESSOR) 50 MG tablet TAKE 1 TABLET BY MOUTH TWICE A DAY 60 tablet 0   nitroGLYCERIN (NITROSTAT) 0.4 MG SL tablet Place 1 tablet (0.4 mg total) under the tongue every 5 (five) minutes as needed for chest pain. (Patient not taking: No sig reported) 30 tablet 0   empagliflozin (JARDIANCE) 10 MG TABS tablet Take 1 tablet (10 mg total) by mouth daily. 30 tablet 3   gabapentin (NEURONTIN) 300 MG capsule TAKE 1 CAPSULE BY MOUTH EVERYDAY AT BEDTIME (Patient not taking: Reported on 05/14/2021) 14 capsule 0   glipiZIDE (GLUCOTROL XL) 10 MG 24 hr tablet TAKE 1 TABLET EVERY DAY (Patient not taking: No sig reported) 90 tablet 0   Insulin Pen Needle (B-D ULTRAFINE III SHORT PEN) 31G X 8 MM MISC USE TO INJECT VICTOZA 100 each 2   traMADol (ULTRAM) 50 MG tablet 1-2 tabs po q6h prn headache (Patient not taking: Reported on 05/14/2021) 30 tablet 1   traZODone (DESYREL) 50 MG tablet TAKE 1 TO 2 TABLETS AT BEDTIME AS NEEDED FOR INSOMNIA 180 tablet 0   No facility-administered medications prior to visit.    Allergies  Allergen Reactions   Oxycodone Other (See Comments)    Psych/mental adverse effects   Pioglitazone Swelling and Rash    ROS Review of Systems  Constitutional:  Negative for appetite change, chills, fatigue and fever.  HENT:  Negative for congestion, dental problem, ear pain and sore throat.   Eyes:  Negative for discharge, redness and visual disturbance.  Respiratory:  Negative for cough, chest tightness, shortness of breath and wheezing.   Cardiovascular:  Negative for chest pain, palpitations and leg swelling.  Gastrointestinal:  Negative for abdominal pain, blood in stool, diarrhea, nausea and vomiting.  Genitourinary:  Negative for difficulty urinating, dysuria, flank pain, frequency, hematuria and urgency.  Musculoskeletal:  Negative for arthralgias, back pain, joint swelling,  myalgias and neck stiffness.  Skin:  Negative for pallor and rash.   Neurological:  Negative for dizziness, speech difficulty, weakness and headaches.  Hematological:  Negative for adenopathy. Does not bruise/bleed easily.  Psychiatric/Behavioral:  Negative for confusion and sleep disturbance. The patient is not nervous/anxious.    PE; Vitals with BMI 05/14/2021 02/07/2021 11/14/2020  Height 6' 1.5" _0  _1   Weight 194 lbs 3 oz 205 lbs 13 oz 210 lbs  BMI 25.27 86.75 44.92  Systolic 010 071 -  Diastolic 78 80 -  Pulse 79 66 -     Gen: Alert, well appearing.  Patient is oriented to person, place, time, and situation. AFFECT: pleasant, lucid thought and speech. ENT: Ears: EACs clear, normal epithelium.  TMs with good light reflex and landmarks bilaterally.  Eyes: no injection, icteris, swelling, or exudate.  EOMI, PERRLA. Nose: no drainage or turbinate edema/swelling.  No injection or focal lesion.  Mouth: lips without lesion/swelling.  Oral mucosa pink and moist.  Dentition intact and without obvious caries or gingival swelling.  Oropharynx without erythema, exudate, or swelling.  Neck: supple/nontender.  No LAD, mass, or TM.  Carotid pulses 2+ bilaterally, without bruits. CV: RRR, no m/r/g.   LUNGS: CTA bilat, nonlabored resps, good aeration in all lung fields. ABD: soft, NT, ND, BS normal.  No hepatospenomegaly or mass.  No bruits. EXT: no clubbing, cyanosis, or edema.  Musculoskeletal: no joint swelling, erythema, warmth, or tenderness.  ROM of all joints intact. Skin - no sores or suspicious lesions or rashes or color changes Foot exam - no swelling, tenderness or skin or vascular lesions. Color and temperature is normal. Sensation is intact. Peripheral pulses are palpable. Toenails are normal.  Pertinent labs:  Lab Results  Component Value Date   TSH 2.08 11/21/2019   Lab Results  Component Value Date   WBC 7.9 04/15/2020   HGB 13.5 04/15/2020   HCT 42.0 04/15/2020   MCV 94.2 04/15/2020   PLT 246 04/15/2020   Lab Results  Component  Value Date   CREATININE 1.14 02/07/2021   BUN 19 02/07/2021   NA 139 02/07/2021   K 4.3 02/07/2021   CL 104 02/07/2021   CO2 27 02/07/2021   Lab Results  Component Value Date   ALT 15 11/05/2020   AST 26 10/19/2019   ALKPHOS 85 10/19/2019   BILITOT 0.4 10/19/2019   Lab Results  Component Value Date   CHOL 122 11/05/2020   Lab Results  Component Value Date   HDL 53 11/05/2020   Lab Results  Component Value Date   LDLCALC 54 11/05/2020   Lab Results  Component Value Date   TRIG 74 11/05/2020   Lab Results  Component Value Date   CHOLHDL 2.3 11/05/2020   Lab Results  Component Value Date   PSA 1.50 11/21/2019   PSA 3.04 09/24/2018   PSA 2.77 03/10/2018   Lab Results  Component Value Date   HGBA1C 7.8 (H) 02/07/2021   ASSESSMENT AND PLAN:   1) DM 2: cont metformin and victoza full dose. If a1c not <7% then we'll push jardiance to 17m qd. Hba1c, lytes/cr, microalb/cr. Feet exam today: normal.  2) HQRF:XJOIcontrol on cozaar 100 qd and lopressor 50 bid. Lytes/cr check.  3) HLD in setting of CAD.  Atorva 20 qd. LDL goal <70.  LDL was 54 six mo ago. FLP and hepatic panel today.  4) Wt loss; appetite has not been normal for 6 mo or so. No  signs of illness. Obs for now.  5) Health maintenance exam: Reviewed age and gender appropriate health maintenance issues (prudent diet, regular exercise, health risks of tobacco and excessive alcohol, use of seatbelts, fire alarms in home, use of sunscreen).  Also reviewed age and gender appropriate health screening as well as vaccine recommendations. Vaccines: Prevnar 20->given today.  Flu-->given today. Labs: fasting HP, Hba1c, PSA, urine microalb/cr. Prostate ca screening: PSA today. Colon ca screening: many polyps on screening colonoscopy 2019.  We'll contact Dr. Vena Rua office to inquire about recall recommendations.  An After Visit Summary was printed and given to the patient.  FOLLOW UP:  Return in about 4  weeks (around 06/11/2021) for f/ut wt loss.  Signed:  Crissie Sickles, MD           05/14/2021

## 2021-05-14 NOTE — Addendum Note (Signed)
Addended by: Deveron Furlong D on: 05/14/2021 11:19 AM   Modules accepted: Orders

## 2021-05-14 NOTE — Telephone Encounter (Signed)
[  11:04 AM] [place], Phillip Pls call [facility] and ask when they want Early Chars to return for repeat colonoscopy (Dr. Hilarie Fredrickson did colonoscopy a few years ago and pt had many precancerous polyps---no instructions on recall can be found on paperwork or in EMR--

## 2021-05-15 NOTE — Telephone Encounter (Signed)
Great thx

## 2021-05-20 ENCOUNTER — Encounter: Payer: Self-pay | Admitting: Internal Medicine

## 2021-05-24 ENCOUNTER — Other Ambulatory Visit: Payer: Self-pay | Admitting: Family Medicine

## 2021-05-30 ENCOUNTER — Other Ambulatory Visit: Payer: Self-pay | Admitting: Family Medicine

## 2021-06-03 ENCOUNTER — Encounter: Payer: Self-pay | Admitting: *Deleted

## 2021-06-11 ENCOUNTER — Encounter: Payer: Self-pay | Admitting: Family Medicine

## 2021-06-11 ENCOUNTER — Other Ambulatory Visit: Payer: Self-pay

## 2021-06-11 ENCOUNTER — Ambulatory Visit (INDEPENDENT_AMBULATORY_CARE_PROVIDER_SITE_OTHER): Payer: Medicare HMO | Admitting: Family Medicine

## 2021-06-11 VITALS — BP 136/82 | HR 68 | Temp 97.9°F | Ht 73.5 in | Wt 196.4 lb

## 2021-06-11 DIAGNOSIS — E1159 Type 2 diabetes mellitus with other circulatory complications: Secondary | ICD-10-CM

## 2021-06-11 DIAGNOSIS — G44329 Chronic post-traumatic headache, not intractable: Secondary | ICD-10-CM | POA: Diagnosis not present

## 2021-06-11 DIAGNOSIS — F411 Generalized anxiety disorder: Secondary | ICD-10-CM

## 2021-06-11 DIAGNOSIS — L409 Psoriasis, unspecified: Secondary | ICD-10-CM | POA: Diagnosis not present

## 2021-06-11 DIAGNOSIS — Z79899 Other long term (current) drug therapy: Secondary | ICD-10-CM | POA: Diagnosis not present

## 2021-06-11 DIAGNOSIS — R634 Abnormal weight loss: Secondary | ICD-10-CM

## 2021-06-11 LAB — MICROALBUMIN / CREATININE URINE RATIO
Creatinine,U: 162.6 mg/dL
Microalb Creat Ratio: 0.9 mg/g (ref 0.0–30.0)
Microalb, Ur: 1.5 mg/dL (ref 0.0–1.9)

## 2021-06-11 MED ORDER — KETOCONAZOLE 2 % EX SHAM: MEDICATED_SHAMPOO | CUTANEOUS | 6 refills | Status: DC

## 2021-06-11 MED ORDER — BETAMETHASONE VALERATE 0.1 % EX LOTN: 1.0000 "application " | TOPICAL_LOTION | Freq: Two times a day (BID) | CUTANEOUS | 6 refills | Status: DC

## 2021-06-11 MED ORDER — LORAZEPAM 0.5 MG PO TABS: ORAL_TABLET | ORAL | 5 refills | Status: DC

## 2021-06-11 MED ORDER — TRAMADOL HCL 50 MG PO TABS: ORAL_TABLET | ORAL | 0 refills | Status: DC

## 2021-06-11 NOTE — Progress Notes (Signed)
OFFICE VISIT  06/11/2021  CC: f/u wt loss  HPI:    Patient is a 65 y.o. male who presents for 4 wk f/u wt loss. A/P as of last visit: "1) DM 2: cont metformin and victoza full dose. If a1c not <7% then we'll push jardiance to 59m qd. Hba1c, lytes/cr, microalb/cr. Feet exam today: normal.   2) HKPT:WSFKcontrol on cozaar 100 qd and lopressor 50 bid. Lytes/cr check.   3) HLD in setting of CAD.  Atorva 20 qd. LDL goal <70.  LDL was 54 six mo ago. FLP and hepatic panel today.   4) Wt loss; appetite has not been normal for 6 mo or so. No signs of illness. Obs for now.   5) Health maintenance exam: Reviewed age and gender appropriate health maintenance issues (prudent diet, regular exercise, health risks of tobacco and excessive alcohol, use of seatbelts, fire alarms in home, use of sunscreen).  Also reviewed age and gender appropriate health screening as well as vaccine recommendations. Vaccines: Prevnar 20->given today.  Flu-->given today. Labs: fasting HP, Hba1c, PSA, urine microalb/cr. Prostate ca screening: PSA today. Colon ca screening: many polyps on screening colonoscopy 2019.  We'll contact Dr. PVena Ruaoffice to inquire about recall recommendations."  INTERIM HX: Feeling well.  Appetite is improved some and he has gained a few pounds.  Still having some headaches, sounds like a couple per week, occasional need for tramadol still.    His anxiety is well controlled with as needed use of lorazepam. PMP AWARE reviewed today: most recent rx for tramadol was filled 11/08/20, # 30, rx by me. Most recent lorazepam rx filled 05/08/21, #30, rx by me. No red flags.  Past Medical History:  Diagnosis Date   Adenomatous colon polyp    Anxiety    Colon cancer screening 02/2017   Pt declined colonoscopy, but agreed to cologuard screening--this test was POSITIVE on 03/11/17---f/u colonoscopy 01/28/18 w/adenomatous polyps-->recall 1 yr.   Coronary artery disease    CABG 2014.    Diabetes mellitus type 2 with complications ([place]/2751  DPN and microalbuminuria.   Dupuytren's disease 09/2017   ortho 09/2017, L hand   Erectile dysfunction    Dr. MTresa Moore(urol is also doing prostate ca screening).  Refractory to medications.  Pt likely to get penile implant as of urol f/u 01/2015.   Gross hematuria 2017   Pt is to bring this up with his urologist as of 06/2016.   Hyperlipidemia    Hypertension    Increased prostate specific antigen (PSA) velocity 02/2018   PSA 1.7 to 2.7 from 02/2017 to 02/2018.  Repeat PSA 09/2018 was 3-->referral to urology done, pt did not go. Rpt PSA 11/2019 was 1.5. Holding off on urol ref..   NSTEMI (non-ST elevated myocardial infarction) ([place] 07/2013   CABG x 22 Jul 2013   Obesity, Class I, BMI 30-34.9    Paroxysmal atrial fibrillation ([place] 07/2013   Started post-op CABG, converted on amiodarone--amiodarone stopped after a couple months   Stenosing tenosynovitis of thumb 2019   left; incarcerated in extension (ortho visit 09/2017--steroid injection). R steroid inj 06/2020    Past Surgical History:  Procedure Laterality Date   CARDIOVASCULAR STRESS TEST  04/2020   NO ISCHEMIA.  LOW RISK.  EF 51% some septal hyokinesis.   COLONOSCOPY W/ POLYPECTOMY  01/28/2018   Mild diverticulosis, small int hem.  Adenomatous polyp: recall 1 yr per GI   CORONARY ARTERY BYPASS GRAFT N/A 08/02/2013   Procedure: CORONARY  ARTERY BYPASS GRAFTING (CABG);  Surgeon: Grace Isaac, MD;  Location: [place];  Service: Open Heart Surgery;  Laterality: N/A;  Coronary artery bypass graft times five utilizing the left internal mammary artery and the right greater saphenous vein.   ENDOVEIN HARVEST OF GREATER SAPHENOUS VEIN Right 08/02/2013   Procedure: ENDOVEIN HARVEST OF GREATER SAPHENOUS VEIN;  Surgeon: Grace Isaac, MD;  Location: [employer];  Service: Open Heart Surgery;  Laterality: Right;   INTRAOPERATIVE TRANSESOPHAGEAL ECHOCARDIOGRAM N/A 08/02/2013   Procedure:  INTRAOPERATIVE TRANSESOPHAGEAL ECHOCARDIOGRAM;  Surgeon: Grace Isaac, MD;  Location: [place];  Service: Open Heart Surgery;  Laterality: N/A;.  EF 40%, LVH and LV dilatation, inf wall hypokinesis (at the time of his MI)   LEFT HEART CATHETERIZATION WITH CORONARY ANGIOGRAM N/A 08/01/2013   Procedure: LEFT HEART CATHETERIZATION WITH CORONARY ANGIOGRAM;  Surgeon: Blane Ohara, MD;  [facility] C[facility] LAB;  Service: Cardiovascular;  Laterality: N/A;   [place]   left; pins are in place   TRANSTHORACIC ECHOCARDIOGRAM  07/2013; 04/2020   2014 EF 40%+ wall motion abnl--in the context of acute NSTEMI. 04/2020 Akinesis of the basal inferior wall with overall low normal LV systolic function; EF 42-35%, grd I DD, valves fine.    Outpatient Medications Prior to Visit  Medication Sig Dispense Refill   albuterol (PROVENTIL HFA;VENTOLIN HFA) 108 (90 Base) MCG/ACT inhaler Inhale 2 puffs into the lungs every 4 (four) hours as needed for wheezing or shortness of breath. 1 Inhaler 1   Alcohol Swabs (B-D SINGLE USE SWABS REGULAR) PADS USE TO CHECK BLOOD SUGAR THREE TIMES DAILY 300 each 1   atorvastatin (LIPITOR) 20 MG tablet TAKE 1 TABLET DAILY AT 6PM. 30 tablet 0   Blood Glucose Calibration (ACCU-CHEK AVIVA) SOLN 1 Bottle by In Vitro route as needed. 1 each 0   Blood Glucose Monitoring Suppl (TRUE METRIX METER) w/Device KIT      citalopram (CELEXA) 20 MG tablet TAKE 1 TABLET EVERY DAY 90 tablet 0   clopidogrel (PLAVIX) 75 MG tablet TAKE 1 TABLET DAILY WITH BREAKFAST 90 tablet 3   empagliflozin (JARDIANCE) 10 MG TABS tablet Take 1 tablet (10 mg total) by mouth daily. 30 tablet 3   GABAPENTIN PO Take by mouth at bedtime.     glucose blood (TRUE METRIX BLOOD GLUCOSE TEST) test strip Use as instructed 100 each 12   Insulin Pen Needle (B-D ULTRAFINE III SHORT PEN) 31G X 8 MM MISC USE TO INJECT VICTOZA 100 each 2   liraglutide (VICTOZA) 18 MG/3ML SOPN 1.2 mg SQ qd 45  mL 6   losartan (COZAAR) 100 MG tablet Take 1 tablet (100 mg total) by mouth daily. 30 tablet 0   metFORMIN (GLUCOPHAGE) 1000 MG tablet Take 1 tablet (1,000 mg total) by mouth 2 (two) times daily with a meal. 180 tablet 3   metoprolol tartrate (LOPRESSOR) 50 MG tablet Take 1 tablet (50 mg total) by mouth 2 (two) times daily. 60 tablet 0   traZODone (DESYREL) 50 MG tablet TAKE 1 TO 2 TABLETS AT BEDTIME AS NEEDED FOR INSOMNIA 180 tablet 3   TRUEplus Lancets 33G MISC 1 Stick by Does not apply route 2 (two) times daily. 100 each 3   LORazepam (ATIVAN) 0.5 MG tablet 1-2 tabs po q8h prn anxiety 30 tablet 2   nitroGLYCERIN (NITROSTAT) 0.4 MG SL tablet Place 1 tablet (0.4 mg total) under the tongue every 5 (five) minutes as  needed for chest pain. (Patient not taking: No sig reported) 30 tablet 0   No facility-administered medications prior to visit.    Allergies  Allergen Reactions   Oxycodone Other (See Comments)    Psych/mental adverse effects   Pioglitazone Swelling and Rash    ROS As per HPI  PE: Vitals with BMI 06/11/2021 05/14/2021 02/07/2021  Height 6' 1.5" 6' 1.5" 6' 2"   Weight 196 lbs 6 oz 194 lbs 3 oz 205 lbs 13 oz  BMI 25.56 96.22 29.79  Systolic 892 119 417  Diastolic 82 78 80  Pulse 68 79 66     Gen: Alert, well appearing.  Patient is oriented to person, place, time, and situation. AFFECT: pleasant, lucid thought and speech. Scalp: some flakiness of generalized scalp region, a few small pinkish hyperkeratotic and mildly excoriated patches on top of scalp, several splotches of deep pink hyperkeratotic skin--almost plaques--mostly around hairline but extending into hair some as well.  LABS:  Lab Results  Component Value Date   TSH 1.29 05/14/2021   Lab Results  Component Value Date   WBC 11.7 (H) 05/14/2021   HGB 14.9 05/14/2021   HCT 45.9 05/14/2021   MCV 94.7 05/14/2021   PLT 270.0 05/14/2021   Lab Results  Component Value Date   CREATININE 0.96 05/14/2021    BUN 19 05/14/2021   NA 138 05/14/2021   K 4.2 05/14/2021   CL 100 05/14/2021   CO2 28 05/14/2021   Lab Results  Component Value Date   ALT 10 05/14/2021   AST 11 05/14/2021   ALKPHOS 73 05/14/2021   BILITOT 0.8 05/14/2021   Lab Results  Component Value Date   CHOL 123 05/14/2021   Lab Results  Component Value Date   HDL 59.00 05/14/2021   Lab Results  Component Value Date   LDLCALC 51 05/14/2021   Lab Results  Component Value Date   TRIG 66.0 05/14/2021   Lab Results  Component Value Date   CHOLHDL 2 05/14/2021   Lab Results  Component Value Date   PSA 1.94 05/14/2021   PSA 1.50 11/21/2019   PSA 3.04 09/24/2018   Lab Results  Component Value Date   HGBA1C 6.8 (H) 05/14/2021   IMPRESSION AND PLAN:  1) Wt loss: this has stabilized.  He feels well, appetite and PO intake fine. Labs have been normal.    2) Chronic anxiety; he does well with loraz 0.109m tid prn, usually uses once a day only. #30 with 5 RF rx'd today. CSC UTD.  UDS today.  3) Post-traumatic headaches: still occurring but gradually dissipating. Ok to continue tramadol 513mif tylenol or motrin no help. CSC UTD, UDS today. Tramadol 5045m#30, no RF.  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 Ketoconazole 2% shampoo qod and betamethasone 1% lotion bid prn.  An After Visit Summary was printed and given to the patient.  FOLLOW UP: Return in about 2 months (around 08/11/2021) for routine chronic illness f/u. Next cpe sept/oc 2023  Signed:  PhiCrissie SicklesD           06/11/2021

## 2021-06-15 ENCOUNTER — Other Ambulatory Visit: Payer: Self-pay | Admitting: Family Medicine

## 2021-06-17 NOTE — Telephone Encounter (Signed)
Pls see if pharmacy has recommended replacement that they have in stock.

## 2021-06-17 NOTE — Telephone Encounter (Signed)
Pharmacy is unable to get this in stock. Alternative requested  Please review and advise. Med pending

## 2021-06-18 MED ORDER — CLOBETASOL PROPIONATE 0.05 % EX SOLN: CUTANEOUS | 3 refills | Status: DC

## 2021-06-18 NOTE — Telephone Encounter (Signed)
Clobetasol lotion or solution

## 2021-06-18 NOTE — Telephone Encounter (Signed)
OK. Clobetasol solution eRx'd

## 2021-06-20 LAB — DRUG MONITORING PANEL 376104, URINE
Alphahydroxyalprazolam: NEGATIVE ng/mL (ref <25)
Alphahydroxymidazolam: NEGATIVE ng/mL (ref <50)
Alphahydroxytriazolam: NEGATIVE ng/mL (ref <50)
Aminoclonazepam: NEGATIVE ng/mL (ref <25)
Amphetamines: NEGATIVE ng/mL (ref <500)
Barbiturates: NEGATIVE ng/mL (ref <300)
Benzodiazepines: POSITIVE ng/mL — AB (ref <100)
Benzoylecgonine: 332 ng/mL — ABNORMAL HIGH (ref <100)
Cocaine Metabolite: POSITIVE ng/mL — AB (ref <150)
Desmethyltramadol: NEGATIVE ng/mL (ref <100)
Hydroxyethylflurazepam: NEGATIVE ng/mL (ref <50)
Lorazepam: 212 ng/mL — ABNORMAL HIGH (ref <50)
Nordiazepam: NEGATIVE ng/mL (ref <50)
Opiates: NEGATIVE ng/mL (ref <100)
Oxazepam: NEGATIVE ng/mL (ref <50)
Oxycodone: NEGATIVE ng/mL (ref <100)
Temazepam: NEGATIVE ng/mL (ref <50)
Tramadol: NEGATIVE ng/mL (ref <100)

## 2021-06-20 LAB — DM TEMPLATE

## 2021-07-02 ENCOUNTER — Other Ambulatory Visit: Payer: Self-pay | Admitting: Family Medicine

## 2021-07-03 ENCOUNTER — Other Ambulatory Visit: Payer: Self-pay

## 2021-07-03 NOTE — Telephone Encounter (Signed)
RF request for atorvastatin LOV:06/11/21 Next BP:[id number] to f/u 2 months Last written: 07/02/21(30,0)   Requesting: tramadol Contract:02/07/21 UDS:06/11/21 Last Visit:06/11/21 Next Visit:advised to f/u 2 months Last Refill:06/11/21(30,0)  Please Advise. Medications pending

## 2021-07-07 ENCOUNTER — Encounter: Payer: Self-pay | Admitting: Family Medicine

## 2021-07-08 MED ORDER — ATORVASTATIN CALCIUM 20 MG PO TABS: ORAL_TABLET | ORAL | 3 refills | Status: DC

## 2021-07-08 NOTE — Telephone Encounter (Signed)
Result note forwarded back to Dr.McGowen to follow up with patient

## 2021-07-08 NOTE — Telephone Encounter (Signed)
Information provided to PCP.

## 2021-07-09 NOTE — Telephone Encounter (Signed)
Alex Sou, MD  07/08/2021  6:09 PM EST     Discussed results with pt. I told him I can no longer rx him controlled substances.  He expressed understanding. He has been on lorazepam long term so I recommended he wean himself off.  He says he has approx 10 tabs left to use for this wean. No new rx's today.

## 2021-07-17 ENCOUNTER — Other Ambulatory Visit: Payer: Self-pay

## 2021-07-17 MED ORDER — METOPROLOL TARTRATE 50 MG PO TABS: 50.0000 mg | ORAL_TABLET | Freq: Two times a day (BID) | ORAL | 0 refills | Status: DC

## 2021-07-24 ENCOUNTER — Ambulatory Visit: Payer: Medicare HMO | Admitting: Internal Medicine

## 2021-07-30 ENCOUNTER — Other Ambulatory Visit: Payer: Self-pay | Admitting: Family Medicine

## 2021-08-05 ENCOUNTER — Telehealth: Payer: Self-pay | Admitting: Internal Medicine

## 2021-08-05 NOTE — Telephone Encounter (Signed)
Good Afternoon Dr. Hilarie Fredrickson,   Patient called to cancel appointment for tomorrow at 10:30 due to being out of town for the holidays.  Patient will call when he returns to reschedule.

## 2021-08-06 ENCOUNTER — Ambulatory Visit: Payer: Medicare HMO | Admitting: Internal Medicine

## 2021-08-12 ENCOUNTER — Other Ambulatory Visit: Payer: Self-pay | Admitting: Family Medicine

## 2021-08-13 ENCOUNTER — Other Ambulatory Visit: Payer: Self-pay | Admitting: Family Medicine

## 2021-08-13 ENCOUNTER — Ambulatory Visit: Payer: Medicare HMO | Admitting: Family Medicine

## 2021-09-03 ENCOUNTER — Other Ambulatory Visit: Payer: Self-pay | Admitting: Family Medicine

## 2021-09-14 ENCOUNTER — Other Ambulatory Visit: Payer: Self-pay | Admitting: Family Medicine

## 2021-09-28 ENCOUNTER — Other Ambulatory Visit: Payer: Self-pay | Admitting: Family Medicine

## 2021-09-30 ENCOUNTER — Other Ambulatory Visit: Payer: Self-pay | Admitting: Family Medicine

## 2021-10-02 ENCOUNTER — Other Ambulatory Visit: Payer: Self-pay | Admitting: Family Medicine

## 2021-10-03 DIAGNOSIS — E559 Vitamin D deficiency, unspecified: Secondary | ICD-10-CM | POA: Diagnosis not present

## 2021-10-03 DIAGNOSIS — I2510 Atherosclerotic heart disease of native coronary artery without angina pectoris: Secondary | ICD-10-CM | POA: Diagnosis not present

## 2021-10-03 DIAGNOSIS — E785 Hyperlipidemia, unspecified: Secondary | ICD-10-CM | POA: Diagnosis not present

## 2021-10-03 DIAGNOSIS — Z7689 Persons encountering health services in other specified circumstances: Secondary | ICD-10-CM | POA: Diagnosis not present

## 2021-10-03 DIAGNOSIS — E118 Type 2 diabetes mellitus with unspecified complications: Secondary | ICD-10-CM | POA: Diagnosis not present

## 2021-10-03 DIAGNOSIS — I10 Essential (primary) hypertension: Secondary | ICD-10-CM | POA: Diagnosis not present

## 2021-10-03 DIAGNOSIS — I480 Paroxysmal atrial fibrillation: Secondary | ICD-10-CM | POA: Diagnosis not present

## 2021-10-03 DIAGNOSIS — Z951 Presence of aortocoronary bypass graft: Secondary | ICD-10-CM | POA: Diagnosis not present

## 2021-10-28 ENCOUNTER — Telehealth: Payer: Self-pay | Admitting: *Deleted

## 2021-10-28 NOTE — Telephone Encounter (Signed)
I tried to reach the pt but vm is full, could not leave message to call back. Pt will need to be scheduled for IN Person appt for pre op clearance. This can be scheduled by the scheduling team if the pt calls back.  ?

## 2021-10-28 NOTE — Telephone Encounter (Signed)
? ?  Pre-operative Risk Assessment  ?  ?Patient Name: Alex Yang  ?DOB: [birth year] ?MRN: [id number]  ? ? ?PER MISSY WITH DENTAL OFFICE SHE WILL FAX OVER NEW CLEARANCE WHEN THEY ARE READY FOR DENTAL CLEANING IN A FEW MONTHS ?Request for Surgical Clearance   ? ?Procedure:  Dental Extraction - Amount of Teeth to be Pulled:  17 TEETH TO BE EXTRACTED ? ?Date of Surgery:  Clearance TBD                              ?   ?Surgeon:  DR. Veryl Speak, DDS ?Surgeon's Group or Practice Name:  [facility]  ?Phone number:  [phone] ?Fax number:  [phone] ?  ?Type of Clearance Requested:   ?- Medical  ?- Pharmacy:  Hold Clopidogrel (Plavix)   ?DOES PT NEED SBE?  ? ?Type of Anesthesia:  Local  ?  ?Additional requests/questions:   ? ?Signed, ?Julaine Hua   ?10/28/2021, 12:35 PM  ? ?

## 2021-10-28 NOTE — Telephone Encounter (Signed)
? ?  Name: Alex Yang  ?DOB: 1956-07-20  ?MRN: [id number] ? ?Primary Cardiologist: Mertie Moores, MD ? ?Chart reviewed as part of pre-operative protocol coverage. Because of Alex Yang past medical history and time since last visit, he will require a follow-up visit in order to better assess preoperative cardiovascular risk. ? ?Pre-op covering staff: ?- Please schedule appointment and call patient to inform them. If patient already had an upcoming appointment within acceptable timeframe, please add "pre-op clearance" to the appointment notes so provider is aware. ?- Please contact requesting surgeon's office via preferred method (i.e, phone, fax) to inform them of need for appointment prior to surgery. ? ?If applicable, this message will also be routed to pharmacy pool and/or primary cardiologist for input on holding anticoagulant/antiplatelet agent as requested below so that this information is available to the clearing provider at time of patient's appointment.  ? ?Almyra Deforest, [place]  ?10/28/2021, 1:31 PM  ? ?

## 2021-10-29 NOTE — Telephone Encounter (Signed)
Tried to reach pt again today, though vm is full and cannot leave message. will send FYI to requesting in hopes they s/w the pt to please let him know he needs to call his cardiology office [phone] for IN Person appt for pre op clearance.   ?

## 2021-10-30 NOTE — Telephone Encounter (Signed)
3rd attempt to reach pt, voicemail full cannot leave a message. 3/15 jw  ?

## 2021-10-31 ENCOUNTER — Other Ambulatory Visit: Payer: Self-pay | Admitting: Family Medicine

## 2021-10-31 ENCOUNTER — Encounter: Payer: Self-pay | Admitting: *Deleted

## 2021-10-31 NOTE — Telephone Encounter (Signed)
Our office has attempted x 3 to reach the pt w/o success. I will send the pt a letter today to call the office for an appt for pre op clearance. I will also update the requesting office we have not been able to reach the pt for appt. Pt is not cleared for his procedure until he has been seen by the cardiologist. I will remove from the call back pool at this time. Will re-address once the pt does call back.  ?

## 2021-11-05 ENCOUNTER — Telehealth: Payer: Self-pay | Admitting: *Deleted

## 2021-11-05 NOTE — Telephone Encounter (Signed)
Missy with [employer] called today. Missy stated she got the notes that we sent that we have not been able to reach the pt for his pre op appt with cardiology, see previous notes. Missy, asked if the pt truly needed an appt with the cardiologist. I answered yes, as we have not seen the pt since 10/2020 and he is having 17 teeth extracted and he is on Plavix. Missy, said pt's appt is next week. I stated that we were given it was TBD, Missy stated they just planned date the other day. Missy stated she will try to reach the pt and let him know that he needs to call his cardiologist for an appt for pre op clearance.  ?

## 2021-11-07 NOTE — Telephone Encounter (Signed)
Pt has appt 11/11/21 with Almyra Deforest, PAC. Will forward notes to [facility] for upcoming appt. Will send FYI to requesting office pt has appt 11/11/21.  ?

## 2021-11-07 NOTE — Telephone Encounter (Signed)
Patient returned called.  He is schedule for 3/27 at the [facility] office.  ?

## 2021-11-11 ENCOUNTER — Ambulatory Visit: Payer: Medicare HMO | Admitting: Physician Assistant

## 2021-11-15 ENCOUNTER — Other Ambulatory Visit: Payer: Self-pay | Admitting: Family Medicine

## 2021-11-15 ENCOUNTER — Telehealth: Payer: Self-pay | Admitting: Cardiovascular Disease
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
## 2021-11-15 NOTE — Telephone Encounter (Signed)
Call placed to patient. Mailbox is full. Unable to leave a message ?

## 2021-11-15 NOTE — Telephone Encounter (Signed)
Pt had teeth pulled yesterday and wants to know what he can take for pain ?

## 2021-11-18 ENCOUNTER — Telehealth: Payer: Self-pay | Admitting: Cardiovascular Disease

## 2021-11-18 NOTE — Telephone Encounter (Signed)
Called patient back about message. Informed patient that a lot of times Tylenol works with most medications. Informed patient that if he takes any ibuprofen to take it with food and informed him of bleed precautions since he is on Plavix. Encouraged patient to call dentist for further advisement on pain. Will forward to Dr. Acie Fredrickson for advisement. ?

## 2021-11-18 NOTE — Telephone Encounter (Signed)
Patient is calling stating he recently got dentures put in on 03/28 and is having a migraine from it. He states it is also hurting when he puts them in and takes them out. He is wanting to know what pain medication he can take for this due to not being prescribed any. Please advise.   ?

## 2021-11-19 ENCOUNTER — Ambulatory Visit: Payer: Medicare HMO | Admitting: General Practice

## 2021-11-20 NOTE — Telephone Encounter (Signed)
Spoke with pt in regards to pain medicine post 17 teeth removed.  Pt reports was told to take ibuprofen and tylenol.  Advised pt that ibuprofen should not be taken with clopidogrel d/t increased risk of bleeding.  Expresses that tylenol does not control mouth pain. Advised pt to contact dentist office for alternative medication for pain.  Pt thanked me for call no further questions or concerns.  ?

## 2021-11-20 NOTE — Telephone Encounter (Signed)
Called pt unable to leave a message d/t mailbox full.  ?

## 2021-11-22 ENCOUNTER — Other Ambulatory Visit: Payer: Self-pay | Admitting: Family Medicine

## 2021-11-26 ENCOUNTER — Telehealth: Payer: Self-pay | Admitting: Family Medicine

## 2021-11-26 NOTE — Telephone Encounter (Signed)
FYI: ? ?Pt called back informed him about AWV (telephone) rescheduled to 12/11/21 @ 1:00pm ? ?Pt confirmed ?

## 2021-11-26 NOTE — Telephone Encounter (Signed)
AWV change to 12/11/21 @ 1pm.  Called 4/11/2 to r/s mailbox full [facility].  Please confirm appt change [facility] ?

## 2021-12-04 ENCOUNTER — Ambulatory Visit: Payer: Medicare Other

## 2021-12-11 ENCOUNTER — Ambulatory Visit (INDEPENDENT_AMBULATORY_CARE_PROVIDER_SITE_OTHER): Payer: Medicare Other

## 2021-12-11 DIAGNOSIS — Z0000 Encounter for general adult medical examination without abnormal findings: Secondary | ICD-10-CM | POA: Diagnosis not present

## 2021-12-11 DIAGNOSIS — Z1211 Encounter for screening for malignant neoplasm of colon: Secondary | ICD-10-CM | POA: Diagnosis not present

## 2021-12-11 DIAGNOSIS — Z86010 Personal history of colonic polyps: Secondary | ICD-10-CM

## 2021-12-11 NOTE — Progress Notes (Addendum)
Virtual Visit via Telephone Note ? ?I connected with  Alex Yang on 12/11/21 at  1:00 PM EDT by telephone and verified that I am speaking with the correct person using two identifiers. ? ?Medicare Annual Wellness visit completed telephonically due to Covid-19 pandemic.  ? ?Persons participating in this call: This Health Coach and this patient.  ? ?Location: ?Patient: Home ?Provider: Office ?  ?I discussed the limitations, risks, security and privacy concerns of performing an evaluation and management service by telephone and the availability of in person appointments. The patient expressed understanding and agreed to proceed. ? ?Unable to perform video visit due to video visit attempted and failed and/or patient does not have video capability.  ? ?Some vital signs may be absent or patient reported.  ? ?Willette Brace, LPN ? ? ?Subjective:  ? Alex Yang is a 66 y.o. male who presents for Medicare Annual/Subsequent preventive examination. ? ?Review of Systems    ? ?Cardiac Risk Factors include: advanced age (>85mn, >>46women);diabetes mellitus;hypertension;male gender;dyslipidemia ? ?   ?Objective:  ?  ?There were no vitals filed for this visit. ?There is no height or weight on file to calculate BMI. ? ? ?  12/11/2021  ? 12:57 PM 11/14/2020  ?  8:25 AM 09/11/2020  ?  5:06 PM 04/15/2020  ?  4:43 PM 01/28/2018  ?  1:59 PM 02/27/2017  ? 10:51 AM 07/30/2015  ?  3:01 PM  ?Advanced Directives  ?Does Patient Have a Medical Advance Directive? Yes _0  No  ?Type of Advance Directive Living will        ?Would patient like information on creating a medical advance directive?  Yes (MAU/Ambulatory/Procedural Areas - Information given)    Yes (MAU/Ambulatory/Procedural Areas - Information given) No - patient declined information  ? ? ?Current Medications (verified) ?Outpatient Encounter Medications as of 12/11/2021  ?Medication Sig  ? ACCU-CHEK AVIVA PLUS test strip USE  TO CHECK BLOOD SUGAR THREE TIMES DAILY  ?  albuterol (PROVENTIL HFA;VENTOLIN HFA) 108 (90 Base) MCG/ACT inhaler Inhale 2 puffs into the lungs every 4 (four) hours as needed for wheezing or shortness of breath.  ? Alcohol Swabs (B-D SINGLE USE SWABS REGULAR) PADS USE TO CHECK BLOOD SUGAR THREE TIMES DAILY  ? atorvastatin (LIPITOR) 20 MG tablet 1 tab po qd  ? Blood Glucose Calibration (ACCU-CHEK AVIVA) SOLN 1 Bottle by In Vitro route as needed.  ? Blood Glucose Monitoring Suppl (TRUE METRIX METER) w/Device KIT   ? citalopram (CELEXA) 20 MG tablet TAKE 1 TABLET EVERY DAY  ? clobetasol (TEMOVATE) 0.05 % external solution Apply to scalp once daily  ? clopidogrel (PLAVIX) 75 MG tablet TAKE 1 TABLET DAILY WITH BREAKFAST  ? Insulin Pen Needle (B-D ULTRAFINE III SHORT PEN) 31G X 8 MM MISC USE TO INJECT VICTOZA  ? JARDIANCE 10 MG TABS tablet TAKE 1 TABLET EVERY DAY  ? liraglutide (VICTOZA) 18 MG/3ML SOPN INJECT SUBCUTANEOUSLY  1.2MG EVERY DAY  ? losartan (COZAAR) 100 MG tablet TAKE 1 TABLET BY MOUTH EVERY DAY  ? metFORMIN (GLUCOPHAGE) 1000 MG tablet Take 1 tablet (1,000 mg total) by mouth 2 (two) times daily with a meal.  ? metoprolol tartrate (LOPRESSOR) 50 MG tablet Take 1 tablet (50 mg total) by mouth 2 (two) times daily. OFFICE VISIT NEEDED FOR FURTHER REFILLS  ? traZODone (DESYREL) 50 MG tablet TAKE 1 TO 2 TABLETS AT BEDTIME AS NEEDED FOR INSOMNIA  ? TRUEplus Lancets 33G MISC 1 Stick by  Does not apply route 2 (two) times daily.  ? nitroGLYCERIN (NITROSTAT) 0.4 MG SL tablet Place 1 tablet (0.4 mg total) under the tongue every 5 (five) minutes as needed for chest pain. (Patient not taking: Reported on 11/08/2020)  ? [DISCONTINUED] GABAPENTIN PO Take by mouth at bedtime.  ? [DISCONTINUED] ketoconazole (NIZORAL) 2 % shampoo Apply every other day  ? [DISCONTINUED] LORazepam (ATIVAN) 0.5 MG tablet 1-2 tabs po q8h prn anxiety  ? [DISCONTINUED] traMADol (ULTRAM) 50 MG tablet 1-2 tabs po q6h prn headache  ? ?No facility-administered encounter medications on file as of  12/11/2021.  ? ? ?Allergies (verified) ?Lorazepam, Oxycodone, and Pioglitazone  ? ?History: ?Past Medical History:  ?Diagnosis Date  ? Adenomatous colon polyp   ? Anxiety   ? Colon cancer screening 02/2017  ? Pt declined colonoscopy, but agreed to cologuard screening--this test was POSITIVE on 03/11/17---f/u colonoscopy 01/28/18 w/adenomatous polyps-->recall 1 yr.  ? Coronary artery disease   ? CABG 2014.  ? Diabetes mellitus type 2 with complications ([place]) 24/2353  ? DPN and microalbuminuria.  ? Dupuytren's disease 09/2017  ? ortho 09/2017, L hand  ? Erectile dysfunction   ? Dr. Tresa Moore (urol is also doing prostate ca screening).  Refractory to medications.  Pt likely to get penile implant as of urol f/u 01/2015.  ? Gross hematuria 2017  ? Pt is to bring this up with his urologist as of 06/2016.  ? Hyperlipidemia   ? Hypertension   ? Increased prostate specific antigen (PSA) velocity 02/2018  ? PSA 1.7 to 2.7 from 02/2017 to 02/2018.  Repeat PSA 09/2018 was 3-->referral to urology done, pt did not go. Rpt PSA 11/2019 was 1.5. Holding off on urol ref..  ? NSTEMI (non-ST elevated myocardial infarction) ([place]) 07/2013  ? CABG x 22 Jul 2013  ? Obesity, Class I, BMI 30-34.9   ? Paroxysmal atrial fibrillation ([place]) 07/2013  ? Started post-op CABG, converted on amiodarone--amiodarone stopped after a couple months  ? Stenosing tenosynovitis of thumb 2019  ? left; incarcerated in extension (ortho visit 09/2017--steroid injection). R steroid inj 06/2020  ? ?Past Surgical History:  ?Procedure Laterality Date  ? CARDIOVASCULAR STRESS TEST  04/2020  ? NO ISCHEMIA.  LOW RISK.  EF 51% some septal hyokinesis.  ? COLONOSCOPY W/ POLYPECTOMY  01/28/2018  ? Mild diverticulosis, small int hem.  Adenomatous polyp: recall 1 yr per GI  ? CORONARY ARTERY BYPASS GRAFT N/A 08/02/2013  ? Procedure: CORONARY ARTERY BYPASS GRAFTING (CABG);  Surgeon: Grace Isaac, MD;  Location: [place];  Service: Open Heart Surgery;  Laterality: N/A;  Coronary artery  bypass graft times five utilizing the left internal mammary artery and the right greater saphenous vein.  ? ENDOVEIN HARVEST OF GREATER SAPHENOUS VEIN Right 08/02/2013  ? Procedure: ENDOVEIN HARVEST OF GREATER SAPHENOUS VEIN;  Surgeon: Grace Isaac, MD;  Location: [place];  Service: Open Heart Surgery;  Laterality: Right;  ? INTRAOPERATIVE TRANSESOPHAGEAL ECHOCARDIOGRAM N/A 08/02/2013  ? Procedure: INTRAOPERATIVE TRANSESOPHAGEAL ECHOCARDIOGRAM;  Surgeon: Grace Isaac, MD;  Location: [place];  Service: Open Heart Surgery;  Laterality: N/A;.  EF 40%, LVH and LV dilatation, inf wall hypokinesis (at the time of his MI)  ? LEFT HEART CATHETERIZATION WITH CORONARY ANGIOGRAM N/A 08/01/2013  ? Procedure: LEFT HEART CATHETERIZATION WITH CORONARY ANGIOGRAM;  Surgeon: Blane Ohara, MD;  Location: [facility] CATH LAB;  Service: Cardiovascular;  Laterality: N/A;  ? NASAL FRACTURE SURGERY  1979  ? SHOULDER SURGERY  1983  ? left;  pins are in place  ? TRANSTHORACIC ECHOCARDIOGRAM  07/2013; 04/2020  ? 2014 EF 40%+ wall motion abnl--in the context of acute NSTEMI. 04/2020 Akinesis of the basal inferior wall with overall low normal LV systolic function; EF 78-24%, grd I DD, valves fine.  ? ?Family History  ?Problem Relation Age of Onset  ? Heart attack Mother   ? Heart disease Mother   ? Diabetes Mother   ? Heart disease Father   ? Cancer Brother   ?     lung and liver  ? Colon cancer Neg Hx   ? Rectal cancer Neg Hx   ? ?Social History  ? ?Socioeconomic History  ? Marital status: Divorced  ?  Spouse name: Not on file  ? Number of children: Not on file  ? Years of education: Not on file  ? Highest education level: Not on file  ?Occupational History  ? Occupation: Physicist, medical  ?Tobacco Use  ? Smoking status: Never  ? Smokeless tobacco: Never  ?Vaping Use  ? Vaping Use: Never used  ?Substance and Sexual Activity  ? Alcohol use: No  ?  Alcohol/week: 0.0 standard drinks  ? Drug use: No  ? Sexual activity: Not on file  ?Other Topics  Concern  ? Not on file  ?Social History Narrative  ? Divorced (2020).  ? Has 3 biologic children.  ? Occupation:  Theme park manager (self employed).  ? Education: HS  ? No tobacco, alc, or drugs  ? ?Social Determinants of H

## 2021-12-11 NOTE — Patient Instructions (Signed)
Alex Yang , ?Thank you for taking time to come for your Medicare Wellness Visit. I appreciate your ongoing commitment to your health goals. Please review the following plan we discussed and let me know if I can assist you in the future.  ? ?Screening recommendations/referrals: ?Colonoscopy: order placed 12/11/21 ?Recommended yearly ophthalmology/optometry visit for glaucoma screening and checkup ?Recommended yearly dental visit for hygiene and checkup ? ?Vaccinations: ?Influenza vaccine: Done 05/14/21 repeat every year  ?Pneumococcal vaccine: Up to date ?Tdap vaccine: 03/08/14 repeat every 10 years  ?Shingles vaccine: Completed 8/2, 07/14/17   ?Covid-19: Completed 3/11, 11/13/19 ? ?Advanced directives: Please bring a copy of your health care power of attorney and living will to the office at your convenience. ? ?Conditions/risks identified: Start going to the gym 3 days a week in June  ? ?Next appointment: Follow up in one year for your annual wellness visit.  ? ?Preventive Care 58 Years and Older, Male ?Preventive care refers to lifestyle choices and visits with your health care provider that can promote health and wellness. ?What does preventive care include? ?A yearly physical exam. This is also called an annual well check. ?Dental exams once or twice a year. ?Routine eye exams. Ask your health care provider how often you should have your eyes checked. ?Personal lifestyle choices, including: ?Daily care of your teeth and gums. ?Regular physical activity. ?Eating a healthy diet. ?Avoiding tobacco and drug use. ?Limiting alcohol use. ?Practicing safe sex. ?Taking low doses of aspirin every day. ?Taking vitamin and mineral supplements as recommended by your health care provider. ?What happens during an annual well check? ?The services and screenings done by your health care provider during your annual well check will depend on your age, overall health, lifestyle risk factors, and family history of disease. ?Counseling   ?Your health care provider may ask you questions about your: ?Alcohol use. ?Tobacco use. ?Drug use. ?Emotional well-being. ?Home and relationship well-being. ?Sexual activity. ?Eating habits. ?History of falls. ?Memory and ability to understand (cognition). ?Work and work Statistician. ?Screening  ?You may have the following tests or measurements: ?Height, weight, and BMI. ?Blood pressure. ?Lipid and cholesterol levels. These may be checked every 5 years, or more frequently if you are over 54 years old. ?Skin check. ?Lung cancer screening. You may have this screening every year starting at age 41 if you have a 30-pack-year history of smoking and currently smoke or have quit within the past 15 years. ?Fecal occult blood test (FOBT) of the stool. You may have this test every year starting at age 67. ?Flexible sigmoidoscopy or colonoscopy. You may have a sigmoidoscopy every 5 years or a colonoscopy every 10 years starting at age 5. ?Prostate cancer screening. Recommendations will vary depending on your family history and other risks. ?Hepatitis C blood test. ?Hepatitis B blood test. ?Sexually transmitted disease (STD) testing. ?Diabetes screening. This is done by checking your blood sugar (glucose) after you have not eaten for a while (fasting). You may have this done every 1-3 years. ?Abdominal aortic aneurysm (AAA) screening. You may need this if you are a current or former smoker. ?Osteoporosis. You may be screened starting at age 50 if you are at high risk. ?Talk with your health care provider about your test results, treatment options, and if necessary, the need for more tests. ?Vaccines  ?Your health care provider may recommend certain vaccines, such as: ?Influenza vaccine. This is recommended every year. ?Tetanus, diphtheria, and acellular pertussis (Tdap, Td) vaccine. You may need a Td  booster every 10 years. ?Zoster vaccine. You may need this after age 76. ?Pneumococcal 13-valent conjugate (PCV13) vaccine.  One dose is recommended after age 44. ?Pneumococcal polysaccharide (PPSV23) vaccine. One dose is recommended after age 64. ?Talk to your health care provider about which screenings and vaccines you need and how often you need them. ?This information is not intended to replace advice given to you by your health care provider. Make sure you discuss any questions you have with your health care provider. ?Document Released: 08/31/2015 Document Revised: 04/23/2016 Document Reviewed: 06/05/2015 ?Elsevier Interactive Patient Education ? 2017 [employer]. ? ?Fall Prevention in the Home ?Falls can cause injuries. They can happen to people of all ages. There are many things you can do to make your home safe and to help prevent falls. ?What can I do on the outside of my home? ?Regularly fix the edges of walkways and driveways and fix any cracks. ?Remove anything that might make you trip as you walk through a door, such as a raised step or threshold. ?Trim any bushes or trees on the path to your home. ?Use bright outdoor lighting. ?Clear any walking paths of anything that might make someone trip, such as rocks or tools. ?Regularly check to see if handrails are loose or broken. Make sure that both sides of any steps have handrails. ?Any raised decks and porches should have guardrails on the edges. ?Have any leaves, snow, or ice cleared regularly. ?Use sand or salt on walking paths during winter. ?Clean up any spills in your garage right away. This includes oil or grease spills. ?What can I do in the bathroom? ?Use night lights. ?Install grab bars by the toilet and in the tub and shower. Do not use towel bars as grab bars. ?Use non-skid mats or decals in the tub or shower. ?If you need to sit down in the shower, use a plastic, non-slip stool. ?Keep the floor dry. Clean up any water that spills on the floor as soon as it happens. ?Remove soap buildup in the tub or shower regularly. ?Attach bath mats securely with double-sided  non-slip rug tape. ?Do not have throw rugs and other things on the floor that can make you trip. ?What can I do in the bedroom? ?Use night lights. ?Make sure that you have a light by your bed that is easy to reach. ?Do not use any sheets or blankets that are too big for your bed. They should not hang down onto the floor. ?Have a firm chair that has side arms. You can use this for support while you get dressed. ?Do not have throw rugs and other things on the floor that can make you trip. ?What can I do in the kitchen? ?Clean up any spills right away. ?Avoid walking on wet floors. ?Keep items that you use a lot in easy-to-reach places. ?If you need to reach something above you, use a strong step stool that has a grab bar. ?Keep electrical cords out of the way. ?Do not use floor polish or wax that makes floors slippery. If you must use wax, use non-skid floor wax. ?Do not have throw rugs and other things on the floor that can make you trip. ?What can I do with my stairs? ?Do not leave any items on the stairs. ?Make sure that there are handrails on both sides of the stairs and use them. Fix handrails that are broken or loose. Make sure that handrails are as long as the stairways. ?Check any carpeting  to make sure that it is firmly attached to the stairs. Fix any carpet that is loose or worn. ?Avoid having throw rugs at the top or bottom of the stairs. If you do have throw rugs, attach them to the floor with carpet tape. ?Make sure that you have a light switch at the top of the stairs and the bottom of the stairs. If you do not have them, ask someone to add them for you. ?What else can I do to help prevent falls? ?Wear shoes that: ?Do not have high heels. ?Have rubber bottoms. ?Are comfortable and fit you well. ?Are closed at the toe. Do not wear sandals. ?If you use a stepladder: ?Make sure that it is fully opened. Do not climb a closed stepladder. ?Make sure that both sides of the stepladder are locked into place. ?Ask  someone to hold it for you, if possible. ?Clearly mark and make sure that you can see: ?Any grab bars or handrails. ?First and last steps. ?Where the edge of each step is. ?Use tools that help you move aro

## 2021-12-13 ENCOUNTER — Encounter: Payer: Self-pay | Admitting: Cardiovascular Disease

## 2021-12-13 ENCOUNTER — Ambulatory Visit (INDEPENDENT_AMBULATORY_CARE_PROVIDER_SITE_OTHER): Payer: Medicare Other | Admitting: Cardiovascular Disease

## 2021-12-13 VITALS — BP 120/72 | HR 68 | Ht 73.5 in | Wt 187.4 lb

## 2021-12-13 DIAGNOSIS — E785 Hyperlipidemia, unspecified: Secondary | ICD-10-CM | POA: Diagnosis not present

## 2021-12-13 DIAGNOSIS — I2510 Atherosclerotic heart disease of native coronary artery without angina pectoris: Secondary | ICD-10-CM
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
## 2021-12-13 NOTE — Patient Instructions (Signed)
Medication Instructions:  ? ?Your physician recommends that you continue on your current medications as directed. Please refer to the Current Medication list given to you today. ? ?*If you need a refill on your cardiac medications before your next appointment, please call your pharmacy* ? ? ?Lab Work: ? ?TODAY--BMET, ALT, AND LIPIDS ? ?If you have labs (blood work) drawn today and your tests are completely normal, you will receive your results only by: ?MyChart Message (if you have MyChart) OR ?A paper copy in the mail ?If you have any lab test that is abnormal or we need to change your treatment, we will call you to review the results. ? ? ?Follow-Up: ?At [facility], you and your health needs are our priority.  As part of our continuing mission to provide you with exceptional heart care, we have created designated Provider Care Teams.  These Care Teams include your primary Cardiologist (physician) and Advanced Practice Providers (APPs -  Physician Assistants and Nurse Practitioners) who all work together to provide you with the care you need, when you need it. ? ?We recommend signing up for the patient portal called "MyChart".  Sign up information is provided on this After Visit Summary.  MyChart is used to connect with patients for Virtual Visits (Telemedicine).  Patients are able to view lab/test results, encounter notes, upcoming appointments, etc.  Non-urgent messages can be sent to your provider as well.   ?To learn more about what you can do with MyChart, go to [email or web address].   ? ?Your next appointment:   ?1 year(s) ? ?The format for your next appointment:   ?In Person ? ?Provider:   ?Mertie Moores, MD { ? ? ?Important Information About Sugar ? ? ? ? ? ? ?

## 2021-12-14 LAB — LIPID PANEL
Chol/HDL Ratio: 2 ratio (ref 0.0–5.0)
Cholesterol, Total: 98 mg/dL — ABNORMAL LOW (ref 100–199)
HDL: 50 mg/dL (ref >39)
LDL Chol Calc (NIH): 34 mg/dL (ref 0–99)
Triglycerides: 64 mg/dL (ref 0–149)
VLDL Cholesterol Cal: 14 mg/dL (ref 5–40)

## 2021-12-14 LAB — BASIC METABOLIC PANEL
BUN/Creatinine Ratio: 21 (ref 10–24)
BUN: 18 mg/dL (ref 8–27)
CO2: 21 mmol/L (ref 20–29)
Calcium: 9.6 mg/dL (ref 8.6–10.2)
Chloride: 102 mmol/L (ref 96–106)
Creatinine, Ser: 0.87 mg/dL (ref 0.76–1.27)
Glucose: 87 mg/dL (ref 70–99)
Potassium: 4.5 mmol/L (ref 3.5–5.2)
Sodium: 139 mmol/L (ref 134–144)
eGFR: 95 mL/min/{1.73_m2} (ref >59)

## 2021-12-14 LAB — ALT: ALT: 11 IU/L (ref 0–44)

## 2021-12-15 ENCOUNTER — Other Ambulatory Visit: Payer: Self-pay | Admitting: Family Medicine

## 2022-01-05 ENCOUNTER — Other Ambulatory Visit: Payer: Self-pay | Admitting: Family Medicine

## 2022-01-05 ENCOUNTER — Other Ambulatory Visit: Payer: Self-pay | Admitting: Cardiovascular Disease

## 2022-01-07 MED ORDER — CLOPIDOGREL BISULFATE 75 MG PO TABS: 75.0000 mg | ORAL_TABLET | Freq: Every day | ORAL | 3 refills | Status: DC

## 2022-01-07 NOTE — Addendum Note (Signed)
Addended by: Carter Kitten D on: 01/07/2022 09:45 AM   Modules accepted: Orders

## 2022-01-08 DIAGNOSIS — E119 Type 2 diabetes mellitus without complications: Secondary | ICD-10-CM | POA: Diagnosis not present

## 2022-01-23 DIAGNOSIS — I480 Paroxysmal atrial fibrillation: Secondary | ICD-10-CM | POA: Diagnosis not present

## 2022-01-23 DIAGNOSIS — E559 Vitamin D deficiency, unspecified: Secondary | ICD-10-CM | POA: Diagnosis not present

## 2022-01-23 DIAGNOSIS — I2510 Atherosclerotic heart disease of native coronary artery without angina pectoris: Secondary | ICD-10-CM | POA: Diagnosis not present

## 2022-01-23 DIAGNOSIS — F5101 Primary insomnia: Secondary | ICD-10-CM | POA: Diagnosis not present

## 2022-01-23 DIAGNOSIS — Z951 Presence of aortocoronary bypass graft: Secondary | ICD-10-CM | POA: Diagnosis not present

## 2022-01-23 DIAGNOSIS — E118 Type 2 diabetes mellitus with unspecified complications: Secondary | ICD-10-CM | POA: Diagnosis not present

## 2022-01-23 DIAGNOSIS — E782 Mixed hyperlipidemia: Secondary | ICD-10-CM | POA: Diagnosis not present

## 2022-01-23 DIAGNOSIS — G629 Polyneuropathy, unspecified: Secondary | ICD-10-CM | POA: Diagnosis not present

## 2022-01-23 DIAGNOSIS — I10 Essential (primary) hypertension: Secondary | ICD-10-CM | POA: Diagnosis not present

## 2022-02-14 DIAGNOSIS — E875 Hyperkalemia: Secondary | ICD-10-CM | POA: Diagnosis not present

## 2022-03-07 ENCOUNTER — Other Ambulatory Visit: Payer: Self-pay | Admitting: Family Medicine

## 2022-03-17 ENCOUNTER — Encounter: Payer: Self-pay | Admitting: Family Medicine

## 2022-03-26 ENCOUNTER — Encounter: Payer: Self-pay | Admitting: Internal Medicine

## 2022-03-29 ENCOUNTER — Other Ambulatory Visit: Payer: Self-pay | Admitting: Family Medicine

## 2022-04-10 ENCOUNTER — Encounter: Payer: Self-pay | Admitting: Internal Medicine

## 2022-04-10 ENCOUNTER — Telehealth: Payer: Self-pay | Admitting: *Deleted

## 2022-04-10 NOTE — Telephone Encounter (Signed)
Please call this patient to make an office visit with Dr.Pyrtle he is on a blood thinner-Plavix. (Anytime a patient answers "YES" they are taking blood thinners they need office visit-not a pre-visit)  Thank you, Alex Yang PV

## 2022-04-10 NOTE — Telephone Encounter (Signed)
PT has been scheduled for an OV 06/18/2022 9:30am

## 2022-04-18 ENCOUNTER — Other Ambulatory Visit: Payer: Self-pay | Admitting: Family Medicine

## 2022-05-15 ENCOUNTER — Encounter: Payer: Medicare Other | Admitting: Internal Medicine

## 2022-06-18 ENCOUNTER — Ambulatory Visit: Payer: Medicare Other | Admitting: Internal Medicine

## 2022-06-18 DIAGNOSIS — R21 Rash and other nonspecific skin eruption: Secondary | ICD-10-CM | POA: Diagnosis not present

## 2022-06-18 DIAGNOSIS — I10 Essential (primary) hypertension: Secondary | ICD-10-CM | POA: Diagnosis not present

## 2022-06-18 DIAGNOSIS — E785 Hyperlipidemia, unspecified: Secondary | ICD-10-CM | POA: Diagnosis not present

## 2022-06-18 DIAGNOSIS — Z23 Encounter for immunization: Secondary | ICD-10-CM | POA: Diagnosis not present

## 2022-06-18 DIAGNOSIS — Z5181 Encounter for therapeutic drug level monitoring: Secondary | ICD-10-CM | POA: Diagnosis not present

## 2022-06-18 DIAGNOSIS — E1169 Type 2 diabetes mellitus with other specified complication: Secondary | ICD-10-CM | POA: Diagnosis not present

## 2022-07-15 ENCOUNTER — Other Ambulatory Visit: Payer: Self-pay | Admitting: Family Medicine

## 2022-07-23 ENCOUNTER — Other Ambulatory Visit: Payer: Self-pay | Admitting: Family Medicine

## 2022-10-27 ENCOUNTER — Other Ambulatory Visit: Payer: Self-pay | Admitting: Family Medicine

## 2022-11-28 ENCOUNTER — Other Ambulatory Visit: Payer: Self-pay | Admitting: Family Medicine

## 2022-12-17 ENCOUNTER — Ambulatory Visit: Payer: Medicare Other

## 2023-01-27 ENCOUNTER — Ambulatory Visit: Payer: 59 | Admitting: Cardiovascular Disease

## 2023-02-23 ENCOUNTER — Encounter: Payer: Self-pay | Admitting: Cardiovascular Disease

## 2023-02-23 NOTE — Progress Notes (Signed)
   N show    This encounter was created in error - please disregard.  No show

## 2023-02-24 ENCOUNTER — Ambulatory Visit: Payer: 59 | Attending: Cardiovascular Disease | Admitting: Cardiovascular Disease

## 2023-03-11 DIAGNOSIS — Z0000 Encounter for general adult medical examination without abnormal findings: Secondary | ICD-10-CM | POA: Diagnosis not present

## 2023-03-11 DIAGNOSIS — E785 Hyperlipidemia, unspecified: Secondary | ICD-10-CM | POA: Diagnosis not present

## 2023-03-11 DIAGNOSIS — I10 Essential (primary) hypertension: Secondary | ICD-10-CM | POA: Diagnosis not present

## 2023-03-11 DIAGNOSIS — G4700 Insomnia, unspecified: Secondary | ICD-10-CM | POA: Diagnosis not present

## 2023-03-11 DIAGNOSIS — E1169 Type 2 diabetes mellitus with other specified complication: Secondary | ICD-10-CM | POA: Diagnosis not present

## 2023-03-11 DIAGNOSIS — E538 Deficiency of other specified B group vitamins: Secondary | ICD-10-CM | POA: Diagnosis not present

## 2023-05-15 ENCOUNTER — Other Ambulatory Visit: Payer: Self-pay | Admitting: Cardiovascular Disease

## 2023-05-21 ENCOUNTER — Encounter: Payer: Self-pay | Admitting: Cardiovascular Disease
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
## 2023-05-22 ENCOUNTER — Ambulatory Visit: Payer: 59 | Admitting: Cardiovascular Disease

## 2023-06-07 ENCOUNTER — Other Ambulatory Visit: Payer: Self-pay | Admitting: Cardiovascular Disease

## 2023-06-18 ENCOUNTER — Encounter: Payer: Self-pay | Admitting: Physician Assistant

## 2023-06-18 NOTE — Assessment & Plan Note (Addendum)
S/p NSTEMI in 07/2013 followed by CABG. Myoview in 04/2020 low risk. Recent LDL < 55. No recent chest pain to suggest angina. EKG shows no changes.  - Continue Lipitor 20 mg daily - Continue Plavix 75 mg daily - Continue nitroglycerin as needed

## 2023-06-18 NOTE — Assessment & Plan Note (Addendum)
40-59% RICA stenosis on carotid US in 07/2013. No follow up since.  -Arrange carotid dopplers.

## 2023-06-18 NOTE — Assessment & Plan Note (Addendum)
LDL 27 in 02/2023.  - Continue atorvastatin 20 mg daily

## 2023-06-19 ENCOUNTER — Encounter: Payer: Self-pay | Admitting: Physician Assistant

## 2023-06-19 ENCOUNTER — Ambulatory Visit: Payer: 59 | Attending: Cardiovascular Disease | Admitting: Physician Assistant

## 2023-06-19 VITALS — BP 100/60 | HR 58 | Ht 74.0 in | Wt 172.6 lb

## 2023-06-19 DIAGNOSIS — I6523 Occlusion and stenosis of bilateral carotid arteries: Secondary | ICD-10-CM

## 2023-06-19 DIAGNOSIS — I2510 Atherosclerotic heart disease of native coronary artery without angina pectoris: Secondary | ICD-10-CM

## 2023-06-19 DIAGNOSIS — I10 Essential (primary) hypertension: Secondary | ICD-10-CM

## 2023-06-19 DIAGNOSIS — G4700 Insomnia, unspecified: Secondary | ICD-10-CM | POA: Diagnosis not present

## 2023-06-19 DIAGNOSIS — E7800 Pure hypercholesterolemia, unspecified: Secondary | ICD-10-CM

## 2023-06-19 DIAGNOSIS — E1169 Type 2 diabetes mellitus with other specified complication: Secondary | ICD-10-CM | POA: Diagnosis not present

## 2023-06-19 DIAGNOSIS — E785 Hyperlipidemia, unspecified: Secondary | ICD-10-CM | POA: Diagnosis not present

## 2023-06-19 NOTE — Assessment & Plan Note (Signed)
Well-controlled. - Continue losartan 100 mg daily - Continue metoprolol tartrate 50 mg twice daily

## 2023-06-19 NOTE — Patient Instructions (Signed)
Medication Instructions:  Your physician recommends that you continue on your current medications as directed. Please refer to the Current Medication list given to you today.  *If you need a refill on your cardiac medications before your next appointment, please call your pharmacy*   Lab Work: None ordered  If you have labs (blood work) drawn today and your tests are completely normal, you will receive your results only by: [place] (if you have MyChart) OR A paper copy in the mail If you have any lab test that is abnormal or we need to change your treatment, we will call you to review the results.   Testing/Procedures: None ordered   Follow-Up: At [facility], you and your health needs are our priority.  As part of our continuing mission to provide you with exceptional heart care, we have created designated Provider Care Teams.  These Care Teams include your primary Cardiologist (physician) and Advanced Practice Providers (APPs -  Physician Assistants and Nurse Practitioners) who all work together to provide you with the care you need, when you need it.  We recommend signing up for the patient portal called "MyChart".  Sign up information is provided on this After Visit Summary.  MyChart is used to connect with patients for Virtual Visits (Telemedicine).  Patients are able to view lab/test results, encounter notes, upcoming appointments, etc.  Non-urgent messages can be sent to your provider as well.   To learn more about what you can do with MyChart, go to [email or web address].    Your next appointment:   1 year(s)  Provider:   Mertie Moores, MD     Other Instructions

## 2023-06-25 ENCOUNTER — Ambulatory Visit (HOSPITAL_COMMUNITY)
Admission: RE | Admit: 2023-06-25 | Payer: 59 | Source: Ambulatory Visit | Attending: Physician Assistant | Admitting: Physician Assistant

## 2023-09-24 ENCOUNTER — Encounter (HOSPITAL_COMMUNITY): Payer: Self-pay

## 2023-11-25 ENCOUNTER — Ambulatory Visit (HOSPITAL_COMMUNITY)

## 2023-11-25 ENCOUNTER — Encounter (HOSPITAL_COMMUNITY)

## 2023-12-14 ENCOUNTER — Ambulatory Visit (HOSPITAL_COMMUNITY): Admission: RE | Admit: 2023-12-14 | Source: Ambulatory Visit

## 2023-12-14 ENCOUNTER — Encounter (HOSPITAL_COMMUNITY): Payer: Self-pay

## 2024-02-15 ENCOUNTER — Emergency Department (HOSPITAL_COMMUNITY)

## 2024-02-15 ENCOUNTER — Emergency Department (HOSPITAL_COMMUNITY): Admission: EM | Admit: 2024-02-15 | Discharge: 2024-02-15 | Disposition: A | Discharge status: Home / Self Care

## 2024-02-15 ENCOUNTER — Encounter (HOSPITAL_COMMUNITY): Payer: Self-pay

## 2024-02-15 ENCOUNTER — Other Ambulatory Visit: Payer: Self-pay

## 2024-02-15 DIAGNOSIS — Z951 Presence of aortocoronary bypass graft: Secondary | ICD-10-CM | POA: Diagnosis not present

## 2024-02-15 DIAGNOSIS — I2510 Atherosclerotic heart disease of native coronary artery without angina pectoris: Secondary | ICD-10-CM | POA: Insufficient documentation

## 2024-02-15 DIAGNOSIS — R0789 Other chest pain: Secondary | ICD-10-CM | POA: Insufficient documentation

## 2024-02-15 DIAGNOSIS — Z7901 Long term (current) use of anticoagulants: Secondary | ICD-10-CM | POA: Diagnosis not present

## 2024-02-15 DIAGNOSIS — Z8679 Personal history of other diseases of the circulatory system: Secondary | ICD-10-CM

## 2024-02-15 LAB — CBC WITH DIFFERENTIAL/PLATELET
Abs Immature Granulocytes: 0.02 10*3/uL (ref 0.00–0.07)
Basophils Absolute: 0 10*3/uL (ref 0.0–0.1)
Basophils Relative: 1 %
Eosinophils Absolute: 0.4 10*3/uL (ref 0.0–0.5)
Eosinophils Relative: 4 %
HCT: 41.4 % (ref 39.0–52.0)
Hemoglobin: 13.4 g/dL (ref 13.0–17.0)
Immature Granulocytes: 0 %
Lymphocytes Relative: 26 %
Lymphs Abs: 2.2 10*3/uL (ref 0.7–4.0)
MCH: 30.5 pg (ref 26.0–34.0)
MCHC: 32.4 g/dL (ref 30.0–36.0)
MCV: 94.3 fL (ref 80.0–100.0)
Monocytes Absolute: 0.5 10*3/uL (ref 0.1–1.0)
Monocytes Relative: 6 %
Neutro Abs: 5.4 10*3/uL (ref 1.7–7.7)
Neutrophils Relative %: 63 %
Platelets: 265 10*3/uL (ref 150–400)
RBC: 4.39 MIL/uL (ref 4.22–5.81)
RDW: 14.5 % (ref 11.5–15.5)
WBC: 8.6 10*3/uL (ref 4.0–10.5)
nRBC: 0 % (ref 0.0–0.2)

## 2024-02-15 LAB — COMPREHENSIVE METABOLIC PANEL WITH GFR
ALT: 12 U/L (ref 0–44)
AST: 16 U/L (ref 15–41)
Albumin: 3.6 g/dL (ref 3.5–5.0)
Alkaline Phosphatase: 72 U/L (ref 38–126)
Anion gap: 9 (ref 5–15)
BUN: 14 mg/dL (ref 8–23)
CO2: 23 mmol/L (ref 22–32)
Calcium: 8.8 mg/dL — ABNORMAL LOW (ref 8.9–10.3)
Chloride: 104 mmol/L (ref 98–111)
Creatinine, Ser: 1.07 mg/dL (ref 0.61–1.24)
GFR, Estimated: >60 mL/min (ref >60)
Glucose, Bld: 161 mg/dL — ABNORMAL HIGH (ref 70–99)
Potassium: 4.1 mmol/L (ref 3.5–5.1)
Sodium: 136 mmol/L (ref 135–145)
Total Bilirubin: 0.3 mg/dL (ref 0.0–1.2)
Total Protein: 6.5 g/dL (ref 6.5–8.1)

## 2024-02-15 LAB — TROPONIN I (HIGH SENSITIVITY)
Troponin I (High Sensitivity): 4 ng/L (ref <18)
Troponin I (High Sensitivity): 3 ng/L (ref <18)

## 2024-02-15 LAB — CBG MONITORING, ED: Glucose-Capillary: 213 mg/dL — ABNORMAL HIGH (ref 70–99)

## 2024-02-15 NOTE — ED Triage Notes (Signed)
 Pt arrived via POV c/o generalized chest pain with radiation to his back that began this morning. Pts spouse reports the Pt is full of gas. Pt does present endorsing significant cardiac Hx.

## 2024-02-15 NOTE — Discharge Instructions (Signed)
 Continue your medications as prescribed.  Follow-up with your primary care doctor and cardiologist.  Return to the ER for any new or worsening symptoms.

## 2024-02-15 NOTE — ED Provider Notes (Signed)
  EMERGENCY DEPARTMENT AT [facility] Provider Note   CSN: [id number] Arrival date & time: 02/15/24  1416     Patient presents with: Chest Pain   Alex Yang is a 68 y.o. male.   68 year old male presents for evaluation of chest pain.  Patient states he woke up this morning and had some chest tightness radiating across his bilateral chest to the shoulders and back.  States that when away fairly quickly.  He states he has a history of CABG in the past in 2017 and just wanted him to get it checked.  Denies any lightheadedness, nausea, diaphoresis, shortness of breath, or any other symptoms or concerns at this time.  He states his symptoms have resolved.   Chest Pain Associated symptoms: no abdominal pain, no back pain, no cough, no fever, no palpitations, no shortness of breath and no vomiting        Prior to Admission medications   Medication Sig Start Date End Date Taking? Authorizing Provider  ACCU-CHEK AVIVA PLUS test strip USE  TO CHECK BLOOD SUGAR THREE TIMES DAILY 07/02/21   McGowen, Aleene VEAR, MD  albuterol  (PROVENTIL  HFA;VENTOLIN  HFA) 108 (90 Base) MCG/ACT inhaler Inhale 2 puffs into the lungs every 4 (four) hours as needed for wheezing or shortness of breath. 09/30/17   McGowen, Aleene VEAR, MD  Alcohol  Swabs  (B-D SINGLE USE SWABS  REGULAR) PADS USE TO CHECK BLOOD SUGAR THREE TIMES DAILY 02/20/21   McGowen, Aleene VEAR, MD  atorvastatin  (LIPITOR) 20 MG tablet Take 20 mg by mouth daily.    [provider]  Blood Glucose Calibration (ACCU-CHEK AVIVA) SOLN 1 Bottle by In Vitro route as needed. 09/30/17   McGowen, Aleene VEAR, MD  Blood Glucose Monitoring Suppl (TRUE METRIX METER) w/Device KIT  02/14/21   [provider]  citalopram  (CELEXA ) 20 MG tablet TAKE 1 TABLET EVERY DAY 05/09/21   McGowen, Aleene VEAR, MD  clopidogrel  (PLAVIX ) 75 MG tablet TAKE 1 TABLET BY MOUTH EVERY DAY WITH BREAKFAST 06/09/23   Nahser, Aleene PARAS, MD  Insulin  Pen Needle (B-D  ULTRAFINE III SHORT PEN) 31G X 8 MM MISC USE TO INJECT VICTOZA  05/14/21   McGowen, Aleene VEAR, MD  JARDIANCE  10 MG TABS tablet TAKE 1 TABLET EVERY DAY 09/17/21   McGowen, Aleene VEAR, MD  losartan  (COZAAR ) 100 MG tablet TAKE 1 TABLET BY MOUTH EVERY DAY 01/06/22   McGowen, Aleene VEAR, MD  metFORMIN  (GLUCOPHAGE ) 1000 MG tablet Take 1 tablet (1,000 mg total) by mouth 2 (two) times daily with a meal. 05/14/21   McGowen, Aleene VEAR, MD  metoprolol  tartrate (LOPRESSOR ) 50 MG tablet Take 1 tablet (50 mg total) by mouth 2 (two) times daily. OFFICE VISIT NEEDED FOR FURTHER REFILLS 11/25/21   McGowen, Aleene VEAR, MD  nitroGLYCERIN  (NITROSTAT ) 0.4 MG SL tablet Place 1 tablet (0.4 mg total) under the tongue every 5 (five) minutes as needed for chest pain. 04/15/20   Cleotilde Rogue, MD  OZEMPIC, 2 MG/DOSE, 8 MG/3ML SOPN Inject 3 mg into the skin once a week.    [provider]  traZODone  (DESYREL ) 50 MG tablet TAKE 1 TO 2 TABLETS AT BEDTIME AS NEEDED FOR INSOMNIA 07/02/21   McGowen, Aleene VEAR, MD  TRUEplus Lancets 33G MISC 1 Stick by Does not apply route 2 (two) times daily. 02/13/21   McGowen, Aleene VEAR, MD    Allergies: Lorazepam , Oxycodone , and Pioglitazone     Review of Systems  Constitutional:  Negative for chills and fever.  HENT:  Negative for ear pain and sore throat.   Eyes:  Negative for pain and visual disturbance.  Respiratory:  Negative for cough and shortness of breath.   Cardiovascular:  Positive for chest pain. Negative for palpitations.  Gastrointestinal:  Negative for abdominal pain and vomiting.  Genitourinary:  Negative for dysuria and hematuria.  Musculoskeletal:  Negative for arthralgias and back pain.  Skin:  Negative for color change and rash.  Neurological:  Negative for seizures and syncope.  All other systems reviewed and are negative.   Updated Vital Signs BP 138/85   Pulse (!) 58   Temp (!) 97.5 F (36.4 C)   Resp 17   Ht 6' 2 (1.88 m)   Wt 78.3 kg   SpO2 98%   BMI 22.16 kg/m    Physical Exam Vitals and nursing note reviewed.  Constitutional:      General: He is not in acute distress.    Appearance: He is well-developed. He is not ill-appearing.  HENT:     Head: Normocephalic and atraumatic.   Eyes:     Conjunctiva/sclera: Conjunctivae normal.    Cardiovascular:     Rate and Rhythm: Normal rate and regular rhythm.     Heart sounds: No murmur heard. Pulmonary:     Effort: Pulmonary effort is normal. No respiratory distress.     Breath sounds: Normal breath sounds.  Abdominal:     Palpations: Abdomen is soft.     Tenderness: There is no abdominal tenderness.   Musculoskeletal:        General: No swelling.     Cervical back: Neck supple.   Skin:    General: Skin is warm and dry.     Capillary Refill: Capillary refill takes less than 2 seconds.   Neurological:     Mental Status: He is alert.   Psychiatric:        Mood and Affect: Mood normal.     (all labs ordered are listed, but only abnormal results are displayed) Labs Reviewed  COMPREHENSIVE METABOLIC PANEL WITH GFR - Abnormal; Notable for the following components:      Result Value   Glucose, Bld 161 (*)    Calcium  8.8 (*)    All other components within normal limits  CBG MONITORING, ED - Abnormal; Notable for the following components:   Glucose-Capillary 213 (*)    All other components within normal limits  CBC WITH DIFFERENTIAL/PLATELET  TROPONIN I (HIGH SENSITIVITY)  TROPONIN I (HIGH SENSITIVITY)    EKG: EKG Interpretation Date/Time:  Monday February 15 2024 15:10:36 EDT Ventricular Rate:  66 PR Interval:  161 QRS Duration:  105 QT Interval:  429 QTC Calculation: 450 R Axis:   94  Text Interpretation: Sinus rhythm Right axis deviation no STEMI no acute change from earlier today Confirmed by Gennaro Bouchard ([id number]) on 02/15/2024 3:13:59 PM  Radiology: DG Chest 2 View Result Date: 02/15/2024 CLINICAL DATA:  Chest pain EXAM: CHEST - 2 VIEW COMPARISON:  04/15/2020 FINDINGS:  Sternotomy. No acute airspace disease or effusion. Scarring or atelectasis at the lingula and left base. Normal cardiac size. No pneumothorax. Postsurgical changes of the left shoulder. IMPRESSION: No active cardiopulmonary disease. Scarring or atelectasis at the lingula and left base. Electronically Signed   By: Luke Bun M.D.   On: 02/15/2024 15:20     Procedures   Medications Ordered in the ED - No data to display  Medical Decision Making Medical Decision Making Nursing notes are reviewed. Differential diagnosis for this patient would include but not limited to: ACS, anxiety, musculoskeletal chest pain, GERD, pneumothorax, other  Records reviewed: Outpatient records reviewed and patient last seen in primary care office in January 2025 for management of chronic hypertension  Studies: Chest x-ray interpreted independently radiology and patient has history of CABG with no other acute abnormality  EKG interpretation: Interpreted by me in the absence of cardiology shows sinus rhythm, no STEMI, no acute change compared to previous  Cardiac monitor interpretation: Sinus bradycardia, no ectopy  Emergency Department Course:  Vital signs and pulse oximetry are reviewed, evaluated by myself and found to be within normal limits prior to final disposition. Findings of laboratory testing and medical imaging are discussed with patient and family that is available. Patient agrees with the medical care plan as follows:  Patient is AU Zamine unremarkable.  Troponin is negative x 2.  Vitals are remained stable and his pain has been gone since earlier this morning.  I discussed with him to stay on his medications as prescribed and to use Tylenol  as needed for pain pain advised.  With primary care doctor and cardiology and he will plan to follow-up with them.  Vies return for new or worsening symptoms.  He feels comfortable being discharged home.   Problems  Addressed: Atypical chest pain: undiagnosed new problem with uncertain prognosis History of CAD (coronary artery disease): chronic illness or injury  Amount and/or Complexity of Data Reviewed External Data Reviewed: notes. Labs: ordered. Decision-making details documented in ED Course. Radiology: ordered and independent interpretation performed. Decision-making details documented in ED Course. ECG/medicine tests: ordered and independent interpretation performed. Decision-making details documented in ED Course.  Risk OTC drugs. Prescription drug management.    Final diagnoses:  Atypical chest pain  History of CAD (coronary artery disease)    ED Discharge Orders     None          Gennaro Duwaine CROME, DO 02/15/24 2148
